# Patient Record
Sex: Female | Born: 1965 | Race: Black or African American | Hispanic: No | Marital: Married | Smoking: Never smoker
Health system: Southern US, Community
[De-identification: ages and names within clinical notes are randomized; demographics above are authoritative.]

## PROBLEM LIST (undated history)

## (undated) ENCOUNTER — Ambulatory Visit: Admission: EM | Payer: Federal, State, Local not specified - PPO | Source: Home / Self Care

## (undated) DIAGNOSIS — J45909 Unspecified asthma, uncomplicated: Secondary | ICD-10-CM

## (undated) DIAGNOSIS — Z91048 Other nonmedicinal substance allergy status: Secondary | ICD-10-CM

## (undated) DIAGNOSIS — L2089 Other atopic dermatitis: Secondary | ICD-10-CM

## (undated) DIAGNOSIS — K589 Irritable bowel syndrome without diarrhea: Secondary | ICD-10-CM

## (undated) DIAGNOSIS — M797 Fibromyalgia: Secondary | ICD-10-CM

## (undated) DIAGNOSIS — M1990 Unspecified osteoarthritis, unspecified site: Secondary | ICD-10-CM

## (undated) DIAGNOSIS — R609 Edema, unspecified: Secondary | ICD-10-CM

## (undated) HISTORY — DX: Other atopic dermatitis: L20.89

## (undated) HISTORY — DX: Unspecified osteoarthritis, unspecified site: M19.90

## (undated) HISTORY — PX: TONSILLECTOMY AND ADENOIDECTOMY: SUR1326

## (undated) HISTORY — DX: Unspecified asthma, uncomplicated: J45.909

## (undated) HISTORY — PX: WISDOM TOOTH EXTRACTION: SHX21

## (undated) HISTORY — DX: Edema, unspecified: R60.9

## (undated) HISTORY — DX: Irritable bowel syndrome, unspecified: K58.9

## (undated) HISTORY — DX: Morbid (severe) obesity due to excess calories: E66.01

## (undated) HISTORY — DX: Fibromyalgia: M79.7

---

## 1997-10-30 ENCOUNTER — Inpatient Hospital Stay (HOSPITAL_COMMUNITY): Admission: AD | Admit: 1997-10-30 | Discharge: 1997-11-01 | Payer: Self-pay | Admitting: Obstetrics and Gynecology

## 1998-04-12 ENCOUNTER — Encounter: Admission: RE | Admit: 1998-04-12 | Discharge: 1998-07-11 | Payer: Self-pay | Admitting: Orthopedic Surgery

## 1999-05-10 ENCOUNTER — Other Ambulatory Visit: Admission: RE | Admit: 1999-05-10 | Discharge: 1999-05-10 | Payer: Self-pay | Admitting: Gynecology

## 2000-04-09 ENCOUNTER — Other Ambulatory Visit: Admission: RE | Admit: 2000-04-09 | Discharge: 2000-04-09 | Payer: Self-pay | Admitting: Gynecology

## 2001-05-02 ENCOUNTER — Emergency Department (HOSPITAL_COMMUNITY): Admission: EM | Admit: 2001-05-02 | Discharge: 2001-05-02 | Payer: Self-pay | Admitting: Emergency Medicine

## 2008-04-15 DIAGNOSIS — E559 Vitamin D deficiency, unspecified: Secondary | ICD-10-CM | POA: Insufficient documentation

## 2008-09-16 ENCOUNTER — Ambulatory Visit: Payer: Self-pay | Admitting: Family Medicine

## 2008-10-13 ENCOUNTER — Ambulatory Visit: Payer: Self-pay | Admitting: Family Medicine

## 2008-10-26 ENCOUNTER — Telehealth: Payer: Self-pay | Admitting: Family Medicine

## 2009-03-24 ENCOUNTER — Ambulatory Visit: Payer: Self-pay | Admitting: Family Medicine

## 2009-03-24 LAB — CONVERTED CEMR LAB
Nitrite: NEGATIVE
Urobilinogen, UA: 0.2

## 2009-03-28 ENCOUNTER — Encounter (INDEPENDENT_AMBULATORY_CARE_PROVIDER_SITE_OTHER): Payer: Self-pay | Admitting: *Deleted

## 2009-04-07 ENCOUNTER — Ambulatory Visit: Payer: Self-pay | Admitting: Family Medicine

## 2009-08-07 ENCOUNTER — Ambulatory Visit: Payer: Self-pay | Admitting: Emergency Medicine

## 2009-09-21 ENCOUNTER — Ambulatory Visit: Payer: Self-pay | Admitting: Obstetrics & Gynecology

## 2009-10-20 ENCOUNTER — Encounter: Admission: RE | Admit: 2009-10-20 | Discharge: 2009-10-20 | Payer: Self-pay | Admitting: Obstetrics & Gynecology

## 2009-11-07 ENCOUNTER — Ambulatory Visit: Payer: Self-pay | Admitting: Emergency Medicine

## 2009-11-08 ENCOUNTER — Ambulatory Visit: Payer: Self-pay | Admitting: Obstetrics & Gynecology

## 2010-02-09 ENCOUNTER — Ambulatory Visit: Payer: Self-pay | Admitting: Emergency Medicine

## 2010-02-09 DIAGNOSIS — L2089 Other atopic dermatitis: Secondary | ICD-10-CM

## 2010-02-09 HISTORY — DX: Other atopic dermatitis: L20.89

## 2010-03-12 ENCOUNTER — Encounter: Payer: Self-pay | Admitting: Obstetrics & Gynecology

## 2010-03-13 ENCOUNTER — Ambulatory Visit
Admission: RE | Admit: 2010-03-13 | Discharge: 2010-03-13 | Payer: Self-pay | Source: Home / Self Care | Admitting: Family Medicine

## 2010-03-22 ENCOUNTER — Ambulatory Visit (INDEPENDENT_AMBULATORY_CARE_PROVIDER_SITE_OTHER): Payer: Self-pay | Admitting: Emergency Medicine

## 2010-03-22 ENCOUNTER — Encounter: Payer: Self-pay | Admitting: Emergency Medicine

## 2010-03-22 DIAGNOSIS — R609 Edema, unspecified: Secondary | ICD-10-CM

## 2010-03-22 HISTORY — DX: Edema, unspecified: R60.9

## 2010-03-23 ENCOUNTER — Encounter: Payer: Self-pay | Admitting: Emergency Medicine

## 2010-03-23 LAB — CONVERTED CEMR LAB
BUN: 7 mg/dL (ref 6–23)
Glucose, Bld: 160 mg/dL — ABNORMAL HIGH (ref 70–99)
Potassium: 4.2 meq/L (ref 3.5–5.3)
Sodium: 138 meq/L (ref 135–145)

## 2010-03-23 NOTE — Letter (Signed)
Summary: New Patient letter  [facility]  [address] [place], [place] [address]   Phone: [phone]  Fax: [phone]       03/28/2009 MRN: [id number]  [facility] [address] [place], [place]  [address]  Dear Ms. Mayford Knife,  Welcome to the Gastroenterology Division at [facility].    You are scheduled to see Dr.  Marina Goodell on 05-16-09 at 2:45pm on the 3rd floor at [facility], [address]  We ask that you try to arrive at our office 15 minutes prior to your appointment time to allow for check-in.  We would like you to complete the enclosed self-administered evaluation form prior to your visit and bring it with you on the day of your appointment.  We will review it with you.  Also, please bring a complete list of all your medications or, if you prefer, bring the medication bottles and we will list them.  Please bring your insurance card so that we may make a copy of it.  If your insurance requires a referral to see a specialist, please bring your referral form from your primary care physician.  Co-payments are due at the time of your visit and may be paid by cash, check or credit card.     Your office visit will consist of a consult with your physician (includes a physical exam), any laboratory testing he/she may order, scheduling of any necessary diagnostic testing (e.g. x-ray, ultrasound, CT-scan), and scheduling of a procedure (e.g. Endoscopy, Colonoscopy) if required.  Please allow enough time on your schedule to allow for any/all of these possibilities.    If you cannot keep your appointment, please call [phone] to cancel or reschedule prior to your appointment date.  This allows [place] the opportunity to schedule an appointment for another patient in need of care.  If you do not cancel or reschedule by 5 p.m. the business day prior to your appointment date, you will be charged a $50.00 late cancellation/no-show fee.    Thank you for  choosing [facility] for your medical needs.  We appreciate the opportunity to care for you.  Please visit [place] at our website  to learn more about our practice.                     Sincerely,                                                             The Gastroenterology Division

## 2010-03-23 NOTE — Letter (Signed)
Summary: MEDICATION PRESCRIPTION POLICY  MEDICATION PRESCRIPTION POLICY   Imported By: Dannette Barbara 11/07/2009 15:18:43  _____________________________________________________________________  External Attachment:    Type:   Image     Comment:   External Document

## 2010-03-23 NOTE — Assessment & Plan Note (Signed)
Summary: SWOLLEN FEET/TJ   Vital Signs:  Patient Profile:   45 Years Old Female CC:      edematous feet, itching, rash to face x last night Height:     62 inches Weight:      245 pounds O2 Sat:      100 % O2 treatment:    Room Air Temp:     98.4 degrees F oral Pulse rate:   110 / minute Resp:     16 per minute BP sitting:   138 / 93  (left arm) Cuff size:   large  Pt. in pain?   no  Vitals Entered By: Lajean Saver RN (February 09, 2010 5:21 PM)                   Updated Prior Medication List: NEURONTIN 300 MG CAPS (GABAPENTIN) 2 tabs by mouth once daily FLEXERIL 10 MG TABS (CYCLOBENZAPRINE HCL)  FLONASE 50 MCG/ACT SUSP (FLUTICASONE PROPIONATE)  ZYRTEC-D ALLERGY & CONGESTION 5-120 MG XR12H-TAB (CETIRIZINE-PSEUDOEPHEDRINE) 1 tab by mouth two times a day for 3-5 days OXYCODONE HCL 15 MG TABS (OXYCODONE HCL) 4 xs daily  Current Allergies (reviewed today): ! * MOLD, RAGWEEDHistory of Present Illness Chief Complaint: edematous feet, itching, rash to face x last night History of Present Illness: 44yo AAF c/o bilateral feet swelling for a few days, itching whole body, and red rash on face x1 day.  She has a new perfume, and also started Oxycodone last week per her pain med physician.  She was on Hydrocodone prior to that, but it wasn't helping for her hip pain and fibromyalgia.  No SOB or difficulty breathing, no CP or orthopnea.  No other swelling other than feet.  Benedryl help a little bit.  She has also noticed a metallic taste in her mouth, but admits to chronic sinus problems.    REVIEW OF SYSTEMS Constitutional Symptoms      Denies fever, chills, night sweats, weight loss, weight gain, and fatigue.  Eyes       Denies change in vision, eye pain, eye discharge, glasses, contact lenses, and eye surgery. Ear/Nose/Throat/Mouth       Denies hearing loss/aids, change in hearing, ear pain, ear discharge, dizziness, frequent runny nose, frequent nose bleeds, sinus problems, sore  throat, hoarseness, and tooth pain or bleeding.      Comments: metal taste in mouth Respiratory       Denies dry cough, productive cough, wheezing, shortness of breath, asthma, bronchitis, and emphysema/COPD.  Cardiovascular       Denies murmurs, chest pain, and tires easily with exhertion.    Gastrointestinal       Denies stomach pain, nausea/vomiting, diarrhea, constipation, blood in bowel movements, and indigestion. Genitourniary       Denies painful urination, kidney stones, and loss of urinary control. Neurological       Denies paralysis, seizures, and fainting/blackouts. Musculoskeletal       Complains of swelling.      Denies muscle pain, joint pain, joint stiffness, decreased range of motion, redness, muscle weakness, and gout.      Comments: feet/ankles Skin       Denies bruising, unusual mles/lumps or sores, and hair/skin or nail changes.      Comments: face Psych       Denies mood changes, temper/anger issues, anxiety/stress, speech problems, depression, and sleep problems. Other Comments: Patient c/o swelling feet, metal taste in mouth, itching all over with small rash on her face x last  night. SHe has taken benadryl without much relief. She started taking oxycodone 1 week ago.   Past History:  Past Medical History: Reviewed history from 09/16/2008 and no changes required. Chronic Fatigue Fibromyalgia  Past Surgical History: Reviewed history from 11/07/2009 and no changes required. Caesarean section Tonsillectomy  Family History: Reviewed history from 09/16/2008 and no changes required. Family History Diabetes 1st degree relative  Social History: Married Never Smoked Alcohol use-no Drug use-no Regular exercise-yes 2 children Physical Exam General appearance: well developed, obese, no acute distress Ears: normal, no lesions or deformities Nasal: clear discharge Oral/Pharynx: clear post nasal drip, no swelling, OP patent, no erythema Chest/Lungs: no rales,  wheezes, or rhonchi bilateral, breath sounds equal without effort Heart: regular rate and  rhythm, no murmur Extremities: mild non-pitting edema bilaterally to ankles, no tenderness, no other swelling seen, however with body habitus sometimes the visualization becomes difficult to differentiate Skin: mild red small hives around both ears and a few excorations on face MSE: oriented to time, place, and person Assessment New Problems: DERMATITIS, ATOPIC (ICD-691.8)  Possible allergic reaction to new perfume, vs possible reaction to the oxycodone  Plan New Medications/Changes: PREDNISONE (PAK) 10 MG TABS (PREDNISONE) 6 day pack, use as directed  #QS x 0, 02/09/2010, Hoyt Koch MD  New Orders: Est. Patient Level III [phone] Planning Comments:   Rx for Prednisone 6 day pack.  Continue the benedryl.  I have advised her to stop using the perfume and instead use hypoallergenic soaps and lotions.  Mild 1% OTC cortisone daily if needed this week for itching.  If not improving, should follow up with her pain mgmt doctor to discuss switching off of Oxy.  The metallic taste is likely due to the post-nasal drip and should not be concerning unless it continues or worsens.  Use compression stockings to decrease swelling, and elevate feet whenever possible.  If SOB or any difficulty breathing or worsening of symptoms, go to ER or call EMS.   The patient and/or caregiver has been counseled thoroughly with regard to medications prescribed including dosage, schedule, interactions, rationale for use, and possible side effects and they verbalize understanding.  Diagnoses and expected course of recovery discussed and will return if not improved as expected or if the condition worsens. Patient and/or caregiver verbalized understanding.  Prescriptions: PREDNISONE (PAK) 10 MG TABS (PREDNISONE) 6 day pack, use as directed  #QS x 0   Entered and Authorized by:   Hoyt Koch MD   Signed by:   Hoyt Koch MD on 02/09/2010   Method used:   Print then Give to Patient   RxID:   [id number]   Orders Added: 1)  Est. Patient Level III [[address]]

## 2010-03-23 NOTE — Assessment & Plan Note (Signed)
Summary: RASH ON BACK AND RT LEG/WSE Room 5   Vital Signs:  Patient Profile:   45 Years Old Female CC:      Fatigue, ear pain, cough, body aches, rash on lower back x 1 wk rt leg x 2 days Height:     62 inches Weight:      245 pounds O2 Sat:      100 % O2 treatment:    Room Air Temp:     99.1 degrees F oral Pulse rate:   96 / minute Pulse rhythm:   regular Resp:     16 per minute BP sitting:   147 / 89  (left arm) Cuff size:   large  Vitals Entered By: Emilio Math (March 13, 2010 5:46 PM)                  Current Allergies (reviewed today): ! * MOLD, RAGWEEDHistory of Present Illness Chief Complaint: Fatigue, ear pain, cough, body aches, rash on lower back x 1 wk rt leg x 2 days History of Present Illness:  Subjective: Patient complains of URI symptoms that started about a week ago with sinus congestion, mild sore throat, and cough.  Cough is worse at night.  She also complains of a small pruritic lesion on her back that is now healing, and a similar but smaller lesion on her right anterior thigh that itches but seems to be healing.    No pleuritic pain No wheezing + nasal congestion + post-nasal drainage No sinus pain/pressure No itchy/red eyes No earache but ears feel clogged No hemoptysis No SOB No fever, + chills No nausea No vomiting No abdominal pain No diarrhea + fatigue No myalgias No headache Used OTC meds without relief   Current Meds FLEXERIL 10 MG TABS (CYCLOBENZAPRINE HCL)  HYDROCODONE-ACETAMINOPHEN 5-500 MG TABS (HYDROCODONE-ACETAMINOPHEN)  MOBIC 15 MG TABS (MELOXICAM)  AZITHROMYCIN 250 MG TABS (AZITHROMYCIN) Two tabs by mouth on day 1, then 1 tab daily on days 2 through 5 (Rx void after 03/21/10) BENZONATATE 200 MG CAPS (BENZONATATE) One by mouth hs as needed cough TRIAMCINOLONE ACETONIDE 0.1 % CREA (TRIAMCINOLONE ACETONIDE) Apply thin layer to affected area bid  REVIEW OF SYSTEMS Constitutional Symptoms      Denies fever, chills, night  sweats, weight loss, weight gain, and fatigue.  Eyes       Denies change in vision, eye pain, eye discharge, glasses, contact lenses, and eye surgery. Ear/Nose/Throat/Mouth       Complains of ear pain and sinus problems.      Denies hearing loss/aids, change in hearing, ear discharge, dizziness, frequent runny nose, frequent nose bleeds, sore throat, hoarseness, and tooth pain or bleeding.  Respiratory       Complains of dry cough.      Denies productive cough, wheezing, shortness of breath, asthma, bronchitis, and emphysema/COPD.  Cardiovascular       Denies murmurs, chest pain, and tires easily with exhertion.    Gastrointestinal       Denies stomach pain, nausea/vomiting, diarrhea, constipation, blood in bowel movements, and indigestion. Genitourniary       Denies painful urination, kidney stones, and loss of urinary control. Neurological       Denies paralysis, seizures, and fainting/blackouts. Musculoskeletal       Complains of muscle pain and joint pain.      Denies joint stiffness, decreased range of motion, redness, swelling, muscle weakness, and gout.  Skin       Denies bruising, unusual mles/lumps  or sores, and hair/skin or nail changes.  Psych       Denies mood changes, temper/anger issues, anxiety/stress, speech problems, depression, and sleep problems.  Past History:  Past Medical History: Reviewed history from 09/16/2008 and no changes required. Chronic Fatigue Fibromyalgia  Past Surgical History: Reviewed history from 11/07/2009 and no changes required. Caesarean section Tonsillectomy  Family History: Reviewed history from 09/16/2008 and no changes required. Family History Diabetes 1st degree relative Father, High Chol  Social History: Reviewed history from 02/09/2010 and no changes required. Married Never Smoked Alcohol use-no Drug use-no Regular exercise-yes 2 children   Objective:  Appearance:  Patient is obest but otherwise appears healthy, stated  age, and in no acute distress  Eyes:  Pupils are equal, round, and reactive to light and accomdation.  Extraocular movement is intact.  Conjunctivae are not inflamed.  Ears:  Canals normal.  Tympanic membranes normal.   Nose:  Normal septum.  Normal turbinates, mildly congested.    No sinus tenderness present.  Pharynx:  Normal  Neck:  Supple.  Slightly tender shotty  posterior nodes are palpated bilaterally.  Lungs:  Clear to auscultation.  Breath sounds are equal.  Heart:  Regular rate and rhythm without murmurs, rubs, or gallops.  Abdomen:  Nontender without masses or hepatosplenomegaly.  Bowel sounds are present.  No CVA or flank tenderness.  Extremities:  Trace edema Skin:  On the mid lower back is a healing excoriation about 5mm by 1.5cm, not infected.  On the right anterior thigh is a 5mm dia healing excoriation without evidence infection.  Both lesions appear non-specific. Assessment New Problems: SKIN LESION (ICD-709.9) RESPIRATORY DISORDER, ACUTE (ICD-465.9)  NO EVIDENCE BACTERIAL INFECTION TODAY.  Plan New Medications/Changes: TRIAMCINOLONE ACETONIDE 0.1 % CREA (TRIAMCINOLONE ACETONIDE) Apply thin layer to affected area bid  #15gm x 0, 03/13/2010, Donna Christen MD BENZONATATE 200 MG CAPS (BENZONATATE) One by mouth hs as needed cough  #12 x 0, 03/13/2010, Donna Christen MD AZITHROMYCIN 250 MG TABS (AZITHROMYCIN) Two tabs by mouth on day 1, then 1 tab daily on days 2 through 5 (Rx void after 03/21/10)  #6 tabs x 0, 03/13/2010, Donna Christen MD  New Orders: Est. Patient Level III [[address]] Pulse Oximetry (single measurment) [60[address]] Planning Comments:   Treat symptomatically for now:  Increase fluid intake, begin expectorant/decongestant, cough suppressant at bedtime.  If fever/chills/sweats persist, or if not improving 5 days begin Z-pack (given Rx to hold).  Followup with PCP if not improving 7 to 10 days.   Rx for triamcinolone cream for 2 skin lesions; if lesions persist or new  ones occur, recommend follow-up with dermatologist.   The patient and/or caregiver has been counseled thoroughly with regard to medications prescribed including dosage, schedule, interactions, rationale for use, and possible side effects and they verbalize understanding.  Diagnoses and expected course of recovery discussed and will return if not improved as expected or if the condition worsens. Patient and/or caregiver verbalized understanding.  Prescriptions: TRIAMCINOLONE ACETONIDE 0.1 % CREA (TRIAMCINOLONE ACETONIDE) Apply thin layer to affected area bid  #15gm x 0   Entered and Authorized by:   Donna Christen MD   Signed by:   Donna Christen MD on 03/13/2010   Method used:   Print then Give to Patient   RxID:   [id number] BENZONATATE 200 MG CAPS (BENZONATATE) One by mouth hs as needed cough  #12 x 0   Entered and Authorized by:   Donna Christen MD   Signed by:   Jeannett Senior  Mertie Haslem MD on 03/13/2010   Method used:   Print then Give to Patient   RxID:   [id number] AZITHROMYCIN 250 MG TABS (AZITHROMYCIN) Two tabs by mouth on day 1, then 1 tab daily on days 2 through 5 (Rx void after 03/21/10)  #6 tabs x 0   Entered and Authorized by:   Donna Christen MD   Signed by:   Donna Christen MD on 03/13/2010   Method used:   Print then Give to Patient   RxID:   [id number]   Patient Instructions: 1)  Take Mucinex D (guaifenesin with decongestant) twice daily for congestion. 2)  Increase fluid intake, rest. 3)  May use Afrin nasal spray (or generic oxymetazoline) twice daily for about 5 days.  Also recommend using saline nasal spray several times daily and/or saline nasal irrigation. 4)  Use Vicks at night. 5)  Begin Azithromycin if not improved about 5 days or if persistent fever develops 6)  Followup with family doctor if not improving 7 to 10 days.  Orders Added: 1)  Est. Patient Level III [[address]] 2)  Pulse Oximetry (single measurment) [13244]

## 2010-03-23 NOTE — Assessment & Plan Note (Signed)
Summary: SINUS,BACK PAIN/TM   Vital Signs:  Patient Profile:   45 Years Old Female CC:      bilateral ear pain, sinus congestion X 1 week ,  back pain Height:     62 inches Weight:      237 pounds O2 Sat:      99 % O2 treatment:    Room Air Temp:     97.1 degrees F oral Pulse rate:   97 / minute Resp:     16 per minute BP sitting:   133 / 89  (right arm) Cuff size:   large  Pt. in pain?   yes    Location:   lower back    Intensity:   9    Type:       sharp  Vitals Entered By: Lajean Saver RN (August 07, 2009 4:35 PM)                   Updated Prior Medication List: NEURONTIN 300 MG CAPS (GABAPENTIN) 2 tabs by mouth once daily TRAMADOL HCL 50 MG TABS (TRAMADOL HCL) 2 tabs by mouth once daily CELEBREX 200 MG CAPS (CELECOXIB) 1 tab by mouth once daily HYDROCODONE-ACETAMINOPHEN 5-500 MG TABS (HYDROCODONE-ACETAMINOPHEN) 2 tab by mouth once daily  Current Allergies (reviewed today): No known allergies History of Present Illness Chief Complaint: bilateral ear pain, sinus congestion X 1 week ,  back pain History of Present Illness: 45yo AAF with above symptoms.  Her daughter has felt sick too.  R>L ear pain, sinus congestion.  no fever.  Also had yard sale yesterday and now has worsening back  pain (h/o fibromyalgia and already on Flexeril and Vicodin).  Right sided lumbar back with no radiation. No B/B dysfunction. No weakness.  REVIEW OF SYSTEMS Constitutional Symptoms      Denies fever, chills, night sweats, weight loss, weight gain, and fatigue.  Eyes       Denies change in vision, eye pain, eye discharge, glasses, contact lenses, and eye surgery. Ear/Nose/Throat/Mouth       Complains of ear pain and sinus problems.      Denies hearing loss/aids, change in hearing, ear discharge, dizziness, frequent runny nose, frequent nose bleeds, sore throat, hoarseness, and tooth pain or bleeding.  Respiratory       Denies dry cough, productive cough, wheezing, shortness of breath,  asthma, bronchitis, and emphysema/COPD.  Cardiovascular       Denies murmurs, chest pain, and tires easily with exhertion.    Gastrointestinal       Denies stomach pain, nausea/vomiting, diarrhea, constipation, blood in bowel movements, and indigestion. Genitourniary       Denies painful urination, kidney stones, and loss of urinary control. Neurological       Denies paralysis, seizures, and fainting/blackouts. Musculoskeletal       Complains of muscle pain.      Denies joint pain, joint stiffness, decreased range of motion, redness, swelling, muscle weakness, and gout.      Comments: lower right back Skin       Denies bruising, unusual mles/lumps or sores, and hair/skin or nail changes.  Psych       Denies mood changes, temper/anger issues, anxiety/stress, speech problems, depression, and sleep problems.  Past History:  Past Medical History: Reviewed history from 09/16/2008 and no changes required. Chronic Fatigue Fibromyalgia  Past Surgical History: Reviewed history from 09/16/2008 and no changes required. Caesarean section  Family History: Reviewed history from 09/16/2008 and no changes required. Family  History Diabetes 1st degree relative  Social History: Reviewed history from 09/16/2008 and no changes required. Married Never Smoked Alcohol use-no Drug use-no Regular exercise-yes Physical Exam General appearance: well developed, well nourished, no acute distress Ears: Right TM erythema Nasal: mucosa pink, nonedematous, no septal deviation, turbinates normal Oral/Pharynx: tongue normal, posterior pharynx without erythema or exudate Neck: neck supple,  trachea midline, no masses Heart: regular rate and  rhythm, no murmur Abdomen: soft, non-tender without obvious organomegaly Neurological: grossly intact and non-focal Back: Right paraspinal tenderness and spasm, no bony tenderness, FROM Assessment New Problems: OTITIS MEDIA, ACUTE (ICD-382.9) BACK STRAIN, LUMBAR  (ICD-847.2)   Plan New Medications/Changes: TRAMADOL HCL 50 MG TABS (TRAMADOL HCL) 1 tab by mouth Q6 hours as needed pain  #20 x 0, 08/07/2009, Hoyt Koch MD AUGMENTIN [phone] MG TABS (AMOXICILLIN-POT CLAVULANATE) 1 tab by mouth two times a day x7 days  #14 x 0, 08/07/2009, Hoyt Koch MD  New Orders: New Patient Level III [phone]  The patient and/or caregiver has been counseled thoroughly with regard to medications prescribed including dosage, schedule, interactions, rationale for use, and possible side effects and they verbalize understanding.  Diagnoses and expected course of recovery discussed and will return if not improved as expected or if the condition worsens. Patient and/or caregiver verbalized understanding.  Prescriptions: TRAMADOL HCL 50 MG TABS (TRAMADOL HCL) 1 tab by mouth Q6 hours as needed pain  #20 x 0   Entered and Authorized by:   Hoyt Koch MD   Signed by:   Hoyt Koch MD on 08/07/2009   Method used:   Handwritten   RxID:   [id number] AUGMENTIN 875-125 MG TABS (AMOXICILLIN-POT CLAVULANATE) 1 tab by mouth two times a day x7 days  #14 x 0   Entered and Authorized by:   Hoyt Koch MD   Signed by:   Hoyt Koch MD on 08/07/2009   Method used:   Handwritten   RxID:   [id number]   Patient Instructions: 1)  Take meds as prescribed 2)  Follow-up with your primary care physician if not improving 3)  Gentle stretching, heating pad, no heavy lifting for a few days 4)  OTC cough & cold medicines  Orders Added: 1)  New Patient Level III [31497]

## 2010-03-23 NOTE — Assessment & Plan Note (Signed)
Summary: EAR PAIN,RUNNY NOSE,SINUS PROBLEMS,COUGH,SORE THROAT/TJ   Vital Signs:  Patient Profile:   45 Years Old Female CC:      Face hurts, runny nose, congestion, ears hurt x 5 days Height:     62 inches Weight:      223 pounds O2 Sat:      100 % O2 treatment:    Room Air Temp:     98.0 degrees F oral Pulse rate:   88 / minute Pulse rhythm:   regular Resp:     18 per minute BP sitting:   138 / 79  (right arm) Cuff size:   regular  Vitals Entered By: Avel Sensor, CMA                  Current Allergies: ! * MOLD, RAGWEEDHistory of Present Illness History from: patient Chief Complaint: Face hurts, runny nose, congestion, ears hurt x 5 days History of Present Illness: Sinus pressure, facial pain, runny nose, nasal congestion, ear clogged for the past 5 days.  History of allergies and sinus infections.  No F/C.  Using Benedryl, Zyrtec, and Flonase which helps a little bit.  Current Meds NEURONTIN 300 MG CAPS (GABAPENTIN) 2 tabs by mouth once daily HYDROCODONE-ACETAMINOPHEN 5-500 MG TABS (HYDROCODONE-ACETAMINOPHEN) 2 tab by mouth once daily FLEXERIL 10 MG TABS (CYCLOBENZAPRINE HCL)  FLONASE 50 MCG/ACT SUSP (FLUTICASONE PROPIONATE)  AMOXICILLIN 875 MG TABS (AMOXICILLIN) 1 tab by mouth two times a day for 10 days ZYRTEC-D ALLERGY & CONGESTION 5-120 MG XR12H-TAB (CETIRIZINE-PSEUDOEPHEDRINE) 1 tab by mouth two times a day for 3-5 days  REVIEW OF SYSTEMS Constitutional Symptoms      Denies fever, chills, night sweats, weight loss, weight gain, and fatigue.  Eyes       Denies change in vision, eye pain, eye discharge, glasses, contact lenses, and eye surgery. Ear/Nose/Throat/Mouth       Complains of ear pain, frequent runny nose, sinus problems, sore throat, and hoarseness.      Denies hearing loss/aids, change in hearing, ear discharge, dizziness, frequent nose bleeds, and tooth pain or bleeding.  Respiratory       Complains of dry cough.      Denies productive cough, wheezing,  shortness of breath, asthma, bronchitis, and emphysema/COPD.  Cardiovascular       Denies murmurs, chest pain, and tires easily with exhertion.    Gastrointestinal       Denies stomach pain, nausea/vomiting, diarrhea, constipation, blood in bowel movements, and indigestion. Genitourniary       Denies painful urination, kidney stones, and loss of urinary control. Neurological       Denies paralysis, seizures, and fainting/blackouts. Musculoskeletal       Complains of muscle pain.      Denies joint pain, joint stiffness, decreased range of motion, redness, swelling, muscle weakness, and gout.  Skin       Denies bruising, unusual mles/lumps or sores, and hair/skin or nail changes.  Psych       Denies mood changes, temper/anger issues, anxiety/stress, speech problems, depression, and sleep problems.  Past History:  Past Medical History: Reviewed history from 09/16/2008 and no changes required. Chronic Fatigue Fibromyalgia  Past Surgical History: Caesarean section Tonsillectomy  Family History: Reviewed history from 09/16/2008 and no changes required. Family History Diabetes 1st degree relative  Social History: Reviewed history from 09/16/2008 and no changes required. Married Never Smoked Alcohol use-no Drug use-no Regular exercise-yes Physical Exam General appearance: well developed, well nourished, no acute distress Head: maxillary  sinus tenderness Ears: normal, no lesions or deformities Nasal: mucosa pink, nonedematous, no septal deviation, turbinates normal Oral/Pharynx: tongue normal, posterior pharynx without erythema or exudate Chest/Lungs: no rales, wheezes, or rhonchi bilateral, breath sounds equal without effort Heart: regular rate and  rhythm, no murmur Skin: no obvious rashes or lesions MSE: oriented to time, place, and person Assessment New Problems: ACUTE SINUSITIS, UNSPECIFIED (ICD-461.9)   Patient Education: Patient and/or caregiver instructed in the  following: rest, fluids, Ibuprofen prn.  Plan New Medications/Changes: ZYRTEC-D ALLERGY & CONGESTION 5-120 MG XR12H-TAB (CETIRIZINE-PSEUDOEPHEDRINE) 1 tab by mouth two times a day for 3-5 days  #10 x 0, 11/07/2009, Hoyt Koch MD AMOXICILLIN 875 MG TABS (AMOXICILLIN) 1 tab by mouth two times a day for 10 days  #20 x 0, 11/07/2009, Hoyt Koch MD  New Orders: Est. Patient Level II [phone] Planning Comments:   Follow-up with your primary care physician if not improving in 7-10 days Continue Flonase   The patient and/or caregiver has been counseled thoroughly with regard to medications prescribed including dosage, schedule, interactions, rationale for use, and possible side effects and they verbalize understanding.  Diagnoses and expected course of recovery discussed and will return if not improved as expected or if the condition worsens. Patient and/or caregiver verbalized understanding.  Prescriptions: ZYRTEC-D ALLERGY & CONGESTION 5-120 MG XR12H-TAB (CETIRIZINE-PSEUDOEPHEDRINE) 1 tab by mouth two times a day for 3-5 days  #10 x 0   Entered and Authorized by:   Hoyt Koch MD   Signed by:   Hoyt Koch MD on 11/07/2009   Method used:   Printed then faxed to ...       [address] [phone]* (retail)       [address] [place], [place]  [address]       Ph: [phone]       Fax: [phone]   RxID:   [id number] AMOXICILLIN 875 MG TABS (AMOXICILLIN) 1 tab by mouth two times a day for 10 days  #20 x 0   Entered and Authorized by:   Hoyt Koch MD   Signed by:   Hoyt Koch MD on 11/07/2009   Method used:   Printed then faxed to ...       [address] [phone]* (retail)       [address] [place], [place]  [address]       Ph: [phone]       Fax: [phone]   RxID:   [id number]   Orders Added: 1)  Est. Patient Level II [10932]

## 2010-03-23 NOTE — Assessment & Plan Note (Signed)
Summary: PAIN UNDER R RIBCAGE/KH   Vital Signs:  Patient Profile:   45 Years Old Female CC:      pain to right rib and back, cough X 1 week Height:     62 inches O2 Sat:      100 % O2 treatment:    Room Air Temp:     98.0 degrees F oral Pulse rate:   101 / minute Pulse rhythm:   regular Resp:     18 per minute BP sitting:   124 / 85  (right arm) Cuff size:   regular  Pt. in pain?   yes    Location:   right side radiating to back    Intensity:   7    Type:       sharp  Vitals Entered By: Lajean Saver, RN                   Updated Prior Medication List: NEURONTIN 300 MG CAPS (GABAPENTIN) 2 tabs by mouth once daily TRAMADOL HCL 50 MG TABS (TRAMADOL HCL) 2 tabs by mouth once daily CELEBREX 200 MG CAPS (CELECOXIB) 1 tab by mouth once daily HYDROCODONE-ACETAMINOPHEN 5-500 MG TABS (HYDROCODONE-ACETAMINOPHEN) 2 tab by mouth once daily  Current Allergies (reviewed today): No known allergies History of Present Illness Chief Complaint: pain to right rib and back, cough X 1 week History of Present Illness: ONSET 4-5 DAYS AGO. NO INJURY. PAIN WORSE WITH MOVEMENT. LIFTING 45 LB DAUGHTER. NO UTI SYMPTOMS, DENIES RASH . ALSO HAS LEFT SIDED SORE THROAT AND PAIN TO RIGHT EAR WITH SWALLOWING. NO FEVER, NO RUNNY NOSE NO COUGH.   REVIEW OF SYSTEMS Constitutional Symptoms      Denies fever, chills, night sweats, weight loss, weight gain, and fatigue.  Eyes       Denies change in vision, eye pain, eye discharge, glasses, contact lenses, and eye surgery. Ear/Nose/Throat/Mouth       Complains of sore throat.      Denies hearing loss/aids, change in hearing, ear pain, ear discharge, dizziness, frequent runny nose, frequent nose bleeds, sinus problems, hoarseness, and tooth pain or bleeding.      Comments: hard to swallow Respiratory       Complains of dry cough.      Denies productive cough, wheezing, shortness of breath, asthma, bronchitis, and emphysema/COPD.  Cardiovascular       Denies  murmurs, chest pain, and tires easily with exhertion.    Gastrointestinal       Denies stomach pain, nausea/vomiting, diarrhea, constipation, blood in bowel movements, and indigestion. Genitourniary       Denies painful urination, kidney stones, and loss of urinary control. Neurological       Denies paralysis, seizures, and fainting/blackouts. Musculoskeletal       Complains of muscle pain and joint pain.      Denies joint stiffness, decreased range of motion, redness, swelling, muscle weakness, and gout.      Comments: right side radiating to back Skin       Denies bruising, unusual mles/lumps or sores, and hair/skin or nail changes.  Psych       Denies mood changes, temper/anger issues, anxiety/stress, speech problems, depression, and sleep problems.  Past History:  Past Medical History: Reviewed history from 09/16/2008 and no changes required. Chronic Fatigue Fibromyalgia  Past Surgical History: Reviewed history from 09/16/2008 and no changes required. Caesarean section  Family History: Reviewed history from 09/16/2008 and no changes required. Family History Diabetes 1st degree  relative  Social History: Reviewed history from 09/16/2008 and no changes required. Married Never Smoked Alcohol use-no Drug use-no Regular exercise-yes Physical Exam General appearance: well developed, well nourished, no acute distress Ears: normal, no lesions or deformities Nasal: mucosa pink, nonedematous, no septal deviation, turbinates normal Oral/Pharynx: MILD ERYTHEMA Neck: neck supple,  trachea midline, no masses Chest/Lungs: no rales, wheezes, or rhonchi bilateral, breath sounds equal without effort Heart: regular rate and  rhythm, no murmur Abdomen: soft, non-tender without obvious organomegaly Back: TENDER RIGHT POST FLANK TO PALPATION. SKIN CLEAR, PAIN WITH TWISTING MOVEMENT AND MOTION OF RIGHT UPPER ESTREMITY. NO PAIN WITH DEEP BREATHING.  Assessment New Problems: BACK PAIN  (ICD-724.5) PHARYNGITIS, ACUTE (ICD-462.0)   Plan New Medications/Changes: AMOXICILLIN 500 MG CAPS (AMOXICILLIN) 1 by mouth TID  #30 x 0, 04/07/2009, Marvis Moeller DO  New Orders: Est. Patient Level III [16109]   Prescriptions: AMOXICILLIN 500 MG CAPS (AMOXICILLIN) 1 by mouth TID  #30 x 0   Entered and Authorized by:   Marvis Moeller DO   Signed by:   Marvis Moeller DO on 04/07/2009   Method used:   Print then Give to Patient   RxID:   [id number]   Patient Instructions: 1)  APPLY HEAT THREE TIMES DAILY FOR 15 MIN. THROAT SPRAY OF CHOICE. MUCINEX RECOMMENDED. FOLLOW UP WITH YOUR PCP IF SYMPTOMS PERSIST OR WORSEN.

## 2010-03-23 NOTE — Assessment & Plan Note (Signed)
Summary: LBP, Frequent urination 1.5 wks rm 3   Vital Signs:  Patient Profile:   45 Years Old Female CC:      LBP, Frequent, urrination x 1.5 wks Height:     62 inches Weight:      238 pounds O2 Sat:      100 % O2 treatment:    Room Air Temp:     96.6 degrees F oral Pulse rate:   100 / minute Pulse rhythm:   regular Resp:     16 per minute BP sitting:   133 / 94  (right arm) Cuff size:   regular  Vitals Entered By: Areta Haber CMA (March 24, 2009 7:07 PM)                  Current Allergies: No known allergies History of Present Illness Chief Complaint: LBP, Frequent, urrination x 1.5 wks History of Present Illness: AS ABOVE. PAIN COMES AND GOES. NO DYSURIA. NO INJURY. UNAFFECTED BY FOOD. NO FEVER. ADMITS TO SOME CONSTIPATION. STATES SHE HAS HAD A GB ULTRASOUND THAT WAS NEG. NO COUGH. NO REPETATIVE MOTION.   Current Problems: ABDOMINAL PAIN (ICD-789.00) ACUTE SINUSITIS, UNSPECIFIED (ICD-461.9) FAMILY HISTORY DIABETES 1ST DEGREE RELATIVE (ICD-V18.0)   Current Meds NEURONTIN 300 MG CAPS (GABAPENTIN) 2 tabs by mouth once daily TRAMADOL HCL 50 MG TABS (TRAMADOL HCL) 2 tabs by mouth once daily CELEBREX 200 MG CAPS (CELECOXIB) 1 tab by mouth once daily HYDROCODONE-ACETAMINOPHEN 5-500 MG TABS (HYDROCODONE-ACETAMINOPHEN) 2 tab by mouth once daily  REVIEW OF SYSTEMS Constitutional Symptoms      Denies fever, chills, night sweats, weight loss, weight gain, and fatigue.  Eyes       Denies change in vision, eye pain, eye discharge, glasses, contact lenses, and eye surgery. Ear/Nose/Throat/Mouth       Denies hearing loss/aids, change in hearing, ear pain, ear discharge, dizziness, frequent runny nose, frequent nose bleeds, sinus problems, sore throat, hoarseness, and tooth pain or bleeding.  Respiratory       Denies dry cough, productive cough, wheezing, shortness of breath, asthma, bronchitis, and emphysema/COPD.  Cardiovascular       Denies murmurs, chest pain, and  tires easily with exhertion.    Gastrointestinal       Denies stomach pain, nausea/vomiting, diarrhea, constipation, blood in bowel movements, and indigestion. Genitourniary       Complains of painful urination.      Denies kidney stones and loss of urinary control.      Comments: Frequent x 1.5 wk Neurological       Denies paralysis, seizures, and fainting/blackouts. Musculoskeletal       Denies muscle pain, joint pain, joint stiffness, decreased range of motion, redness, swelling, muscle weakness, and gout.      Comments: LBP Skin       Denies bruising, unusual mles/lumps or sores, and hair/skin or nail changes.  Psych       Denies mood changes, temper/anger issues, anxiety/stress, speech problems, depression, and sleep problems.  Past History:  Past Medical History: Last updated: 09/16/2008 Chronic Fatigue Fibromyalgia  Past Surgical History: Last updated: 09/16/2008 Caesarean section  Family History: Last updated: 09/16/2008 Family History Diabetes 1st degree relative  Social History: Last updated: 09/16/2008 Married Never Smoked Alcohol use-no Drug use-no Regular exercise-yes  Risk Factors: Exercise: yes (09/16/2008)  Risk Factors: Smoking Status: never (09/16/2008) Physical Exam General appearance: well developed, well nourished, no acute distress Chest/Lungs: no rales, wheezes, or rhonchi bilateral, breath sounds equal without effort  Heart: regular rate and  rhythm, no murmur Abdomen: SOFT . BS NORMAL. NO RIB PAIN. SKIN CLEAR, NO MASS OR GUARDING. Extremities: normal extremities Assessment New Problems: ABDOMINAL PAIN (ICD-789.00)   Plan New Orders: Est. Patient Level II [78295]    Patient Instructions: 1)  CONSIDER A LAXATIVE. CONT YOUR CHRONIC MEDICATION. MONITOR YOUR BP. FOLLOW UP WITH YOUR PCP.   Laboratory Results   Urine Tests  Date/Time Received: March 24, 2009 7:24 PM  Date/Time Reported: March 24, 2009 7:24 PM   Routine  Urinalysis   Color: lt. yellow Appearance: Clear Glucose: negative   (Normal Range: Negative) Bilirubin: negative   (Normal Range: Negative) Ketone: negative   (Normal Range: Negative) Spec. Gravity: <1.005   (Normal Range: 1.003-1.035) Blood: trace-intact   (Normal Range: Negative) pH: 6.0   (Normal Range: 5.0-8.0) Protein: negative   (Normal Range: Negative) Urobilinogen: 0.2   (Normal Range: 0-1) Nitrite: negative   (Normal Range: Negative) Leukocyte Esterace: negative   (Normal Range: Negative)

## 2010-03-29 NOTE — Letter (Signed)
Summary: MEDICAL RECORDS RELEASE  MEDICAL RECORDS RELEASE   Imported By: Haskell Riling 03/23/2010 12:42:17  _____________________________________________________________________  External Attachment:    Type:   Image     Comment:   External Document

## 2010-03-29 NOTE — Assessment & Plan Note (Signed)
Summary: LEGS/FEET HURT, SWOLLEN/TJ   Vital Signs:  Patient profile:   45 year old female Weight:      241 pounds BMI:     44.24 O2 Sat:      100 % on Room air Temp:     98.5 degrees F oral Pulse rate:   99 / minute Resp:     14 per minute BP sitting:   122 / 82  (right arm) Cuff size:   regular  Vitals Entered By: Lajean Saver RN (March 22, 2010 6:44 PM)  O2 Flow:  Room air Assessment  Assessed EDEMA LEG as comment only - Hoyt Koch MD New Problems: EDEMA LEG (ICD-782.3)   The patient and/or caregiver has been counseled thoroughly with regard to medications prescribed including dosage, schedule, interactions, rationale for use, and possible side effects and they verbalize understanding.  Diagnoses and expected course of recovery discussed and will return if not improved as expected or if the condition worsens. Patient and/or caregiver verbalized understanding.   CC: swelling in legs, feet and hands Is Patient Diabetic? No   Chief Complaint:  swelling in legs and feet and hands.  History of Present Illness: She complains of bilateral feet and ankle swelling.  The same thing occurred a few months ago but went away.  No CP, SOB.  No new meds, however she has run out of Neurontin last week.  She has fibromyalgia and is taking Flexeril, Mobic, and Vicodin.  No h/o heart problems, electrolyte problems.  One abdominal surgery (C-section).  Swelling is not painful, no bruises.  Elevating them seems to help. *Unable to get blood draw today, so will send to lab in the AM.   *Signed release of records form in clinic so that we can send lab results to [facility]  Allergies: 1)  ! * Mold, Ragweed  Past History:  Past Medical History: Reviewed history from 09/16/2008 and no changes required. Chronic Fatigue Fibromyalgia  Past Surgical History: Reviewed history from 11/07/2009 and no changes required. Caesarean section Tonsillectomy  Family History: Reviewed  history from 03/13/2010 and no changes required. Family History Diabetes 1st degree relative Father, High Chol  Social History: Reviewed history from 02/09/2010 and no changes required. Married Never Smoked Alcohol use-no Drug use-no Regular exercise-yes 2 children  Physical Exam  General:  Well-developed,well-nourished,in no acute distress; alert,appropriate and cooperative throughout examination Lungs:  Normal respiratory effort, chest expands symmetrically. Lungs are clear to auscultation, no crackles or wheezes. Heart:  Normal rate and regular rhythm. S1 and S2 normal without gallop, murmur, click, rub or other extra sounds. Extremities:  Mild non-pitting edema to shins, although body habitus makes it difficult to differentiate swelling vs adipose.  Non tender.  No ecchymoses.  FROM all joint.  L side may be slightly worse than R. Neurologic:  Distal NV status intact   Impression & Recommendations:  Problem # 1:  EDEMA LEG (ICD-782.3) Her feeling of leg swelling could be due to electrolyte disturbance (will check BMP), thyroid (check TSH), effect of fibromyalgia, stopping the neurontin, increased sodium intake, reduced physical activity, kidney problem (checking BMP), or abd/pelvic scar tissue causing venous blockage.  Will check blood and she will follow up with her PCP in 2 days.  Try compression stockings, elevating feet, low sodium diet.  If it becomes a recurrent problem, may want to consider PR.N Lasix to see if that helps.  Orders: T-TSH [phone] [phone]) T-Basic Metabolic Panel [id number]) Pulse Oximetry (single measurment) (29518)  Complete Medication  List: 1)  Flexeril 10 Mg Tabs (Cyclobenzaprine hcl) 2)  Hydrocodone-acetaminophen 5-500 Mg Tabs (Hydrocodone-acetaminophen) 3)  Mobic 15 Mg Tabs (Meloxicam)   Orders Added: 1)  Est. Patient Level IV [04540] 2)  T-TSH [[id number]] 3)  T-Basic Metabolic Panel [[id number]] 4)  Pulse Oximetry (single measurment)  [78469]

## 2010-06-08 ENCOUNTER — Encounter: Payer: Self-pay | Admitting: Family Medicine

## 2010-06-08 ENCOUNTER — Inpatient Hospital Stay (INDEPENDENT_AMBULATORY_CARE_PROVIDER_SITE_OTHER)
Admission: RE | Admit: 2010-06-08 | Discharge: 2010-06-08 | Disposition: A | Discharge status: Home / Self Care | Payer: Federal, State, Local not specified - PPO | Source: Ambulatory Visit | Attending: Family Medicine | Admitting: Family Medicine

## 2010-06-08 DIAGNOSIS — J069 Acute upper respiratory infection, unspecified: Secondary | ICD-10-CM

## 2010-06-08 DIAGNOSIS — J301 Allergic rhinitis due to pollen: Secondary | ICD-10-CM

## 2010-06-10 ENCOUNTER — Telehealth (INDEPENDENT_AMBULATORY_CARE_PROVIDER_SITE_OTHER): Payer: Self-pay

## 2010-07-04 NOTE — Assessment & Plan Note (Signed)
NAME:  Rhonda Oneal, Rhonda Oneal              ACCOUNT NO.:  [id number]   MEDICAL RECORD NO.:  [id number]          PATIENT TYPE:  POB   LOCATION:  [facility] [place]         FACILITY:  [facility]   PHYSICIAN:  Allie Bossier, MD        DATE OF BIRTH:  1965/08/11   DATE OF SERVICE:  11/08/2009                                  CLINIC NOTE   Ms. Rhonda Oneal is a 45 year old morbidly obese married black G2, P2 who  comes in here as followup from her visit September 21, 2009.  At that time,  she was complaining of significant amount of dysmenorrhea.  In the past,  she had been on birth control pills and had no dysmenorrhea, but since  her C-section 2008, she has just been using condoms for birth control.  Please note her husband had a vasectomy last month, but she is still  using condoms on a p.r.n. basis.  At last visit, she had told me that  she had a history of fibroids.  I ordered an ultrasound and there were  no fibroids seen.  There was a possibility of underlying septated  bicornuate uterus and but I am somewhat doubtful of this since the lady  has had a C-section in the past for a cord around the neck.  At that  time, there was no mention of uterine abnormality and her first  pregnancy was full-term and an uncomplicated vaginal delivery.  The  ovaries appeared normal.  Therefore, I have offered her birth control  pills versus the Mirena after she said that she did well with the birth  control pills in the past but she is not a smoker.  I will restart her  on birth control pills.  I have given a prescription for generic  Lo/Ovral, to come back in a couple weeks for blood pressure check.  She  will start the pills with her next period.      Allie Bossier, MD     MCD/MEDQ  D:  11/08/2009  T:  11/09/2009  Job:  [id number]

## 2010-07-04 NOTE — Assessment & Plan Note (Signed)
NAME:  Rhonda Rhonda              ACCOUNT NO.:  [id number]   MEDICAL RECORD NO.:  [id number]          PATIENT TYPE:  POB   LOCATION:  [facility] [place]         FACILITY:  [facility]   PHYSICIAN:  Allie Bossier, MD        DATE OF BIRTH:  04/06/1965   DATE OF SERVICE:  09/21/2009                                  CLINIC NOTE   Rhonda Rhonda is a 45 year old married white G2, P2.  She has 12-year-old  and 62 year old daughters.  She comes in here because she says that she  has been having a lot of pelvic pain with her periods.  This has been  going on since her C-section in 2008.  Please note that many of the  other years when she was not having pain with her periods, she was on  birth control pills, although not the entire time.   PAST MEDICAL HISTORY:  Fibromyalgia, obesity, and dysmenorrhea.  Please  note she has been told she has a uterine fibroid in the past.   REVIEW OF SYSTEMS:  Her periods are monthly lasting 5-6 days and not  particularly heavy.  She has been married for 15 years and denies  dyspareunia.  She uses condoms for birth control currently, but her  husband is planning to have a vasectomy next week.  Just prior to her  period, she gets vaginal irritation and itching, but she says Vagisil  wipes cure this issue.  Her family practice [facility] is [facility]  and her fibromyalgia care specialist is a pain specialist at  [facility] named Dr. Oneal Oneal.  Her last Pap smear was done in January  2010.  She always had normal Pap smears and her mammogram was done last  month and it was reportedly normal.   SOCIAL HISTORY:  Negative for tobacco, alcohol or drug use.   MEDICATIONS:  She takes Neurontin, Flexeril and Vicodin.   PAST SURGICAL HISTORY:  She had a C-section once, tonsillectomy and  adenoidectomy.  She had a wisdom teeth extracted.   FAMILY HISTORY:  Negative for colon cancer, but ovarian cancer was  present in her paternal aunt and breast cancer was present in a  maternal  aunt.   PHYSICAL EXAMINATION:  GENERAL:  Well-nourished, well-hydrated pleasant  black female.  VITAL SIGNS:  Height 5 feet 2 inches, weight 240 pounds, blood pressure  118/84, pulse 76.  HEENT:  Normal.  HEART:  Regular rate and rhythm.  LUNGS:  Clear to auscultation bilaterally.  BREASTS:  Pendulous breasts without masses, nipple discharge or skin  changes.  ABDOMEN:  Obese.  No palpable hepatosplenomegaly.  PELVIC:  External genitalia, no lesions.  Cervix appears normal with a  normal discharge.  No cervical motion tenderness.  Uterus is 8-week size  with a probable fundal fibroid.  Adnexa are nonenlarged and no masses.   ASSESSMENT AND PLAN:  1. Annual exam.  I have checked Pap smear.  Recommend self-breast and      self-vulvar exams monthly.  2. A 2-year history of dysmenorrhea.  I will order GYN ultrasound and      cervical cultures.  Due to her history of pain relief while on  OCPs, I am suspicious for a diagnosis of an endometriosis that may      or may not be confounded by her fibroids.  Depending on the results      of her test, I will likely recommend restarting birth control      pills.  She is somewhat hesitant about birth control pills and is      interested in a Mirena.  This is certainly an option as well.      Allie Bossier, MD     MCD/MEDQ  D:  09/21/2009  T:  09/21/2009  Job:  [id number]

## 2011-01-22 NOTE — Progress Notes (Signed)
Summary: EAR PAIN R & L EARS/TJ Room 4   Vital Signs:  Patient Profile:   45 Years Old Female CC:      Cough, ears hurt, headache,runny nose, congestion x 10 days Height:     62 inches Weight:      243 pounds O2 Sat:      100 % O2 treatment:    Room Air Temp:     98.3 degrees F oral Pulse rate:   100 / minute Pulse rhythm:   regular Resp:     16 per minute BP sitting:   118 / 81  (left arm) Cuff size:   regular  Vitals Entered By: Emilio Math (June 08, 2010 5:57 PM)                  Current Allergies: ! * MOLD, RAGWEEDHistory of Present Illness Chief Complaint: Cough, ears hurt, headache,runny nose, congestion x 10 days History of Present Illness:  Subjective: Patient complains of persistent sinus congestion for about 1.5 weeks.  Over the past 4 days has developed scratchy throat.   She has seasonal allergies and uses Flonase nasal spray  No pleuritic pain No wheezing + post-nasal drainage + sinus pain/pressure No itchy/red eyes + earache bilateral No hemoptysis No SOB No fever/chills No nausea No vomiting No abdominal pain No diarrhea No skin rashes + fatigue No myalgias No headache Used OTC meds without relief (Claritin, Allegra, Zyrtec D)  Current Meds FLEXERIL 10 MG TABS (CYCLOBENZAPRINE HCL)  HYDROCODONE-ACETAMINOPHEN 5-500 MG TABS (HYDROCODONE-ACETAMINOPHEN)  MOBIC 15 MG TABS (MELOXICAM)  ZYRTEC-D ALLERGY & CONGESTION 5-120 MG XR12H-TAB (CETIRIZINE-PSEUDOEPHEDRINE)  AMOXICILLIN 875 MG TABS (AMOXICILLIN) One by mouth Q12hr (Rx void after 06/16/10) BENZONATATE 200 MG CAPS (BENZONATATE) One by mouth hs as needed cough  REVIEW OF SYSTEMS Constitutional Symptoms      Denies fever, chills, night sweats, weight loss, weight gain, and fatigue.  Eyes       Denies change in vision, eye pain, eye discharge, glasses, contact lenses, and eye surgery. Ear/Nose/Throat/Mouth       Complains of frequent runny nose, sinus problems, and hoarseness.      Denies  hearing loss/aids, change in hearing, ear pain, ear discharge, dizziness, frequent nose bleeds, sore throat, and tooth pain or bleeding.  Respiratory       Complains of dry cough.      Denies productive cough, wheezing, shortness of breath, asthma, bronchitis, and emphysema/COPD.  Cardiovascular       Denies murmurs, chest pain, and tires easily with exhertion.    Gastrointestinal       Denies stomach pain, nausea/vomiting, diarrhea, constipation, blood in bowel movements, and indigestion. Genitourniary       Denies painful urination, kidney stones, and loss of urinary control. Neurological       Complains of headaches.      Denies paralysis, seizures, and fainting/blackouts. Musculoskeletal       Denies muscle pain, joint pain, joint stiffness, decreased range of motion, redness, swelling, muscle weakness, and gout.  Skin       Denies bruising, unusual mles/lumps or sores, and hair/skin or nail changes.  Psych       Denies mood changes, temper/anger issues, anxiety/stress, speech problems, depression, and sleep problems.  Past History:  Past Medical History: Reviewed history from 09/16/2008 and no changes required. Chronic Fatigue Fibromyalgia  Past Surgical History: Reviewed history from 11/07/2009 and no changes required. Caesarean section Tonsillectomy  Family History: Reviewed history from 03/13/2010 and no  changes required. Family History Diabetes 1st degree relative Father, High Chol  Social History: Reviewed history from 02/09/2010 and no changes required. Married Never Smoked Alcohol use-no Drug use-no Regular exercise-yes 2 children   Objective:  No acute distress  Eyes:  Pupils are equal, round, and reactive to light and accomdation.  Extraocular movement is intact.  Conjunctivae are not inflamed.  Ears:  Canals normal.  Tympanic membranes normal  Nose:  Mildly congested turbinates.  Bilateral maxillary sinus tenderness Pharynx:  Normal  Neck:  Supple.   No adenopathy is present.   Lungs:  Clear to auscultation.  Breath sounds are equal.  Heart:  Regular rate and rhythm without murmurs, rubs, or gallops.  Abdomen:  Nontender without masses or hepatosplenomegaly.  Bowel sounds are present.  No CVA or flank tenderness.  Extremities:  No edema.   Assessment New Problems: ALLERGIC RHINITIS, SEASONAL (ICD-477.0) UPPER RESPIRATORY INFECTION, ACUTE (ICD-465.9)  SUSPECT VIRAL URI  Plan New Medications/Changes: BENZONATATE 200 MG CAPS (BENZONATATE) One by mouth hs as needed cough  #12 x 0, 06/08/2010, Donna Christen MD AMOXICILLIN 875 MG TABS (AMOXICILLIN) One by mouth Q12hr (Rx void after 06/16/10)  #20 x 0, 06/08/2010, Donna Christen MD  New Orders: Pulse Oximetry (single measurment) [phone] Services provided After hours-Weekends-Holidays [99051] Est. Patient Level III [19147] Planning Comments:   Treat symptomatically for now:  Increase fluid intake, begin expectorant/decongestant, cough suppressant at bedtime.  Continue Flonase.   If fever/chills/sweats persist, or if not improving 5 days begin amoxicillin (given Rx to hold).  Followup with PCP if not improving 7 to 10 days days.   The patient and/or caregiver has been counseled thoroughly with regard to medications prescribed including dosage, schedule, interactions, rationale for use, and possible side effects and they verbalize understanding.  Diagnoses and expected course of recovery discussed and will return if not improved as expected or if the condition worsens. Patient and/or caregiver verbalized understanding.  Prescriptions: BENZONATATE 200 MG CAPS (BENZONATATE) One by mouth hs as needed cough  #12 x 0   Entered and Authorized by:   Donna Christen MD   Signed by:   Donna Christen MD on 06/08/2010   Method used:   Print then Give to Patient   RxID:   [id number] AMOXICILLIN 875 MG TABS (AMOXICILLIN) One by mouth Q12hr (Rx void after 06/16/10)  #20 x 0   Entered and Authorized by:    Donna Christen MD   Signed by:   Donna Christen MD on 06/08/2010   Method used:   Print then Give to Patient   RxID:   [id number]   Patient Instructions: 1)  Take Mucinex D (guaifenesin with decongestant) twice daily for congestion. 2)  Increase fluid intake, rest. 3)  May use Afrin nasal spray (or generic oxymetazoline) twice daily for about 5 days.  Also recommend using saline nasal spray several times daily and/or saline nasal irrigation.  Use the Flonase after Afrin and saline rinse 4)  Begin Amoxicillin if not improving about 5 days or if persistent fever develops. 5)  Followup with family doctor if not improving 7 to 10 days.   Orders Added: 1)  Pulse Oximetry (single measurment) [94760] 2)  Services provided After hours-Weekends-Holidays [99051] 3)  Est. Patient Level III [27253]  Appended Document: EAR PAIN R & L EARS/TJ Room 4 Pt here with daughter who is seen for URI. Pt states she never filled the Amox as prescribed on 4/19. Her symptoms persist. She requests rx for Augmentin, as "amox  has never helped in the past, but augmentin works well for me". -I agreed to write rx for Augmentin 875 mg by mouth -two times a day X 10 days.-Risks, benefits, alternatives discussed. Pt voiced understanding and agreement. Follow up here as needed. Lajean Manes, MD 06/10/10

## 2011-01-22 NOTE — Telephone Encounter (Signed)
  Phone Note Call from Patient   Caller: Patient Call For: medication change Summary of Call: pt called and stted she started to cough up blood and was going to start ABT.  However she states ABT ordered does not work for her.  While waiting for MD to finish with another pt, this patient came to clinic for child to be seen.  Dr. Sidney Ace is in room at present and states he will give her anotherprescription Initial call taken by: Linton Flemings RN,  June 10, 2010 12:57 PM

## 2011-01-23 ENCOUNTER — Ambulatory Visit: Payer: Federal, State, Local not specified - PPO | Admitting: Obstetrics & Gynecology

## 2011-02-14 ENCOUNTER — Ambulatory Visit (INDEPENDENT_AMBULATORY_CARE_PROVIDER_SITE_OTHER): Payer: Federal, State, Local not specified - PPO | Admitting: Obstetrics & Gynecology

## 2011-02-14 ENCOUNTER — Encounter: Payer: Self-pay | Admitting: Obstetrics & Gynecology

## 2011-02-14 VITALS — BP 128/86 | HR 78 | Temp 97.8°F | Ht 62.0 in | Wt 228.0 lb

## 2011-02-14 DIAGNOSIS — Z0000 Encounter for general adult medical examination without abnormal findings: Secondary | ICD-10-CM

## 2011-02-14 DIAGNOSIS — Z113 Encounter for screening for infections with a predominantly sexual mode of transmission: Secondary | ICD-10-CM

## 2011-02-14 DIAGNOSIS — Z1272 Encounter for screening for malignant neoplasm of vagina: Secondary | ICD-10-CM

## 2011-02-14 MED ORDER — NORGESTREL-ETHINYL ESTRADIOL 0.3-30 MG-MCG PO TABS: 1.0000 | ORAL_TABLET | Freq: Every day | ORAL | Status: DC

## 2011-02-14 NOTE — Progress Notes (Signed)
Subjective:    Rhonda Oneal is a 45 y.o. female who presents for an annual exam. The patient has no complaints today. The patient is sexually active. GYN screening history: last pap: was normal. The patient wears seatbelts: yes. The patient participates in regular exercise: yes. Has the patient ever been transfused or tattooed?: no. The patient reports that there is not domestic violence in her life.   Menstrual History: OB History    Grav Para Term Preterm Abortions TAB SAB Ect Mult Living                  Menarche age: 64 Patient's last menstrual period was 01/24/2011.    The following portions of the patient's history were reviewed and updated as appropriate: allergies, current medications, past family history, past medical history, past social history, past surgical history and problem list.  Review of Systems A comprehensive review of systems was negative. 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 She denies dysparunia. Her children are 47 and 65 yo.   Objective:    BP 128/86  Pulse 78  Temp(Src) 97.8 F (36.6 C) (Oral)  Ht 5\' 2"  (1.575 m)  Wt 228 lb (103.42 kg)  BMI 41.70 kg/m2  LMP 01/24/2011  General Appearance:    Alert, cooperative, no distress, appears stated age  Head:    Normocephalic, without obvious abnormality, atraumatic  Eyes:    PERRL, conjunctiva/corneas clear, EOM's intact, fundi    benign, both eyes  Ears:    Normal TM's and external ear canals, both ears  Nose:   Nares normal, septum midline, mucosa normal, no drainage    or sinus tenderness  Throat:   Lips, mucosa, and tongue normal; teeth and gums normal  Neck:   Supple, symmetrical, trachea midline, no adenopathy;    thyroid:  no enlargement/tenderness/nodules; no carotid   bruit or JVD  Back:     Symmetric, no curvature, ROM normal, no CVA tenderness  Lungs:     Clear to auscultation bilaterally, respirations unlabored  Chest Wall:    No tenderness or deformity   Heart:    Regular  rate and rhythm, S1 and S2 normal, no murmur, rub   or gallop  Breast Exam:    No tenderness, masses, or nipple abnormality  Abdomen:     Soft, non-tender, bowel sounds active all four quadrants,    no masses, no organomegaly, obese  Genitalia:    Normal female without lesion, discharge or tenderness, normal discharge, uterus 6 week size, NT and mobile     Extremities:   Extremities normal, atraumatic, no cyanosis or edema  Pulses:   2+ and symmetric all extremities  Skin:   Skin color, texture, turgor normal, no rashes or lesions  Lymph nodes:   Cervical, supraclavicular, and axillary nodes normal  Neurologic:   CNII-XII intact, normal strength, sensation and reflexes    throughout  .    Assessment:    Healthy female exam. I have recommended continued weight loss.   Plan:     Mammogram. Pap smear.  I will refill her OCPs for relief of dysmenorrhea.

## 2011-03-06 ENCOUNTER — Inpatient Hospital Stay: Admission: RE | Admit: 2011-03-06 | Payer: Federal, State, Local not specified - PPO | Source: Ambulatory Visit

## 2011-03-27 ENCOUNTER — Emergency Department (INDEPENDENT_AMBULATORY_CARE_PROVIDER_SITE_OTHER)
Admission: EM | Admit: 2011-03-27 | Discharge: 2011-03-27 | Disposition: A | Discharge status: Home / Self Care | Payer: Federal, State, Local not specified - PPO | Source: Home / Self Care | Attending: Emergency Medicine | Admitting: Emergency Medicine

## 2011-03-27 ENCOUNTER — Encounter: Payer: Self-pay | Admitting: *Deleted

## 2011-03-27 DIAGNOSIS — J329 Chronic sinusitis, unspecified: Secondary | ICD-10-CM

## 2011-03-27 DIAGNOSIS — J069 Acute upper respiratory infection, unspecified: Secondary | ICD-10-CM

## 2011-03-27 MED ORDER — AMOXICILLIN-POT CLAVULANATE 875-125 MG PO TABS: 1.0000 | ORAL_TABLET | Freq: Two times a day (BID) | ORAL | Status: AC

## 2011-03-27 NOTE — ED Provider Notes (Signed)
History     CSN: [id number]  Arrival date & time 03/27/11  0810   First MD Initiated Contact with Patient 03/27/11 [phone]      Chief Complaint  Patient presents with  . Sore Throat  . Headache    (Consider location/radiation/quality/duration/timing/severity/associated sxs/prior treatment) HPI Mikaelah is a 46 y.o. female who complains of onset of cold symptoms for 4 days. Daughter also sick. + sore throat + cough No pleuritic pain No wheezing + nasal congestion +post-nasal drainage + sinus pain/pressure No chest congestion No itchy/red eyes No earache No hemoptysis No SOB No chills/sweats No fever No nausea No vomiting No abdominal pain No diarrhea No skin rashes No fatigue No myalgias + headache    Past Medical History  Diagnosis Date  . Morbid obesity   . Fibromyalgia   . IBS (irritable bowel syndrome)   . Arthritis     Past Surgical History  Procedure Date  . Cesarean section   . Tonsillectomy and adenoidectomy   . Wisdom tooth extraction     Family History  Problem Relation Age of Onset  . Diabetes Mother   . Breast cancer Maternal Aunt   . Ovarian cancer Paternal Aunt     History  Substance Use Topics  . Smoking status: Never Smoker   . Smokeless tobacco: Never Used  . Alcohol Use: No    OB History    Grav Para Term Preterm Abortions TAB SAB Ect Mult Living   2 2              Review of Systems  Allergies  Review of patient's allergies indicates no known allergies.  Home Medications   Current Outpatient Rx  Name Route Sig Dispense Refill  . AMOXICILLIN-POT CLAVULANATE 875-125 MG PO TABS Oral Take 1 tablet by mouth 2 (two) times daily. 14 tablet 0  . CELECOXIB 200 MG PO CAPS Oral Take 200 mg by mouth 2 (two) times daily.      Marland Kitchen CINNAMON PO Oral Take 1,000 mg by mouth 2 (two) times daily.      Marland Kitchen GABAPENTIN 600 MG PO TABS Oral Take 600 mg by mouth 3 (three) times daily.      Marland Kitchen GINGER ROOT PO Oral Take 1 tablet by mouth daily.      Marland Kitchen  HYDROCODONE-ACETAMINOPHEN 5-500 MG PO TABS Oral Take 1 tablet by mouth every 8 (eight) hours as needed.      . LUBIPROSTONE 8 MCG PO CAPS Oral Take 8 mcg by mouth daily with breakfast.      . ONE-DAILY MULTI VITAMINS PO TABS Oral Take 1 tablet by mouth daily.      Marland Kitchen NORGESTREL-ETHINYL ESTRADIOL 0.3-30 MG-MCG PO TABS Oral Take 1 tablet by mouth daily. 1 Package 11  . TIZANIDINE HCL 4 MG PO CAPS Oral Take 4 mg by mouth 3 (three) times daily as needed.      . TURMERIC PO Oral Take 2 tablets by mouth daily.        BP 148/66  Pulse 89  Temp(Src) 98.5 F (36.9 C) (Oral)  Resp 16  Ht 5\' 2"  (1.575 m)  Wt 225 lb 8 oz (102.286 kg)  BMI 41.24 kg/m2  SpO2 100%  LMP 03/22/2011  Physical Exam  Nursing note and vitals reviewed. Constitutional: She is oriented to person, place, and time. She appears well-developed and well-nourished.  HENT:  Head: Normocephalic and atraumatic.  Right Ear: Tympanic membrane, external ear and ear canal normal.  Left Ear: Tympanic membrane,  external ear and ear canal normal.  Nose: Mucosal edema and rhinorrhea present.  Mouth/Throat: Posterior oropharyngeal erythema present. No oropharyngeal exudate or posterior oropharyngeal edema.  Eyes: No scleral icterus.  Neck: Neck supple.  Cardiovascular: Regular rhythm and normal heart sounds.   Pulmonary/Chest: Effort normal and breath sounds normal. No respiratory distress.  Neurological: She is alert and oriented to person, place, and time.  Skin: Skin is warm and dry.  Psychiatric: She has a normal mood and affect. Her speech is normal.    ED Course  Procedures (including critical care time)   Labs Reviewed  POCT RAPID STREP A (OFFICE)   No results found.   1. Sinusitis   2. Acute upper respiratory infections of unspecified site       MDM  1)  Take the prescribed antibiotic as instructed. 2)  Use nasal saline solution (over the counter) at least 3 times a day. 3)  Use over the counter decongestants  like Zyrtec-D every 12 hours as needed to help with congestion.  If you have hypertension, do not take medicines with sudafed.  4)  Can take tylenol every 6 hours or motrin every 8 hours for pain or fever. 5)  Follow up with your primary doctor if no improvement in 5-7 days, sooner if increasing pain, fever, or new symptoms.     Lily Kocher, MD 03/27/11 [phone]

## 2011-03-27 NOTE — ED Notes (Signed)
Pt c/o sore throat, HA and facial pain x 2 days.

## 2011-04-23 ENCOUNTER — Encounter: Payer: Self-pay | Admitting: *Deleted

## 2011-04-23 ENCOUNTER — Emergency Department (INDEPENDENT_AMBULATORY_CARE_PROVIDER_SITE_OTHER)
Admission: EM | Admit: 2011-04-23 | Discharge: 2011-04-23 | Disposition: A | Discharge status: Home Health | Payer: Federal, State, Local not specified - PPO | Source: Home / Self Care | Attending: Emergency Medicine | Admitting: Emergency Medicine

## 2011-04-23 DIAGNOSIS — J069 Acute upper respiratory infection, unspecified: Secondary | ICD-10-CM

## 2011-04-23 DIAGNOSIS — R059 Cough, unspecified: Secondary | ICD-10-CM

## 2011-04-23 DIAGNOSIS — R05 Cough: Secondary | ICD-10-CM

## 2011-04-23 LAB — POCT RAPID STREP A (OFFICE): Rapid Strep A Screen: NEGATIVE

## 2011-04-23 MED ORDER — PREDNISONE (PAK) 10 MG PO TABS: 10.0000 mg | ORAL_TABLET | Freq: Every day | ORAL | Status: AC

## 2011-04-23 MED ORDER — GUAIFENESIN-CODEINE 100-10 MG/5ML PO SYRP: 5.0000 mL | ORAL_SOLUTION | Freq: Four times a day (QID) | ORAL | Status: AC | PRN

## 2011-04-23 NOTE — ED Provider Notes (Signed)
History     CSN: [id number]  Arrival date & time 04/23/11  1517   First MD Initiated Contact with Patient 04/23/11 1525      No chief complaint on file.   (Consider location/radiation/quality/duration/timing/severity/associated sxs/prior treatment) HPI Rhonda Oneal is a 46 y.o. female who complains of onset of cold symptoms for 7 days.  Pt here 1 month ago for similar symptoms, placed on Augmentin. Got better, then sick again, went to PCP who placed on Levaquin 1 day (which aggravated her fibromyalgia) and switched to [facility] which didn't help at all.  Also on Flonase x1 week and has appt with ENT in 2 weeks.   + sore throat + cough No pleuritic pain No wheezing + nasal congestion +post-nasal drainage No sinus pain/pressure No chest congestion No itchy/red eyes + earache No hemoptysis No SOB No chills/sweats No fever No nausea No vomiting No abdominal pain No diarrhea No skin rashes No fatigue No myalgias + headache    Past Medical History  Diagnosis Date  . Morbid obesity   . Fibromyalgia   . IBS (irritable bowel syndrome)   . Arthritis     Past Surgical History  Procedure Date  . Cesarean section   . Tonsillectomy and adenoidectomy   . Wisdom tooth extraction     Family History  Problem Relation Age of Onset  . Diabetes Mother   . Breast cancer Maternal Aunt   . Ovarian cancer Paternal Aunt     History  Substance Use Topics  . Smoking status: Never Smoker   . Smokeless tobacco: Never Used  . Alcohol Use: No    OB History    Grav Para Term Preterm Abortions TAB SAB Ect Mult Living   2 2              Review of Systems  All other systems reviewed and are negative.    Allergies  Review of patient's allergies indicates no known allergies.  Home Medications   Current Outpatient Rx  Name Route Sig Dispense Refill  . CELECOXIB 200 MG PO CAPS Oral Take 200 mg by mouth 2 (two) times daily.      Marland Kitchen CINNAMON PO Oral Take 1,000 mg by mouth 2 (two)  times daily.      Marland Kitchen GABAPENTIN 600 MG PO TABS Oral Take 600 mg by mouth 3 (three) times daily.      Marland Kitchen GINGER ROOT PO Oral Take 1 tablet by mouth daily.      Marland Kitchen HYDROCODONE-ACETAMINOPHEN 5-500 MG PO TABS Oral Take 1 tablet by mouth every 8 (eight) hours as needed.      . LUBIPROSTONE 8 MCG PO CAPS Oral Take 8 mcg by mouth daily with breakfast.      . ONE-DAILY MULTI VITAMINS PO TABS Oral Take 1 tablet by mouth daily.      Marland Kitchen NORGESTREL-ETHINYL ESTRADIOL 0.3-30 MG-MCG PO TABS Oral Take 1 tablet by mouth daily. 1 Package 11  . TIZANIDINE HCL 4 MG PO CAPS Oral Take 4 mg by mouth 3 (three) times daily as needed.      . TURMERIC PO Oral Take 2 tablets by mouth daily.        LMP 03/22/2011  Physical Exam  Nursing note and vitals reviewed. Constitutional: She is oriented to person, place, and time. She appears well-developed and well-nourished.  HENT:  Head: Normocephalic and atraumatic.  Right Ear: Tympanic membrane, external ear and ear canal normal.  Left Ear: Tympanic membrane, external ear and ear canal  normal.  Nose: Rhinorrhea present.  Mouth/Throat: Posterior oropharyngeal erythema (with post nasal drip) present. No oropharyngeal exudate or posterior oropharyngeal edema.  Eyes: No scleral icterus.  Neck: Neck supple.  Cardiovascular: Regular rhythm and normal heart sounds.   Pulmonary/Chest: Effort normal and breath sounds normal. No respiratory distress.  Neurological: She is alert and oriented to person, place, and time.  Skin: Skin is warm and dry.  Psychiatric: She has a normal mood and affect. Her speech is normal.    ED Course  Procedures (including critical care time)  Labs Reviewed - No data to display No results found.   1. Cough   2. Acute upper respiratory infections of unspecified site       MDM  1)  No antibiotic given since likely not bacterial.  Didn't improve on Omnicef.  Instead will do Prednisone, continue Flonase, and make sure she follows up with ENT.   Perhaps allergy testing?  Rx for Cheratussin, but should cut back on her morphine while taking Rx cough meds.   2)  Use nasal saline solution (over the counter) at least 3 times a day. 3)  Use over the counter decongestants like Zyrtec-D every 12 hours as needed to help with congestion.  If you have hypertension, do not take medicines with sudafed.  4)  Can take tylenol every 6 hours or motrin every 8 hours for pain or fever. 5)  Follow up with your primary doctor if no improvement in 5-7 days, sooner if increasing pain, fever, or new symptoms.      Lily Kocher, MD 04/23/11 [phone]

## 2011-04-23 NOTE — ED Notes (Signed)
Patient c/o productive cough and bilateral ear pain x 4 days. She has taken mucinex max without relief.

## 2011-06-20 ENCOUNTER — Ambulatory Visit
Admission: RE | Admit: 2011-06-20 | Discharge: 2011-06-20 | Disposition: A | Discharge status: Home / Self Care | Payer: Federal, State, Local not specified - PPO | Source: Ambulatory Visit | Attending: Sports Medicine | Admitting: Sports Medicine

## 2011-06-20 ENCOUNTER — Other Ambulatory Visit: Payer: Self-pay | Admitting: Sports Medicine

## 2011-06-20 DIAGNOSIS — M25559 Pain in unspecified hip: Secondary | ICD-10-CM

## 2011-10-01 ENCOUNTER — Emergency Department (INDEPENDENT_AMBULATORY_CARE_PROVIDER_SITE_OTHER): Payer: Federal, State, Local not specified - PPO

## 2011-10-01 ENCOUNTER — Emergency Department (INDEPENDENT_AMBULATORY_CARE_PROVIDER_SITE_OTHER)
Admission: EM | Admit: 2011-10-01 | Discharge: 2011-10-01 | Disposition: A | Discharge status: Home / Self Care | Payer: Federal, State, Local not specified - PPO | Source: Home / Self Care

## 2011-10-01 DIAGNOSIS — R079 Chest pain, unspecified: Secondary | ICD-10-CM

## 2011-10-01 DIAGNOSIS — R0781 Pleurodynia: Secondary | ICD-10-CM

## 2011-10-01 MED ORDER — POLYETHYLENE GLYCOL 3350 17 GM/SCOOP PO POWD: 17.0000 g | Freq: Every day | ORAL | Status: AC
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
## 2011-10-01 NOTE — ED Notes (Signed)
States she has had right side rib pain for years and lower back pain for months. Denies urinary difficulty.

## 2011-10-02 NOTE — ED Provider Notes (Signed)
Agree with exam, assessment, and plan.   Lattie Haw, MD 10/02/11 [phone]

## 2011-11-29 ENCOUNTER — Encounter: Payer: Self-pay | Admitting: Family Medicine

## 2011-11-29 ENCOUNTER — Ambulatory Visit (INDEPENDENT_AMBULATORY_CARE_PROVIDER_SITE_OTHER): Payer: Federal, State, Local not specified - PPO | Admitting: Family Medicine

## 2011-11-29 VITALS — BP 145/94 | HR 83 | Ht 62.0 in | Wt 234.0 lb

## 2011-11-29 DIAGNOSIS — M5430 Sciatica, unspecified side: Secondary | ICD-10-CM

## 2011-11-29 DIAGNOSIS — K589 Irritable bowel syndrome without diarrhea: Secondary | ICD-10-CM

## 2011-11-29 MED ORDER — METAXALONE 800 MG PO TABS: ORAL_TABLET | ORAL | Status: DC

## 2011-11-29 MED ORDER — DICLOFENAC SODIUM 50 MG PO TBEC: DELAYED_RELEASE_TABLET | ORAL | Status: DC

## 2011-11-29 NOTE — Progress Notes (Signed)
CC: Rhonda Oneal is a 46 y.o. female is here for Establish Care and left leg pain   Subjective: HPI:  Patient presents to establish care.   She complains of right buttock and leg pain is been present for 3-4 weeks this felt on a daily basis, all hours of the day, worse first thing in the morning before going to bed. It's worse when sitting for long periods of time. It improves when standing up. She's never had this pain before. She is a long-standing history of hip pain and diffuse body aches that she attributes to fibromyalgia. She states the pain she is experiencing is far from that was felt from fibromyalgia. She denies any recent trauma, overexertion, change in footwear, change in daily routines, nor new work requirements. There been no interventions as of yet. She mentions she does have an MRI of her lumbar region and was told that she has a bulging disc but does not know in what region. The pain is described as a "discomfort "it starts in the right buttock and radiates down the back of the leg stopping at the heel. It is continuous. She denies motor or sensory disturbances, has not had any change in gait, has not had any falls, and denies any balance issues. There's been no bowel or bladder incontinence, nor saddle anesthesia. She denies any joint swelling anterior groin pain or change in skin appearance in the affected leg.   Review Of Systems Outlined In HPI  Past Medical History  Diagnosis Date  . Morbid obesity   . Fibromyalgia   . IBS (irritable bowel syndrome)   . Arthritis   . DERMATITIS, ATOPIC 02/09/2010    Qualifier: Diagnosis of  By: Orson Aloe MD, Tinnie Gens    . EDEMA LEG 03/22/2010    Qualifier: Diagnosis of  By: Orson Aloe MD, Tinnie Gens       Family History  Problem Relation Age of Onset  . Diabetes Mother   . Breast cancer Maternal Aunt   . Ovarian cancer Paternal Aunt      History  Substance Use Topics  . Smoking status: Never Smoker   . Smokeless tobacco: Never  Used  . Alcohol Use: No     Objective: Filed Vitals:   11/29/11 1627  BP: 145/94  Pulse: 83    General: Alert and Oriented, No Acute Distress Lungs: Comfortable work of breathing. Good air movement. Cardiac: Regular rate and rhythm.  MSK: Right leg: Positive straight leg raise on the right but not left. Pain present over greater trochanter with deep palpation however this pain is separate than her presenting pain. No pain with log roll of the femur. No pain withfaber nor fadir. No pain with femoral grind. Negative stork test. No pain with midline palpation of the thoracic lumbar. No SI joint pain with deep palpation. L4 and S1 DTRs two over four bilaterally. Full range of motion and strength in the hip, knee, ankle, toes Extremities: No peripheral edema.  Strong peripheral pulses.  Mental Status: No depression, anxiety, nor agitation. Skin: Warm and dry.  Assessment & Plan: Rhonda Oneal was seen today for establish care and left leg pain.  Diagnoses and associated orders for this visit:  Irritable bowel syndrome  Sciatica - metaxalone (SKELAXIN) 800 MG tablet; Take one tab every 8 hours only as needed for muscle pain and/or spasm.  Other Orders - diclofenac (VOLTAREN) 50 MG EC tablet; Take one tablet every 8 hours only as needed for pain, take with small snack.  Suspect she is experiencing sciatica she was given an home exercise plan for stretching and rehabilitation along with Skelaxin and diclofenac. If no improvement in 2 weeks rest return to see Dr. Karie Schwalbe. in to bring her MRI for further evaluation.  Return in about 2 weeks (around 12/13/2011).

## 2011-11-30 NOTE — Progress Notes (Signed)
Patient reports mammogram 2013 February without abnormalities. Pap smear February 2013 without abnormalities

## 2011-12-17 ENCOUNTER — Telehealth: Payer: Self-pay | Admitting: *Deleted

## 2011-12-17 ENCOUNTER — Encounter: Payer: Self-pay | Admitting: Family Medicine

## 2011-12-17 ENCOUNTER — Ambulatory Visit (INDEPENDENT_AMBULATORY_CARE_PROVIDER_SITE_OTHER): Payer: Federal, State, Local not specified - PPO | Admitting: Family Medicine

## 2011-12-17 VITALS — BP 130/78 | Temp 98.2°F | Wt 233.0 lb

## 2011-12-17 DIAGNOSIS — M5430 Sciatica, unspecified side: Secondary | ICD-10-CM

## 2011-12-17 DIAGNOSIS — Z0289 Encounter for other administrative examinations: Secondary | ICD-10-CM

## 2011-12-17 DIAGNOSIS — J329 Chronic sinusitis, unspecified: Secondary | ICD-10-CM

## 2011-12-17 MED ORDER — METAXALONE 800 MG PO TABS: ORAL_TABLET | ORAL | Status: DC

## 2011-12-17 MED ORDER — AMOXICILLIN-POT CLAVULANATE 500-125 MG PO TABS: ORAL_TABLET | ORAL | Status: AC

## 2011-12-17 NOTE — Telephone Encounter (Signed)
Pt calls and states that the samples of skelaxin you gave her worked well and would like to have a prescription sent to [facility] in [place] for those

## 2011-12-17 NOTE — Telephone Encounter (Signed)
[facility] 

## 2011-12-17 NOTE — Progress Notes (Signed)
CC: Rhonda Oneal is a 46 y.o. female is here for Facial Pain   Subjective: HPI:  Of severe facial pain that is localized in the for head and below the eyes, mild involvement of her upper molars bilaterally. Associated with stuffiness of the nose but no nasal discharge. Pain is somewhat worse when leaning forward. She's been taking pseudoephedrine without much improvement. Pain is present 24 hours a day, nothing particularly makes it better or worse. She denies fevers, chills, nausea, motor sensory disturbances, hearing loss, cough, shortness of breath.    Review Of Systems Outlined In HPI  Past Medical History  Diagnosis Date  . Morbid obesity   . Fibromyalgia   . IBS (irritable bowel syndrome)   . Arthritis   . DERMATITIS, ATOPIC 02/09/2010    Qualifier: Diagnosis of  By: Orson Aloe MD, Tinnie Gens    . EDEMA LEG 03/22/2010    Qualifier: Diagnosis of  By: Orson Aloe MD, Tinnie Gens       Family History  Problem Relation Age of Onset  . Diabetes Mother   . Breast cancer Maternal Aunt   . Ovarian cancer Paternal Aunt      History  Substance Use Topics  . Smoking status: Never Smoker   . Smokeless tobacco: Never Used  . Alcohol Use: No     Objective: Filed Vitals:   12/17/11 1638  BP: 130/78  Temp: 98.2 F (36.8 C)    General: Alert and Oriented, No Acute Distress HEENT: Pupils equal, round, reactive to light. Conjunctivae clear.  External ears unremarkable, canals clear with intact TMs with appropriate landmarks.  Middle ear appears open without effusion. Pink inferior turbinates.  Moist mucous membranes, pharynx without inflammation nor lesions.  Neck supple without palpable lymphadenopathy nor abnormal masses. Frontal and maxillary sinus and is with percussion Lungs: Clear to auscultation bilaterally, no wheezing/ronchi/rales.  Comfortable work of breathing. Good air movement. Cardiac: Regular rate and rhythm. Normal S1/S2.  No murmurs, rubs, nor gallops.   Neuro: CN II-XII  grossly intact, gait normal No skin changes over the site of discomfort.   Assessment & Plan: [name] was seen today for facial pain.  Diagnoses and associated orders for this visit:  Sinusitis - amoxicillin-clavulanate (AUGMENTIN) 500-125 MG per tablet; Take one by mouth every 8 hours for ten total days.    Discuss symptomatically her including Mucinex D, nonsteroidal anti-inflammatories and nasal saline washes. I provided her a prescription that should be used if no improvement after 7-10 days symptomatic care, fevers develop, there is a rebound of pain after a period of Improvement.  Return if symptoms worsen or fail to improve.

## 2011-12-25 ENCOUNTER — Telehealth: Payer: Self-pay | Admitting: *Deleted

## 2011-12-25 DIAGNOSIS — J329 Chronic sinusitis, unspecified: Secondary | ICD-10-CM

## 2011-12-25 MED ORDER — DOXYCYCLINE HYCLATE 100 MG PO TABS: ORAL_TABLET | ORAL | Status: AC

## 2011-12-25 MED ORDER — MOMETASONE FUROATE 50 MCG/ACT NA SUSP: 2.0000 | Freq: Every day | NASAL | Status: DC

## 2011-12-25 NOTE — Telephone Encounter (Signed)
Pt states she seen you last week for sinusitis. States she took the abx you gave her but she doesn't feel any better. In your office note it states for her to return if not better. She is asking if you will call in another abx. Please advise.

## 2011-12-25 NOTE — Telephone Encounter (Signed)
LMOM that rx's have been sent to pharmacy

## 2011-12-25 NOTE — Telephone Encounter (Signed)
Misty wil lyou please let her know I sent in doxycycline and a nasal steroid, both to be used together.

## 2012-02-20 ENCOUNTER — Emergency Department (INDEPENDENT_AMBULATORY_CARE_PROVIDER_SITE_OTHER)
Admission: EM | Admit: 2012-02-20 | Discharge: 2012-02-20 | Disposition: A | Discharge status: Home / Self Care | Payer: Federal, State, Local not specified - PPO | Source: Home / Self Care | Attending: Family Medicine | Admitting: Family Medicine

## 2012-02-20 ENCOUNTER — Encounter: Payer: Self-pay | Admitting: Emergency Medicine

## 2012-02-20 DIAGNOSIS — R0781 Pleurodynia: Secondary | ICD-10-CM

## 2012-02-20 DIAGNOSIS — J0100 Acute maxillary sinusitis, unspecified: Secondary | ICD-10-CM

## 2012-02-20 DIAGNOSIS — R079 Chest pain, unspecified: Secondary | ICD-10-CM

## 2012-02-20 MED ORDER — AMOXICILLIN-POT CLAVULANATE 875-125 MG PO TABS: 1.0000 | ORAL_TABLET | Freq: Two times a day (BID) | ORAL | Status: DC

## 2012-02-20 MED ORDER — MELOXICAM 7.5 MG PO TABS: ORAL_TABLET | ORAL | Status: DC

## 2012-02-20 MED ORDER — BENZONATATE 200 MG PO CAPS: 200.0000 mg | ORAL_CAPSULE | Freq: Every day | ORAL | Status: DC

## 2012-02-20 NOTE — ED Provider Notes (Signed)
History     CSN: [id number]  Arrival date & time 02/20/12  1501   First MD Initiated Contact with Patient 02/20/12 1554      Chief Complaint  Patient presents with  . Facial Pain  . Nasal Congestion  . Cough  . Otalgia      HPI Comments: Patient complains of approximately 2 week history of vague pain in her right lateral ribs without preceding trauma.  About one week ago she developed gradually progressive URI symptoms beginning with a mild sore throat (now improved), followed by nasal congestion and cough. Complains of fatigue but no myalgias.  Cough is now worse at night and generally non-productive during the day.  There has been no  shortness of breath or wheezes, although she continues to have mild soreness in right lateral ribs.  She now complains of bilateral earache.  No fever.   The history is provided by the patient.    Past Medical History  Diagnosis Date  . Morbid obesity   . Fibromyalgia   . IBS (irritable bowel syndrome)   . Arthritis   . DERMATITIS, ATOPIC 02/09/2010    Qualifier: Diagnosis of  By: Orson Aloe MD, Tinnie Gens    . EDEMA LEG 03/22/2010    Qualifier: Diagnosis of  By: Orson Aloe MD, Tinnie Gens      Past Surgical History  Procedure Date  . Cesarean section   . Tonsillectomy and adenoidectomy   . Wisdom tooth extraction     Family History  Problem Relation Age of Onset  . Diabetes Mother   . Breast cancer Maternal Aunt   . Ovarian cancer Paternal Aunt   . Hyperlipidemia Father     History  Substance Use Topics  . Smoking status: Never Smoker   . Smokeless tobacco: Never Used  . Alcohol Use: No    OB History    Grav Para Term Preterm Abortions TAB SAB Ect Mult Living   2 2              Review of Systems + mild sore throat + cough No pleuritic pain No wheezing + nasal congestion + post-nasal drainage No sinus pain/pressure No itchy/red eyes ? earache No hemoptysis No SOB No fever, + sweats No nausea No vomiting No abdominal  pain No diarrhea No urinary symptoms No skin rashes + fatigue No myalgias No headache Used OTC meds without relief  Allergies  Review of patient's allergies indicates no known allergies.  Home Medications   Current Outpatient Rx  Name  Route  Sig  Dispense  Refill  . AMOXICILLIN-POT CLAVULANATE 875-125 MG PO TABS   Oral   Take 1 tablet by mouth 2 (two) times daily. Take with food   20 tablet   0   . BENZONATATE 200 MG PO CAPS   Oral   Take 1 capsule (200 mg total) by mouth at bedtime. Take as needed for cough   12 capsule   0   . CINNAMON PO   Oral   Take 1,000 mg by mouth 2 (two) times daily.           Marland Kitchen DICLOFENAC SODIUM 50 MG PO TBEC      Take one tablet every 8 hours only as needed for pain, take with small snack.   45 tablet   1   . GABAPENTIN 600 MG PO TABS   Oral   Take 600 mg by mouth 3 (three) times daily.           Marland Kitchen  GINGER ROOT PO   Oral   Take 1 tablet by mouth daily.           . LUBIPROSTONE 8 MCG PO CAPS   Oral   Take 8 mcg by mouth daily with breakfast.           . MELOXICAM 7.5 MG PO TABS      Take one tab by mouth twice daily.  Take with food   30 tablet   1   . METAXALONE 800 MG PO TABS      Take one tab every 8 hours only as needed for muscle pain and/or spasm.   45 tablet   1   . MOMETASONE FUROATE 50 MCG/ACT NA SUSP   Nasal   Place 2 sprays into the nose daily. Two sprays each nostril.   17 g   2   . ONE-DAILY MULTI VITAMINS PO TABS   Oral   Take 1 tablet by mouth daily.           Marland Kitchen TIZANIDINE HCL 4 MG PO CAPS   Oral   Take 4 mg by mouth 3 (three) times daily as needed.           . TURMERIC PO   Oral   Take 2 tablets by mouth daily.             BP 138/81  Pulse 75  Temp 97.7 F (36.5 C) (Oral)  Resp 16  Ht 5\' 3"  (1.6 m)  Wt 232 lb (105.235 kg)  BMI 41.10 kg/m2  SpO2 100%  Physical Exam Nursing notes and Vital Signs reviewed. Appearance:  Patient appears stated age, and in no acute distress.   Patient is morbidly obese (BMI 41.1) Eyes:  Pupils are equal, round, and reactive to light and accomodation.  Extraocular movement is intact.  Conjunctivae are not inflamed  Ears:  Canals normal.  Tympanic membranes normal.  Nose:  Mildly congested turbinates.  Maxillary sinus tenderness is present.  Pharynx:  Normal Neck:  Supple.  Slightly tender shotty posterior nodes are palpated bilaterally  Lungs:  Clear to auscultation.  Breath sounds are equal.  Chest:  Vague tenderness over intercostal muscles right lateral ribs. Heart:  Regular rate and rhythm without murmurs, rubs, or gallops.  Abdomen:  Nontender without masses or hepatosplenomegaly.  Bowel sounds are present.  No CVA or flank tenderness.  Extremities:  No edema.  No calf tenderness Skin:  No rash present.   ED Course  Procedures none      1. Rib pain on right side; suspect intercostal muscle strain   2. Acute maxillary sinusitis       MDM  Begin Augmentin.  Prescription written for Benzonatate [facility]) to take at bedtime for night-time cough.  Rib belt applied Trial of Mobic for rib pain. Take Mucinex D (guaifenesin with decongestant) twice daily for congestion.  Increase fluid intake, rest. May use Afrin nasal spray (or generic oxymetazoline) twice daily for about 5 days.  Also recommend using saline nasal spray several times daily and saline nasal irrigation (AYR is a common brand) Stop all antihistamines for now, and other non-prescription cough/cold preparations. Follow-up with family doctor if not improving 7 to 10 days.        Lattie Haw, MD 02/24/12 1116

## 2012-02-20 NOTE — ED Notes (Signed)
Reports facial/sinus pain, ear pain bilaterally, congestion and cough for about 6 days. Did have Flu vaccination this season. No OTCs past 6 hours.

## 2012-02-25 ENCOUNTER — Ambulatory Visit (INDEPENDENT_AMBULATORY_CARE_PROVIDER_SITE_OTHER): Payer: Federal, State, Local not specified - PPO | Admitting: Family Medicine

## 2012-02-25 ENCOUNTER — Encounter: Payer: Self-pay | Admitting: Family Medicine

## 2012-02-25 ENCOUNTER — Telehealth: Payer: Self-pay | Admitting: Emergency Medicine

## 2012-02-25 VITALS — BP 121/70 | HR 89 | Temp 98.0°F | Wt 227.0 lb

## 2012-02-25 DIAGNOSIS — IMO0002 Reserved for concepts with insufficient information to code with codable children: Secondary | ICD-10-CM

## 2012-02-25 DIAGNOSIS — S2341XA Sprain of ribs, initial encounter: Secondary | ICD-10-CM

## 2012-02-25 MED ORDER — CYCLOBENZAPRINE HCL 10 MG PO TABS: ORAL_TABLET | ORAL | Status: DC
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
## 2012-04-15 ENCOUNTER — Ambulatory Visit: Payer: Federal, State, Local not specified - PPO | Admitting: Family Medicine

## 2012-04-16 ENCOUNTER — Ambulatory Visit: Payer: Federal, State, Local not specified - PPO | Admitting: Family Medicine

## 2012-04-17 ENCOUNTER — Other Ambulatory Visit: Payer: Self-pay | Admitting: Family Medicine

## 2012-05-13 ENCOUNTER — Encounter: Payer: Self-pay | Admitting: Sports Medicine

## 2012-05-13 ENCOUNTER — Ambulatory Visit (INDEPENDENT_AMBULATORY_CARE_PROVIDER_SITE_OTHER): Payer: Federal, State, Local not specified - PPO | Admitting: Sports Medicine

## 2012-05-13 VITALS — BP 135/83 | HR 101 | Wt 237.0 lb

## 2012-05-13 DIAGNOSIS — M5416 Radiculopathy, lumbar region: Secondary | ICD-10-CM | POA: Insufficient documentation

## 2012-05-13 DIAGNOSIS — IMO0002 Reserved for concepts with insufficient information to code with codable children: Secondary | ICD-10-CM

## 2012-05-13 NOTE — Assessment & Plan Note (Addendum)
Symptoms are classic for right-sided L5 versus S1 radiculitis. She has already been to formal physical therapy, oral steroids, gabapentin, muscle relaxers, and has had lumbar epidural steroid injections which have been efficacious. I'm going to send her for a repeat right-sided L5-S1 interlaminar epidural steroid injection, and I would like to see her back a week after the injection to see him things are going. Patient was advised to bring her MRI CD with her to the appointment.

## 2012-05-13 NOTE — Progress Notes (Addendum)
  Subjective:    I'm seeing this patient as a consultation for:  Dr. Ivan Anchors  CC: Low back pain and right leg pain.  HPI: This very pleasant 47 year old female with a long history of pain which he localizes in her low back radiating down her right leg down the posterior aspect of her right thigh, right leg, causing numbness and tingling in the lateral 4 toes of her right foot. Pain is worse when sitting down, riding in a car, and with flexion of the spine but not with Valsalva. She has already been through months of formal physical therapy and oral steroids, and is more recently been getting epidural injections. She does not know what level she has been getting them at. The injections have worked very well. Her last of which was probably more than one year ago. Pain is moderate.  Past medical history, Surgical history, Family history not pertinant except as noted below, Social history, Allergies, and medications have been entered into the medical record, reviewed, and no changes needed.   Review of Systems: No headache, visual changes, nausea, vomiting, diarrhea, constipation, dizziness, abdominal pain, skin rash, fevers, chills, night sweats, weight loss, swollen lymph nodes, body aches, joint swelling, muscle aches, chest pain, shortness of breath, mood changes, visual or auditory hallucinations.   Objective:   General: Well Developed, well nourished, and in no acute distress.  Neuro/Psych: Alert and oriented x3, extra-ocular muscles intact, able to move all 4 extremities, sensation grossly intact. Skin: Warm and dry, no rashes noted.  Respiratory: Not using accessory muscles, speaking in full sentences, trachea midline.  Cardiovascular: Pulses palpable, no extremity edema. Abdomen: Does not appear distended. Back Exam:  Inspection: Unremarkable  Motion: Flexion 45 deg, Extension 45 deg, Side Bending to 45 deg bilaterally,  Rotation to 45 deg bilaterally  SLR laying: Negative  XSLR laying:  Negative  Palpable tenderness: None. FABER: negative. Sensory change: Gross sensation intact to all lumbar and sacral dermatomes.  Reflexes: 2+ at both patellar tendons, 2+ at achilles tendons, Babinski's downgoing.  Strength at foot  Plantar-flexion: 5/5 Dorsi-flexion: 5/5 Eversion: 5/5 Inversion: 5/5  Leg strength  Quad: 5/5 Hamstring: 5/5 Hip flexor: 5/5 Hip abductors: 5/5  Gait unremarkable.  I did review her MRI, there is multilevel degenerative disc disease worse at the L4-L5 and L5-S1 levels, there is bilateral foraminal stenosis at both levels, and at the L5-S1 level there is right-sided facet spondylosis. The MRI disc was returned to the patient. Impression and Recommendations:   This case required medical decision making of moderate complexity.

## 2012-05-15 ENCOUNTER — Ambulatory Visit
Admission: RE | Admit: 2012-05-15 | Discharge: 2012-05-15 | Disposition: A | Discharge status: Home / Self Care | Payer: Federal, State, Local not specified - PPO | Source: Ambulatory Visit | Attending: Sports Medicine | Admitting: Sports Medicine

## 2012-05-15 VITALS — BP 120/83 | HR 88

## 2012-05-15 DIAGNOSIS — R609 Edema, unspecified: Secondary | ICD-10-CM

## 2012-05-15 DIAGNOSIS — J301 Allergic rhinitis due to pollen: Secondary | ICD-10-CM

## 2012-05-15 DIAGNOSIS — L2089 Other atopic dermatitis: Secondary | ICD-10-CM

## 2012-05-15 DIAGNOSIS — K589 Irritable bowel syndrome without diarrhea: Secondary | ICD-10-CM

## 2012-05-15 DIAGNOSIS — M5416 Radiculopathy, lumbar region: Secondary | ICD-10-CM

## 2012-05-15 MED ORDER — IOHEXOL 180 MG/ML  SOLN
1.0000 mL | Freq: Once | INTRAMUSCULAR | Status: AC | PRN
  Administered 2012-05-15: 1 mL via EPIDURAL

## 2012-05-15 MED ORDER — METHYLPREDNISOLONE ACETATE 40 MG/ML INJ SUSP (RADIOLOG
120.0000 mg | Freq: Once | INTRAMUSCULAR | Status: AC
  Administered 2012-05-15: 120 mg via EPIDURAL

## 2012-06-25 ENCOUNTER — Other Ambulatory Visit: Payer: Self-pay | Admitting: Family Medicine

## 2012-06-25 DIAGNOSIS — T148XXA Other injury of unspecified body region, initial encounter: Secondary | ICD-10-CM

## 2012-07-10 ENCOUNTER — Ambulatory Visit: Payer: Federal, State, Local not specified - PPO | Admitting: Obstetrics & Gynecology

## 2012-07-17 ENCOUNTER — Ambulatory Visit (INDEPENDENT_AMBULATORY_CARE_PROVIDER_SITE_OTHER): Payer: Federal, State, Local not specified - PPO | Admitting: Obstetrics & Gynecology

## 2012-07-17 ENCOUNTER — Encounter: Payer: Self-pay | Admitting: Obstetrics & Gynecology

## 2012-07-17 VITALS — BP 148/96 | HR 114 | Resp 17 | Ht 62.0 in | Wt 241.0 lb

## 2012-07-17 DIAGNOSIS — Z124 Encounter for screening for malignant neoplasm of cervix: Secondary | ICD-10-CM

## 2012-07-17 DIAGNOSIS — Z1151 Encounter for screening for human papillomavirus (HPV): Secondary | ICD-10-CM

## 2012-07-17 DIAGNOSIS — Z0000 Encounter for general adult medical examination without abnormal findings: Secondary | ICD-10-CM

## 2012-07-17 DIAGNOSIS — Z01419 Encounter for gynecological examination (general) (routine) without abnormal findings: Secondary | ICD-10-CM

## 2012-07-17 NOTE — Progress Notes (Signed)
Subjective:    Rhonda Oneal is a 47 y.o. female who presents for an annual exam. The patient has no complaints today. She never started the OCPs for her dysmenorrhea (which she still has). She is considering Mirena for this. Her husband has had a vasectomy.The patient is sexually active. GYN screening history: last pap: was normal. The patient wears seatbelts: yes. The patient participates in regular exercise: no. Has the patient ever been transfused or tattooed?: no. The patient reports that there is not domestic violence in her life.   Menstrual History: OB History   Grav Para Term Preterm Abortions TAB SAB Ect Mult Living   2 2              Menarche age: 74  Patient's last menstrual period was 06/17/2012.    The following portions of the patient's history were reviewed and updated as appropriate: allergies, current medications, past family history, past medical history, past social history, past surgical history and problem list.  Review of Systems A comprehensive review of systems was negative.  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   Objective:    BP 148/96  Pulse 114  Resp 17  Ht 5\' 2"  (1.575 m)  Wt 241 lb (109.317 kg)  BMI 44.07 kg/m2  LMP 06/17/2012  General Appearance:    Alert, cooperative, no distress, appears stated age  Head:    Normocephalic, without obvious abnormality, atraumatic  Eyes:    PERRL, conjunctiva/corneas clear, EOM's intact, fundi    benign, both eyes  Ears:    Normal TM's and external ear canals, both ears  Nose:   Nares normal, septum midline, mucosa normal, no drainage    or sinus tenderness  Throat:   Lips, mucosa, and tongue normal; teeth and gums normal  Neck:   Supple, symmetrical, trachea midline, no adenopathy;    thyroid:  no enlargement/tenderness/nodules; no carotid   bruit or JVD  Back:     Symmetric, no curvature, ROM normal, no CVA tenderness  Lungs:     Clear to auscultation bilaterally, respirations unlabored   Chest Wall:    No tenderness or deformity   Heart:    Regular rate and rhythm, S1 and S2 normal, no murmur, rub   or gallop  Breast Exam:    No tenderness, masses, or nipple abnormality  Abdomen:     Soft, non-tender, bowel sounds active all four quadrants,    no masses, no organomegaly  Genitalia:    Normal female without lesion, discharge or tenderness, 6 week size uterus, non-palpable adnexa     Extremities:   Extremities normal, atraumatic, no cyanosis or edema  Pulses:   2+ and symmetric all extremities  Skin:   Skin color, texture, turgor normal, no rashes or lesions  Lymph nodes:   Cervical, supraclavicular, and axillary nodes normal  Neurologic:   CNII-XII intact, normal strength, sensation and reflexes    throughout  .    Assessment:    Healthy female exam.    Plan:     Thin prep Pap smear. with HPV testing Schedule mammogram

## 2012-08-01 ENCOUNTER — Telehealth: Payer: Self-pay | Admitting: *Deleted

## 2012-08-01 NOTE — Telephone Encounter (Signed)
Copy of recent pap smear sent to Primus Bravo, NP @ [facility]

## 2012-08-15 ENCOUNTER — Ambulatory Visit: Payer: Federal, State, Local not specified - PPO | Admitting: Sports Medicine

## 2012-08-21 ENCOUNTER — Ambulatory Visit: Payer: Federal, State, Local not specified - PPO | Admitting: Sports Medicine

## 2012-08-25 ENCOUNTER — Ambulatory Visit: Payer: Federal, State, Local not specified - PPO | Admitting: Sports Medicine

## 2012-08-25 DIAGNOSIS — Z0289 Encounter for other administrative examinations: Secondary | ICD-10-CM

## 2012-08-26 ENCOUNTER — Ambulatory Visit (INDEPENDENT_AMBULATORY_CARE_PROVIDER_SITE_OTHER): Payer: Federal, State, Local not specified - PPO | Admitting: Sports Medicine

## 2012-08-26 ENCOUNTER — Encounter: Payer: Self-pay | Admitting: Sports Medicine

## 2012-08-26 VITALS — BP 151/91 | HR 96 | Wt 242.0 lb

## 2012-08-26 DIAGNOSIS — IMO0002 Reserved for concepts with insufficient information to code with codable children: Secondary | ICD-10-CM

## 2012-08-26 DIAGNOSIS — M5416 Radiculopathy, lumbar region: Secondary | ICD-10-CM

## 2012-08-26 NOTE — Assessment & Plan Note (Signed)
Excellent response to her right-sided L5-S1 interlaminar epidural steroid injection. At this point we are going to repeat, his last injection was almost 4 months ago. I would like to try transforaminal this time.

## 2012-08-26 NOTE — Progress Notes (Signed)
  Subjective:    CC: Followup  HPI: This pleasant 47 year old female has multilevel degenerative disc disease, dictated on a prior note. She recently had a right-sided L5-S1 interlaminar epidural steroid injection, and was pain-free for 3 months. She would like to repeat this.  Past medical history, Surgical history, Family history not pertinant except as noted below, Social history, Allergies, and medications have been entered into the medical record, reviewed, and no changes needed.   Review of Systems: No fevers, chills, night sweats, weight loss, chest pain, or shortness of breath.   Objective:    General: Well Developed, well nourished, and in no acute distress.  Neuro: Alert and oriented x3, extra-ocular muscles intact, sensation grossly intact.  HEENT: Normocephalic, atraumatic, pupils equal round reactive to light, neck supple, no masses, no lymphadenopathy, thyroid nonpalpable.  Skin: Warm and dry, no rashes. Cardiac: Regular rate and rhythm, no murmurs rubs or gallops, no lower extremity edema.  Respiratory: Clear to auscultation bilaterally. Not using accessory muscles, speaking in full sentences.  Impression and Recommendations:

## 2012-08-27 ENCOUNTER — Ambulatory Visit
Admission: RE | Admit: 2012-08-27 | Discharge: 2012-08-27 | Disposition: A | Discharge status: Home / Self Care | Payer: Federal, State, Local not specified - PPO | Source: Ambulatory Visit | Attending: Sports Medicine | Admitting: Sports Medicine

## 2012-08-27 MED ORDER — METHYLPREDNISOLONE ACETATE 40 MG/ML INJ SUSP (RADIOLOG
120.0000 mg | Freq: Once | INTRAMUSCULAR | Status: AC
  Administered 2012-08-27: 120 mg via EPIDURAL

## 2012-08-27 MED ORDER — IOHEXOL 180 MG/ML  SOLN
1.0000 mL | Freq: Once | INTRAMUSCULAR | Status: AC | PRN
  Administered 2012-08-27: 1 mL via EPIDURAL

## 2012-10-14 ENCOUNTER — Other Ambulatory Visit: Payer: Self-pay | Admitting: Family Medicine

## 2012-11-10 ENCOUNTER — Other Ambulatory Visit: Payer: Self-pay | Admitting: Sports Medicine

## 2012-11-10 DIAGNOSIS — M5416 Radiculopathy, lumbar region: Secondary | ICD-10-CM

## 2012-11-10 NOTE — Progress Notes (Signed)
Three-month response to initial interlaminar right-sided L5-S1 epidural, only one week response to subsequent right-sided L5-S1 transforaminal injection. We are going to repeat a right-sided L5-S1 interlaminar epidural steroid injection, patient prefers Dr. Bonnielee Haff.

## 2012-11-10 NOTE — Progress Notes (Signed)
Left message on the vm  [facility] of GI to schedule patient for epidural. Estelle June

## 2012-11-12 ENCOUNTER — Encounter: Payer: Self-pay | Admitting: Sports Medicine

## 2012-11-12 ENCOUNTER — Ambulatory Visit (INDEPENDENT_AMBULATORY_CARE_PROVIDER_SITE_OTHER): Payer: Federal, State, Local not specified - PPO

## 2012-11-12 ENCOUNTER — Ambulatory Visit (INDEPENDENT_AMBULATORY_CARE_PROVIDER_SITE_OTHER): Payer: Federal, State, Local not specified - PPO | Admitting: Sports Medicine

## 2012-11-12 VITALS — BP 129/76 | HR 108 | Wt 247.0 lb

## 2012-11-12 DIAGNOSIS — M4802 Spinal stenosis, cervical region: Secondary | ICD-10-CM | POA: Insufficient documentation

## 2012-11-12 DIAGNOSIS — M5412 Radiculopathy, cervical region: Secondary | ICD-10-CM | POA: Insufficient documentation

## 2012-11-12 DIAGNOSIS — M5416 Radiculopathy, lumbar region: Secondary | ICD-10-CM

## 2012-11-12 DIAGNOSIS — M47812 Spondylosis without myelopathy or radiculopathy, cervical region: Secondary | ICD-10-CM

## 2012-11-12 DIAGNOSIS — M5030 Other cervical disc degeneration, unspecified cervical region: Secondary | ICD-10-CM

## 2012-11-12 DIAGNOSIS — IMO0002 Reserved for concepts with insufficient information to code with codable children: Secondary | ICD-10-CM

## 2012-11-12 MED ORDER — AMITRIPTYLINE HCL 50 MG PO TABS: ORAL_TABLET | ORAL | Status: DC

## 2012-11-12 MED ORDER — GABAPENTIN 800 MG PO TABS: ORAL_TABLET | ORAL | Status: DC

## 2012-11-12 NOTE — Assessment & Plan Note (Signed)
Clinically referable to the left C6 nerve root. X-rays. Gabapentin as below, Elavil. If no better we can consider an MRI for interventional injection planning.

## 2012-11-12 NOTE — Assessment & Plan Note (Signed)
Symptoms previously were predominately referable to the right S1 nerve root. A previous L5-S1 interlaminar epidural steroid injection was more effective than a subsequent right-sided L5-S1 transforaminal epidural steroid injection. Currently symptoms are referable now to the L5 nerve root on the right side. We are going to proceed with a right-sided L5-S1 interlaminar epidural steroid injection. I'm also going to increase her gabapentin to 800 mg breakfast and lunch, 1600 mg at bedtime. I'm also going to add Elavil per patient request.

## 2012-11-12 NOTE — Progress Notes (Signed)
  Subjective:    CC: Followup  HPI: Right lumbar radiculitis: Now status post 2 epidurals, the first of which was a right-sided L5-S1 intralaminar, the second of which was a right-sided L5-S1 transforaminal. She notes a better response to the L5-S1 intralaminar and does desire a repeat. She does have degenerative disc disease at the L4-L5 as well as L5-S1 levels, previously her pain ran of the posterior aspect of the right thigh, right lower leg, to the side of the foot, today it runs down the lateral aspect of the right thigh, anterior aspect of the right lower leg. Worse when sitting for long periods of time, worse with flexion. Currently taking 600 mg of gabapentin 3 times a day.  Left shoulder pain: Starts in the neck, radiates down the dorsal scapular region, worse when looking down for long periods of time.  Past medical history, Surgical history, Family history not pertinant except as noted below, Social history, Allergies, and medications have been entered into the medical record, reviewed, and no changes needed.   Review of Systems: No fevers, chills, night sweats, weight loss, chest pain, or shortness of breath.   Objective:    General: Well Developed, well nourished, and in no acute distress.  Neuro: Alert and oriented x3, extra-ocular muscles intact, sensation grossly intact.  HEENT: Normocephalic, atraumatic, pupils equal round reactive to light, neck supple, no masses, no lymphadenopathy, thyroid nonpalpable.  Skin: Warm and dry, no rashes. Cardiac: Regular rate and rhythm, no murmurs rubs or gallops, no lower extremity edema.  Respiratory: Clear to auscultation bilaterally. Not using accessory muscles, speaking in full sentences.  Impression and Recommendations:

## 2012-11-14 ENCOUNTER — Ambulatory Visit
Admission: RE | Admit: 2012-11-14 | Discharge: 2012-11-14 | Disposition: A | Discharge status: Home / Self Care | Payer: Federal, State, Local not specified - PPO | Source: Ambulatory Visit | Attending: Sports Medicine | Admitting: Sports Medicine

## 2012-11-14 ENCOUNTER — Other Ambulatory Visit: Payer: Federal, State, Local not specified - PPO

## 2012-11-14 DIAGNOSIS — M5416 Radiculopathy, lumbar region: Secondary | ICD-10-CM

## 2012-11-14 MED ORDER — METHYLPREDNISOLONE ACETATE 40 MG/ML INJ SUSP (RADIOLOG
120.0000 mg | Freq: Once | INTRAMUSCULAR | Status: AC
  Administered 2012-11-14: 120 mg via EPIDURAL

## 2012-11-14 MED ORDER — IOHEXOL 180 MG/ML  SOLN
1.0000 mL | Freq: Once | INTRAMUSCULAR | Status: AC | PRN
  Administered 2012-11-14: 1 mL via EPIDURAL

## 2012-11-15 ENCOUNTER — Emergency Department (INDEPENDENT_AMBULATORY_CARE_PROVIDER_SITE_OTHER)
Admission: EM | Admit: 2012-11-15 | Discharge: 2012-11-15 | Disposition: A | Discharge status: Home / Self Care | Payer: Federal, State, Local not specified - PPO | Source: Home / Self Care | Attending: Emergency Medicine | Admitting: Emergency Medicine

## 2012-11-15 ENCOUNTER — Emergency Department (INDEPENDENT_AMBULATORY_CARE_PROVIDER_SITE_OTHER): Payer: Federal, State, Local not specified - PPO

## 2012-11-15 DIAGNOSIS — M79671 Pain in right foot: Secondary | ICD-10-CM

## 2012-11-15 DIAGNOSIS — M79609 Pain in unspecified limb: Secondary | ICD-10-CM

## 2012-11-15 DIAGNOSIS — M25579 Pain in unspecified ankle and joints of unspecified foot: Secondary | ICD-10-CM

## 2012-11-15 DIAGNOSIS — R52 Pain, unspecified: Secondary | ICD-10-CM

## 2012-11-15 DIAGNOSIS — M25571 Pain in right ankle and joints of right foot: Secondary | ICD-10-CM

## 2012-11-15 LAB — COMPREHENSIVE METABOLIC PANEL
ALT: 14 U/L (ref 0–35)
AST: 13 U/L (ref 0–37)
Albumin: 3.6 g/dL (ref 3.5–5.2)
Alkaline Phosphatase: 47 U/L (ref 39–117)
BUN: 9 mg/dL (ref 6–23)
CO2: 27 mEq/L (ref 19–32)
Calcium: 9 mg/dL (ref 8.4–10.5)
Chloride: 103 mEq/L (ref 96–112)
Creat: 0.63 mg/dL (ref 0.50–1.10)
Glucose, Bld: 107 mg/dL — ABNORMAL HIGH (ref 70–99)
Potassium: 4 mEq/L (ref 3.5–5.3)
Sodium: 137 mEq/L (ref 135–145)
Total Bilirubin: 0.6 mg/dL (ref 0.3–1.2)
Total Protein: 7 g/dL (ref 6.0–8.3)

## 2012-11-15 LAB — URIC ACID: Uric Acid, Serum: 3.4 mg/dL (ref 2.4–7.0)

## 2012-11-15 MED ORDER — TRAMADOL-ACETAMINOPHEN 37.5-325 MG PO TABS: ORAL_TABLET | ORAL | Status: DC

## 2012-11-15 NOTE — ED Provider Notes (Signed)
CSN: [id number]     Arrival date & time 11/15/12  1139 History   First MD Initiated Contact with Patient 11/15/12 1149     Chief Complaint  Patient presents with  . Foot Pain    1.5 weeks  . Leg Pain    1.5 weeks   (Consider location/radiation/quality/duration/timing/severity/associated sxs/prior Treatment) Patient is a 47 y.o. female presenting with ankle pain. The history is provided by the patient (And also her adult daughter who is here with her).  Ankle Pain Location:  Ankle and foot Time since incident:  10 days Lower extremity injury: Patient is unsure, but recalls no specific injury.   Foot location:  R foot Pain details:    Quality:  Sharp and throbbing   Radiates to:  Does not radiate   Severity:  Severe   Onset quality:  Gradual   Duration:  10 days   Timing:  Constant   Progression:  Unchanged Chronicity:  New Dislocation: no   Foreign body present:  No foreign bodies Prior injury to area:  Unable to specify Relieved by:  Rest Worsened by:  Bearing weight Associated symptoms: no fever    Past medical history significant for chronic pain from fibromyalgia, treated by Glynn Octave at [facility] family practice. She states she saw a rheumatologist in 2013, and to her knowledge the workup was negative. She does not remember the name of that rheumatologist, and she states she prefers not following up with that rheumatologist. Past Medical History  Diagnosis Date  . Morbid obesity   . Fibromyalgia   . IBS (irritable bowel syndrome)   . Arthritis   . DERMATITIS, ATOPIC 02/09/2010    Qualifier: Diagnosis of  By: Orson Aloe MD, Tinnie Gens    . EDEMA LEG 03/22/2010    Qualifier: Diagnosis of  By: Orson Aloe MD, Tinnie Gens    . Asthma    Past Surgical History  Procedure Laterality Date  . Cesarean section    . Tonsillectomy and adenoidectomy    . Wisdom tooth extraction     Family History  Problem Relation Age of Onset  . Diabetes Mother   . Cancer Mother     Lung   . Breast cancer Maternal Aunt   . Ovarian cancer Paternal Aunt   . Hyperlipidemia Father    History  Substance Use Topics  . Smoking status: Never Smoker   . Smokeless tobacco: Never Used  . Alcohol Use: No   OB History   Grav Para Term Preterm Abortions TAB SAB Ect Mult Living   2 2             Review of Systems  Constitutional: Negative for fever.  HENT: Negative for rhinorrhea.   Eyes: Negative for visual disturbance.  Respiratory: Negative for cough, chest tightness, shortness of breath and wheezing.   Cardiovascular: Positive for leg swelling (Chronic mild bilateral leg edema). Negative for chest pain and palpitations.  Gastrointestinal: Negative.   Genitourinary: Negative.   Skin: Negative for rash and wound.  Neurological: Negative for seizures and syncope.  Psychiatric/Behavioral: Negative for confusion.  All other systems reviewed and are negative.    Allergies  Review of patient's allergies indicates no known allergies.  Home Medications   Current Outpatient Rx  Name  Route  Sig  Dispense  Refill  . amitriptyline (ELAVIL) 50 MG tablet      One half tab PO qHS for a week, then one tab PO qHS.   90 tablet   3   .  baclofen (LIORESAL) 10 MG tablet   Oral   Take 10 mg by mouth 3 (three) times daily.         Marland Kitchen CINNAMON PO   Oral   Take 1,000 mg by mouth 2 (two) times daily.           . Fluticasone-Salmeterol (ADVAIR) 100-50 MCG/DOSE AEPB   Inhalation   Inhale 1 puff into the lungs every 12 (twelve) hours.         . gabapentin (NEURONTIN) 800 MG tablet      One tab by mouth at breakfast, midday, 2 tabs at bedtime.   120 tablet   3   . Ginger, Zingiber officinalis, (GINGER ROOT PO)   Oral   Take 1 tablet by mouth daily.           Marland Kitchen lubiprostone (AMITIZA) 8 MCG capsule   Oral   Take 8 mcg by mouth daily with breakfast.           . montelukast (SINGULAIR) 10 MG tablet   Oral   Take 10 mg by mouth at bedtime.         . Multiple  Vitamin (MULTIVITAMIN) tablet   Oral   Take 1 tablet by mouth daily.           Marland Kitchen omeprazole (PRILOSEC) 20 MG capsule   Oral   Take 20 mg by mouth daily.         . traMADol-acetaminophen (ULTRACET) 37.5-325 MG per tablet      1 or 2 every 4-6 hours as needed for moderate-severe pain   15 tablet   0    BP 113/83  Pulse 85  Temp(Src) 98.1 F (36.7 C) (Oral)  Ht 5\' 2"  (1.575 m)  Wt 240 lb (108.863 kg)  BMI 43.89 kg/m2  SpO2 100%  LMP 10/20/2012 Physical Exam  Nursing note and vitals reviewed. Constitutional: She is oriented to person, place, and time. She appears well-developed and well-nourished. No distress.  She arrived walking with the help of crutches. Uncomfortable from right ankle and foot pain, but no acute distress. She does not appear uncomfortable when she is sitting  HENT:  Head: Normocephalic and atraumatic.  Eyes: Pupils are equal, round, and reactive to light. No scleral icterus.  Neck: Neck supple.  Cardiovascular: Normal rate, regular rhythm and normal heart sounds.   No murmur heard. Pulmonary/Chest: Effort normal and breath sounds normal. No respiratory distress. She has no wheezes.  Abdominal: Soft. There is no tenderness.  Musculoskeletal:       Right ankle: She exhibits swelling. She exhibits normal range of motion, no ecchymosis, no laceration and normal pulse. Tenderness. Lateral malleolus and head of 5th metatarsal tenderness found. Achilles tendon normal. Achilles tendon exhibits no defect and normal Thompson's test results.       Right foot: She exhibits decreased range of motion, tenderness (Diffuse), bony tenderness and swelling. She exhibits normal capillary refill, no deformity and no laceration.  Right ankle and foot diffusely tender. 1+ edema. Left lower extremity trace edema.  No calf tenderness , Cords, heat, induration or fluctuance. No red streaks. Homans sign negative bilaterally.  Neurological: She is alert and oriented to person,  place, and time. No cranial nerve deficit.  Skin: Skin is warm and dry. No rash noted. She is not diaphoretic.  Psychiatric: She has a normal mood and affect.    ED Course  Procedures (including critical care time) Labs Review Labs Reviewed  URIC ACID  COMPREHENSIVE METABOLIC  PANEL   Imaging Review Dg Ankle Complete Right  11/15/2012   *RADIOLOGY REPORT*  Clinical Data: Right foot pain  RIGHT ANKLE - COMPLETE 3+ VIEW  Comparison: Concurrently obtained radiographs of the right foot  Findings: Three views of the ankle demonstrate no acute fracture, malalignment or joint effusion.  Normal bony mineralization.  No lytic or blastic osseous lesion.  The ankle mortise is congruent. The talar dome appears intact.  IMPRESSION: Negative radiographs of the ankle.   Original Report Authenticated By: Malachy Moan, M.D.   Dg Epidurography  11/14/2012   CLINICAL DATA:  Lumbosacral spondylosis without myelopathy. Significant relief after the previous injection, without side effect or complication. She felt like the interlaminar approach was more efficacious than the trans foraminal approach. Partial recurrence of symptoms. The patient wishes to repeat.  FLUOROSCOPY TIME:  36 seconds  PROCEDURE: The procedure, risks, benefits, and alternatives were explained to the patient. Questions regarding the procedure were encouraged and answered. The patient understands and consents to the procedure.  LUMBAR EPIDURAL INJECTION: An interlaminar approach was performed on the right at L5-S1. Operator donned sterile gloves and mask. The overlying skin was cleansed and anesthetized. A 20 gauge Crawford epidural needle was advanced using loss-of-resistance technique.  DIAGNOSTIC EPIDURAL INJECTION: Injection of Omnipaque 180 shows a good epidural pattern with spread above and below the level of needle placement. No intrathecal or vascular opacification is seen.  THERAPEUTIC EPIDURAL INJECTION: 120mg  of Depo-Medrol mixed with  5ml lidocaine 1% were instilled. The procedure was well-tolerated, and the patient was discharged thirty minutes following the injection in good condition.  COMPLICATIONS: None  IMPRESSION: Technically successful epidural injection on the right at L5-S1.   Electronically Signed   By: Oley Balm M.D.   On: 11/14/2012 10:48   Dg Foot Complete Right  11/15/2012   *RADIOLOGY REPORT*  Clinical Data: Foot and leg pain  RIGHT FOOT COMPLETE - 3+ VIEW  Comparison: Concurrently obtained radiographs of the ankle  Findings: No acute fracture, malalignment or focal soft tissue abnormality.  No ankle joint effusion.  Normal bony mineralization. No lytic or blastic osseous lesion.  IMPRESSION: Negative radiographs of the right foot.   Original Report Authenticated By: Malachy Moan, M.D.    MDM   1. Acute ankle pain, right   2. Acute foot pain, right    Right ankle and foot pain for 10 days. Unsure if it was injury related although no trauma is recalled by the patient. X-rays reviewed, x-rays right foot and ankle negative for acute abnormality. Most likely, diagnosis is sprain and/or contusion right ankle and foot It's possible that arthritis is the cause of the pain. Also in the differential is gout, although the ankle and foot is not particularly hot or red. In my opinion, DVT is extremely unlikely as she has no significant increase in edema. No cords or heat. Homans sign negative. No history of prolonged immobilization or travel. We discussed at length with patient and daughter. After risks, benefits alternatives discussed, they agree with the following: At first, I ordered a right ankle/foot Cam Walker, but after trying to fit this, patient declined it and stated he actually made the pain worse.--Then, we placed right Ace bandage. May continue crutches over the weekend to avoid weightbearing.   Draw uric acid and CMP today, which will be followed up when she sees Dr. Benjamin Stain for reevaluation in  2 days. Red flags discussed. Reviewed her complex medication list. She started taking high-dose ibuprofen 800 3 times  a day which helps a little. I agreed to write for a small amount of Ultracet, but precautions discussed. Do not take this with the oxycodone. Followup with Dr. Benjamin Stain 2 days. Precautions discussed. Red flags discussed. Questions invited and answered. Patient voiced understanding and agreement.   Lajean Manes, MD 11/15/12 [phone]

## 2012-11-15 NOTE — ED Notes (Signed)
Rhonda Oneal complains of right foot and right leg pain for 1.5 weeks. The pain is an aching, throbbing and sharp pain that is a 10/10. Denies injury.

## 2012-11-21 ENCOUNTER — Telehealth: Payer: Self-pay | Admitting: Emergency Medicine

## 2012-11-28 ENCOUNTER — Ambulatory Visit (INDEPENDENT_AMBULATORY_CARE_PROVIDER_SITE_OTHER): Payer: Federal, State, Local not specified - PPO | Admitting: Sports Medicine

## 2012-11-28 ENCOUNTER — Encounter: Payer: Self-pay | Admitting: Sports Medicine

## 2012-11-28 ENCOUNTER — Other Ambulatory Visit: Payer: Self-pay | Admitting: Family Medicine

## 2012-11-28 VITALS — BP 114/72 | HR 114 | Wt 247.0 lb

## 2012-11-28 DIAGNOSIS — M7750 Other enthesopathy of unspecified foot: Secondary | ICD-10-CM

## 2012-11-28 DIAGNOSIS — M5412 Radiculopathy, cervical region: Secondary | ICD-10-CM

## 2012-11-28 DIAGNOSIS — M25571 Pain in right ankle and joints of right foot: Secondary | ICD-10-CM | POA: Insufficient documentation

## 2012-11-28 DIAGNOSIS — IMO0002 Reserved for concepts with insufficient information to code with codable children: Secondary | ICD-10-CM

## 2012-11-28 DIAGNOSIS — M5416 Radiculopathy, lumbar region: Secondary | ICD-10-CM

## 2012-11-28 NOTE — Progress Notes (Signed)
  Subjective:    CC: Foot pain  HPI: This is a pleasant 47 year old female, I've been seeing her for multiple issues.  Cervical radiculitis: Resolved with physical therapy, steroids, NSAIDs, gabapentin, and amitriptyline.  Lumbar radiculitis: Symptoms improved greatly with L5-S1 interlaminar epidural steroid injection, she's had 3 epidurals, the interlaminar's were much more effective than the transforaminal.  Right foot and ankle pain: Localized over the sinus Tarsi, worse with inhalation, worse with eversion and dorsiflexion of the foot. 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 She does occasionally get radiation up her leg, and thigh to the back. When it does radiate across the leg it goes to the anterior lower leg, and dorsum of the foot  Past medical history, Surgical history, Family history not pertinant except as noted below, Social history, Allergies, and medications have been entered into the medical record, reviewed, and no changes needed.   Review of Systems: No fevers, chills, night sweats, weight loss, chest pain, or shortness of breath.   Objective:    General: Well Developed, well nourished, and in no acute distress.  Neuro: Alert and oriented x3, extra-ocular muscles intact, sensation grossly intact.  HEENT: Normocephalic, atraumatic, pupils equal round reactive to light, neck supple, no masses, no lymphadenopathy, thyroid nonpalpable.  Skin: Warm and dry, no rashes. Cardiac: Regular rate and rhythm, no murmurs rubs or gallops, no lower extremity edema.  Respiratory: Clear to auscultation bilaterally. Not using accessory muscles, speaking in full sentences. Right Foot: No visible erythema or swelling. Range of motion is full in all directions. Strength is 5/5 in all directions. No hallux valgus. No pes cavus or pes planus. No abnormal callus noted. No pain over the navicular  prominence, or base of fifth metatarsal. No tenderness to palpation of the calcaneal insertion of plantar fascia. No pain at the Achilles insertion. No pain over the calcaneal bursa. No pain of the retrocalcaneal bursa. Tender to palpation over the sinus tarsi with reproduction of pain with passive inversion, external rotation, and dorsiflexion of the foot. No hallux rigidus or limitus. No tenderness palpation over interphalangeal joints. No pain with compression of the metatarsal heads. Neurovascularly intact distally. When dorsiflexing her foot she did get radicular symptoms.  Impression and Recommendations:

## 2012-11-28 NOTE — Assessment & Plan Note (Signed)
Formal physical therapy. She will return for custom orthotics.

## 2012-11-28 NOTE — Assessment & Plan Note (Signed)
Interlaminar right-sided L5-S1 injection was effective for lower lumbar symptoms, unfortunately I think she's also having some symptoms referable to the L4-L5 level. At this point we will proceed with L4-L5 interlaminar epidural steroid injection on the right side. Her sinus Tarsi symptoms are confounding the picture. We will treat all at once.

## 2012-11-28 NOTE — Assessment & Plan Note (Signed)
Resolved with conservative measures, PT, amitriptyline, gabapentin.

## 2012-12-01 ENCOUNTER — Other Ambulatory Visit: Payer: Self-pay | Admitting: Family Medicine

## 2012-12-08 ENCOUNTER — Ambulatory Visit: Payer: Federal, State, Local not specified - PPO | Admitting: Physical Therapy

## 2012-12-08 DIAGNOSIS — M2550 Pain in unspecified joint: Secondary | ICD-10-CM | POA: Insufficient documentation

## 2012-12-08 DIAGNOSIS — M7750 Other enthesopathy of unspecified foot: Secondary | ICD-10-CM

## 2012-12-11 ENCOUNTER — Encounter: Payer: Federal, State, Local not specified - PPO | Admitting: Physical Therapy

## 2012-12-12 ENCOUNTER — Ambulatory Visit (INDEPENDENT_AMBULATORY_CARE_PROVIDER_SITE_OTHER): Payer: Federal, State, Local not specified - PPO | Admitting: Sports Medicine

## 2012-12-12 ENCOUNTER — Encounter: Payer: Self-pay | Admitting: Sports Medicine

## 2012-12-12 VITALS — BP 135/87 | HR 122 | Wt 248.0 lb

## 2012-12-12 DIAGNOSIS — M25571 Pain in right ankle and joints of right foot: Secondary | ICD-10-CM

## 2012-12-12 DIAGNOSIS — M5416 Radiculopathy, lumbar region: Secondary | ICD-10-CM

## 2012-12-12 DIAGNOSIS — M7750 Other enthesopathy of unspecified foot: Secondary | ICD-10-CM

## 2012-12-12 NOTE — Assessment & Plan Note (Signed)
Excellent response to prior right-sided L5-S1 epidural. She has another one scheduled for next week.

## 2012-12-12 NOTE — Assessment & Plan Note (Signed)
Injection placed into the sinus tarsi as well as ankle joint as above. Return for custom orthotics.

## 2012-12-12 NOTE — Progress Notes (Signed)
  Subjective:    CC: Recheck foot pain  HPI: Sinus tarsi syndrome/ankle pain: Persistent, she was supposed to come back for custom orthotics, but has not done this yet and she is a 15 minute slot. She does have pain that she localizes the sinus tarsi and an MRI the results of which I have not seen, but I do have the report which suggests sinus tarsi syndrome. She requests interventional treatment she is having a lot of pain.  Right lumbar radiculitis: Did very well with right-sided L5-S1 epidurals she has another one scheduled for Wednesday.  Past medical history, Surgical history, Family history not pertinant except as noted below, Social history, Allergies, and medications have been entered into the medical record, reviewed, and no changes needed.   Review of Systems: No fevers, chills, night sweats, weight loss, chest pain, or shortness of breath.   Objective:    General: Well Developed, well nourished, and in no acute distress.  Neuro: Alert and oriented x3, extra-ocular muscles intact, sensation grossly intact.  HEENT: Normocephalic, atraumatic, pupils equal round reactive to light, neck supple, no masses, no lymphadenopathy, thyroid nonpalpable.  Skin: Warm and dry, no rashes. Cardiac: Regular rate and rhythm, no murmurs rubs or gallops, no lower extremity edema.  Respiratory: Clear to auscultation bilaterally. Not using accessory muscles, speaking in full sentences. Right Ankle: Swelling of the sinus tarsi. Data palpation in the sinus tarsi, and in the lateral joint. Range of motion is full in all directions. Strength is 5/5 in all directions. Stable lateral and medial ligaments; squeeze test and kleiger test unremarkable; Talar dome nontender; No pain at base of 5th MT; No tenderness over cuboid; No tenderness over N spot or navicular prominence No tenderness on posterior aspects of lateral and medial malleolus No sign of peroneal tendon subluxations or tenderness to  palpation Negative tarsal tunnel tinel's Able to walk 4 steps.  Procedure: Real-time Ultrasound Guided Injection of right ankle Device: GE Logiq E  Verbal informed consent obtained.  Time-out conducted.  Noted no overlying erythema, induration, or other signs of local infection.  Skin prepped in a sterile fashion.  Local anesthesia: Topical Ethyl chloride.  With sterile technique and under real time ultrasound guidance:  25-gauge needle advanced into the joint, 1 cc Kenalog 40, 3 cc lidocaine injected easily. Completed without difficulty  Pain immediately resolved suggesting accurate placement of the medication.  Advised to call if fevers/chills, erythema, induration, drainage, or persistent bleeding.  Images permanently stored and available for review in the ultrasound unit.  Impression: Technically successful ultrasound guided injection.  Impression and Recommendations:

## 2012-12-15 ENCOUNTER — Encounter: Payer: Federal, State, Local not specified - PPO | Admitting: Physical Therapy

## 2012-12-16 ENCOUNTER — Encounter: Payer: Self-pay | Admitting: Sports Medicine

## 2012-12-16 ENCOUNTER — Ambulatory Visit (INDEPENDENT_AMBULATORY_CARE_PROVIDER_SITE_OTHER): Payer: Federal, State, Local not specified - PPO | Admitting: Sports Medicine

## 2012-12-16 VITALS — BP 129/80 | HR 134 | Wt 251.0 lb

## 2012-12-16 DIAGNOSIS — M7750 Other enthesopathy of unspecified foot: Secondary | ICD-10-CM

## 2012-12-16 DIAGNOSIS — IMO0002 Reserved for concepts with insufficient information to code with codable children: Secondary | ICD-10-CM

## 2012-12-16 DIAGNOSIS — M25571 Pain in right ankle and joints of right foot: Secondary | ICD-10-CM

## 2012-12-16 DIAGNOSIS — M5416 Radiculopathy, lumbar region: Secondary | ICD-10-CM

## 2012-12-16 NOTE — Progress Notes (Signed)
    Patient was fitted for a : standard, cushioned, semi-rigid orthotic. The orthotic was heated and afterward the patient stood on the orthotic blank positioned on the orthotic stand. The patient was positioned in subtalar neutral position and 10 degrees of ankle dorsiflexion in a weight bearing stance. After completion of molding, a stable base was applied to the orthotic blank. The blank was ground to a stable position for weight bearing. Size: 7 Base: Blue EVA Additional Posting and Padding: None The patient ambulated these, and they were very comfortable.  I spent 40 minutes with this patient, greater than 50% was face-to-face time counseling regarding the below diagnosis.   

## 2012-12-16 NOTE — Assessment & Plan Note (Signed)
Excellent response to prior right-sided L5-S1 epidural. She has another one scheduled for tomorrow.

## 2012-12-16 NOTE — Assessment & Plan Note (Signed)
80% improvement in symptoms after tibiotalar joint injection several days ago. Custom orthotics as above. Return to see me in approximately 3 weeks.

## 2012-12-17 ENCOUNTER — Ambulatory Visit
Admission: RE | Admit: 2012-12-17 | Discharge: 2012-12-17 | Disposition: A | Discharge status: Home / Self Care | Payer: Federal, State, Local not specified - PPO | Source: Ambulatory Visit | Attending: Sports Medicine | Admitting: Sports Medicine

## 2012-12-17 ENCOUNTER — Other Ambulatory Visit: Payer: Federal, State, Local not specified - PPO

## 2012-12-17 VITALS — BP 117/70 | HR 109

## 2012-12-17 DIAGNOSIS — R609 Edema, unspecified: Secondary | ICD-10-CM

## 2012-12-17 DIAGNOSIS — M5412 Radiculopathy, cervical region: Secondary | ICD-10-CM

## 2012-12-17 DIAGNOSIS — K589 Irritable bowel syndrome without diarrhea: Secondary | ICD-10-CM

## 2012-12-17 DIAGNOSIS — L2089 Other atopic dermatitis: Secondary | ICD-10-CM

## 2012-12-17 DIAGNOSIS — J301 Allergic rhinitis due to pollen: Secondary | ICD-10-CM

## 2012-12-17 DIAGNOSIS — M5416 Radiculopathy, lumbar region: Secondary | ICD-10-CM

## 2012-12-17 DIAGNOSIS — M25571 Pain in right ankle and joints of right foot: Secondary | ICD-10-CM

## 2012-12-17 MED ORDER — METHYLPREDNISOLONE ACETATE 40 MG/ML INJ SUSP (RADIOLOG
120.0000 mg | Freq: Once | INTRAMUSCULAR | Status: AC
  Administered 2012-12-17: 120 mg via EPIDURAL

## 2012-12-17 MED ORDER — IOHEXOL 180 MG/ML  SOLN
1.0000 mL | Freq: Once | INTRAMUSCULAR | Status: AC | PRN
  Administered 2012-12-17: 1 mL via EPIDURAL

## 2012-12-19 ENCOUNTER — Encounter: Payer: Federal, State, Local not specified - PPO | Admitting: Physical Therapy

## 2012-12-24 DIAGNOSIS — M6281 Muscle weakness (generalized): Secondary | ICD-10-CM

## 2012-12-24 DIAGNOSIS — M25676 Stiffness of unspecified foot, not elsewhere classified: Secondary | ICD-10-CM

## 2012-12-24 DIAGNOSIS — M25579 Pain in unspecified ankle and joints of unspecified foot: Secondary | ICD-10-CM

## 2012-12-24 DIAGNOSIS — M7750 Other enthesopathy of unspecified foot: Secondary | ICD-10-CM

## 2012-12-24 DIAGNOSIS — M25673 Stiffness of unspecified ankle, not elsewhere classified: Secondary | ICD-10-CM

## 2012-12-25 ENCOUNTER — Other Ambulatory Visit: Payer: Self-pay

## 2012-12-26 DIAGNOSIS — M659 Synovitis and tenosynovitis, unspecified: Secondary | ICD-10-CM | POA: Insufficient documentation

## 2013-01-20 ENCOUNTER — Other Ambulatory Visit: Payer: Self-pay | Admitting: Sports Medicine

## 2013-01-20 ENCOUNTER — Encounter: Payer: Self-pay | Admitting: Sports Medicine

## 2013-01-20 DIAGNOSIS — D500 Iron deficiency anemia secondary to blood loss (chronic): Secondary | ICD-10-CM | POA: Insufficient documentation

## 2013-01-20 DIAGNOSIS — D573 Sickle-cell trait: Secondary | ICD-10-CM | POA: Insufficient documentation

## 2013-01-20 NOTE — Telephone Encounter (Signed)
Rhonda Oneal has had several epidurals, I want her to consider discussion with the spine surgeon, morphine as requested is not a good option.

## 2013-03-01 ENCOUNTER — Other Ambulatory Visit: Payer: Self-pay | Admitting: Sports Medicine

## 2013-03-02 NOTE — Telephone Encounter (Signed)
Refill req 

## 2013-04-10 ENCOUNTER — Other Ambulatory Visit: Payer: Self-pay | Admitting: Sports Medicine

## 2013-06-10 ENCOUNTER — Ambulatory Visit (INDEPENDENT_AMBULATORY_CARE_PROVIDER_SITE_OTHER): Payer: Federal, State, Local not specified - PPO

## 2013-06-10 ENCOUNTER — Ambulatory Visit (INDEPENDENT_AMBULATORY_CARE_PROVIDER_SITE_OTHER): Payer: Federal, State, Local not specified - PPO | Admitting: Obstetrics & Gynecology

## 2013-06-10 ENCOUNTER — Encounter: Payer: Self-pay | Admitting: Obstetrics & Gynecology

## 2013-06-10 VITALS — BP 140/99 | HR 106 | Resp 16 | Ht 62.0 in | Wt 249.0 lb

## 2013-06-10 DIAGNOSIS — N949 Unspecified condition associated with female genital organs and menstrual cycle: Secondary | ICD-10-CM

## 2013-06-10 DIAGNOSIS — N925 Other specified irregular menstruation: Secondary | ICD-10-CM

## 2013-06-10 DIAGNOSIS — N938 Other specified abnormal uterine and vaginal bleeding: Secondary | ICD-10-CM

## 2013-06-10 LAB — CBC
HCT: 33.4 % — ABNORMAL LOW (ref 36.0–46.0)
Hemoglobin: 10.9 g/dL — ABNORMAL LOW (ref 12.0–15.0)
MCH: 24.2 pg — ABNORMAL LOW (ref 26.0–34.0)
MCHC: 32.6 g/dL (ref 30.0–36.0)
MCV: 74.1 fL — ABNORMAL LOW (ref 78.0–100.0)
PLATELETS: 502 10*3/uL — AB (ref 150–400)
RBC: 4.51 MIL/uL (ref 3.87–5.11)
RDW: 17.7 % — AB (ref 11.5–15.5)
WBC: 10.5 10*3/uL (ref 4.0–10.5)

## 2013-06-10 LAB — TSH: TSH: 1.732 u[IU]/mL (ref 0.350–4.500)

## 2013-06-10 MED ORDER — MISOPROSTOL 200 MCG PO TABS: ORAL_TABLET | ORAL | Status: DC

## 2013-06-10 NOTE — Patient Instructions (Signed)
Place abnormal uterine bleeding patient instructions here.

## 2013-06-10 NOTE — Progress Notes (Signed)
   Subjective:    Patient ID: Rhonda Oneal, female    DOB: 04/16/1965, 48 y.o.   MRN: [id number]  HPI  48 yo M AA P2 (15 and 6) here today with the issue of irregular bleeding. Her periods usually last 5-7 days and monthly. But last month  her 5-7 days were very heavy. This month it is very heavy and has lasted 15 days.   Review of Systems     Objective:   Physical Exam  I removed a semi old tampon and cervix appeared normal. Moderate amount of old clots coming from os      Assessment & Plan:  DUB-check cbc, tsh, gyn u/s Possible EMBX based on lab/u/s results pretreat with cytotec

## 2013-06-12 ENCOUNTER — Other Ambulatory Visit: Payer: Self-pay | Admitting: Family Medicine

## 2013-06-23 ENCOUNTER — Ambulatory Visit (INDEPENDENT_AMBULATORY_CARE_PROVIDER_SITE_OTHER): Payer: Federal, State, Local not specified - PPO | Admitting: Obstetrics & Gynecology

## 2013-06-23 ENCOUNTER — Encounter: Payer: Self-pay | Admitting: Obstetrics & Gynecology

## 2013-06-23 VITALS — BP 155/93 | HR 112 | Resp 16 | Ht 62.0 in | Wt 250.0 lb

## 2013-06-23 DIAGNOSIS — N938 Other specified abnormal uterine and vaginal bleeding: Secondary | ICD-10-CM

## 2013-06-23 DIAGNOSIS — N949 Unspecified condition associated with female genital organs and menstrual cycle: Secondary | ICD-10-CM

## 2013-06-23 DIAGNOSIS — Z01812 Encounter for preprocedural laboratory examination: Secondary | ICD-10-CM

## 2013-06-23 NOTE — Progress Notes (Signed)
   Subjective:    Patient ID: Rhonda Oneal, female    DOB: 05/15/1965, 48 y.o.   MRN: [id number]  HPI  She is here for an embx. She did not take the cytotec last night.  Review of Systems     Objective:   Physical Exam    UPT negative, consent signed, time out done Cervix prepped with betadine and grasped with a single tooth tenaculum I could not pass the pipelle past 4 cm, even with changing the tenaculum to the posterior lip  She tolerated the procedure well.       Assessment & Plan:  DUB, anemia- She will take the cytotec and come back for another attempt at a embx.

## 2013-07-01 ENCOUNTER — Encounter: Payer: Self-pay | Admitting: Obstetrics & Gynecology

## 2013-07-01 ENCOUNTER — Ambulatory Visit (INDEPENDENT_AMBULATORY_CARE_PROVIDER_SITE_OTHER): Payer: Federal, State, Local not specified - PPO | Admitting: Obstetrics & Gynecology

## 2013-07-01 VITALS — BP 140/87 | HR 88 | Resp 16 | Ht 62.0 in | Wt 250.0 lb

## 2013-07-01 DIAGNOSIS — N925 Other specified irregular menstruation: Secondary | ICD-10-CM

## 2013-07-01 DIAGNOSIS — N938 Other specified abnormal uterine and vaginal bleeding: Secondary | ICD-10-CM

## 2013-07-01 DIAGNOSIS — N949 Unspecified condition associated with female genital organs and menstrual cycle: Secondary | ICD-10-CM

## 2013-07-01 DIAGNOSIS — Z01812 Encounter for preprocedural laboratory examination: Secondary | ICD-10-CM

## 2013-07-01 LAB — POCT URINE PREGNANCY: PREG TEST UR: NEGATIVE

## 2013-07-01 NOTE — Progress Notes (Signed)
   Subjective:    Patient ID: Rhonda Oneal, female    DOB: 03/23/1965, 48 y.o.   MRN: [id number]  HPI  [facility] She took cytotec last night and has spotting today.  Review of Systems     Objective:   Physical Exam  UPT negative, consent signed, time out done Cervix prepped with betadine and grasped with a single tooth tenaculum Uterus sounded to 4 cm Pipelle used for 2 passes with a small amount of tissue obtained. A gritty sensation was appreciated. She tolerated the procedure well.         Assessment & Plan:  PMB- [facility] cells [facility], then she will need a d&c.

## 2013-07-01 NOTE — Addendum Note (Signed)
Addended by: Granville LewisLARK, LORA L on: 07/01/2013 10:55 AM   Modules accepted: Orders

## 2013-07-08 ENCOUNTER — Emergency Department (INDEPENDENT_AMBULATORY_CARE_PROVIDER_SITE_OTHER)
Admission: EM | Admit: 2013-07-08 | Discharge: 2013-07-08 | Disposition: A | Discharge status: Home / Self Care | Payer: Federal, State, Local not specified - PPO | Source: Home / Self Care | Attending: Family Medicine | Admitting: Family Medicine

## 2013-07-08 ENCOUNTER — Encounter: Payer: Self-pay | Admitting: Emergency Medicine

## 2013-07-08 ENCOUNTER — Emergency Department (INDEPENDENT_AMBULATORY_CARE_PROVIDER_SITE_OTHER): Payer: Federal, State, Local not specified - PPO

## 2013-07-08 ENCOUNTER — Encounter: Payer: Self-pay | Admitting: Obstetrics & Gynecology

## 2013-07-08 ENCOUNTER — Ambulatory Visit (INDEPENDENT_AMBULATORY_CARE_PROVIDER_SITE_OTHER): Payer: Federal, State, Local not specified - PPO | Admitting: Obstetrics & Gynecology

## 2013-07-08 VITALS — BP 134/88 | HR 107 | Resp 16 | Ht 62.0 in | Wt 250.0 lb

## 2013-07-08 DIAGNOSIS — M25571 Pain in right ankle and joints of right foot: Secondary | ICD-10-CM

## 2013-07-08 DIAGNOSIS — N920 Excessive and frequent menstruation with regular cycle: Secondary | ICD-10-CM

## 2013-07-08 DIAGNOSIS — M7730 Calcaneal spur, unspecified foot: Secondary | ICD-10-CM

## 2013-07-08 DIAGNOSIS — Z3009 Encounter for other general counseling and advice on contraception: Secondary | ICD-10-CM

## 2013-07-08 DIAGNOSIS — N946 Dysmenorrhea, unspecified: Secondary | ICD-10-CM

## 2013-07-08 DIAGNOSIS — M7750 Other enthesopathy of unspecified foot: Secondary | ICD-10-CM

## 2013-07-08 DIAGNOSIS — Z0000 Encounter for general adult medical examination without abnormal findings: Secondary | ICD-10-CM

## 2013-07-08 MED ORDER — PREDNISONE 20 MG PO TABS: 20.0000 mg | ORAL_TABLET | Freq: Two times a day (BID) | ORAL | Status: DC

## 2013-07-08 MED ORDER — NORGESTREL-ETHINYL ESTRADIOL 0.3-30 MG-MCG PO TABS: 1.0000 | ORAL_TABLET | Freq: Every day | ORAL | Status: DC

## 2013-07-08 NOTE — ED Notes (Signed)
Rhonda Oneal c/o right ankle and foot pain without injury x 3 months. Pain is intermittent, wearing brace.

## 2013-07-08 NOTE — Discharge Instructions (Signed)
Obtain elastic ankle supports and wear daytime.  Ensure adequate arch supports.

## 2013-07-08 NOTE — ED Provider Notes (Signed)
CSN: [id number]     Arrival date & time 07/08/13  1156 History   First MD Initiated Contact with Patient 07/08/13 1232     Chief Complaint  Patient presents with  . Ankle Pain    right      HPI Comments: Patient complains of persistent right ankle pain for 3 to 4 months.  She has a past history of sinus tarsi syndrome and has been wearing an ankle brace without improvement.  The pain awakens her at night.  Patient is a 48 y.o. female presenting with ankle pain. The history is provided by the patient.  Ankle Pain Location:  Ankle Time since incident:  4 months Injury: no   Ankle location:  R ankle Pain details:    Quality:  Aching   Radiates to:  Does not radiate   Severity:  Moderate   Onset quality:  Gradual   Duration:  4 months   Timing:  Intermittent   Progression:  Waxing and waning Chronicity:  Chronic Prior injury to area:  No Relieved by: steroid injections. Worsened by:  Bearing weight Associated symptoms: stiffness   Associated symptoms: no back pain, no decreased ROM, no fatigue, no fever, no muscle weakness, no numbness, no swelling and no tingling   Risk factors: obesity     Past Medical History  Diagnosis Date  . Morbid obesity   . Fibromyalgia   . IBS (irritable bowel syndrome)   . Arthritis   . DERMATITIS, ATOPIC 02/09/2010    Qualifier: Diagnosis of  By: Orson AloeHenderson MD, Tinnie GensJeffrey    . EDEMA LEG 03/22/2010    Qualifier: Diagnosis of  By: Orson AloeHenderson MD, Tinnie GensJeffrey    . Asthma    Past Surgical History  Procedure Laterality Date  . Cesarean section    . Tonsillectomy and adenoidectomy    . Wisdom tooth extraction     Family History  Problem Relation Age of Onset  . Diabetes Mother   . Cancer Mother     Lung  . Breast cancer Maternal Aunt   . Ovarian cancer Paternal Aunt   . Hyperlipidemia Father    History  Substance Use Topics  . Smoking status: Never Smoker   . Smokeless tobacco: Never Used  . Alcohol Use: No   OB History   Grav Para Term  Preterm Abortions TAB SAB Ect Mult Living   2 2             Review of Systems  Constitutional: Negative for fever and fatigue.  Musculoskeletal: Positive for stiffness. Negative for back pain.  All other systems reviewed and are negative.   Allergies  Ferrous sulfate  Home Medications   Prior to Admission medications   Medication Sig Start Date End Date Taking? Authorizing Provider  amitriptyline (ELAVIL) 50 MG tablet ONE HALF TAB PO QHS FOR A WEEK, THEN ONE TAB PO QHS. 04/10/13   Laren BoomSean Hommel, DO  baclofen (LIORESAL) 10 MG tablet Take 10 mg by mouth 3 (three) times daily.    Historical Provider, MD  CINNAMON PO Take 1,000 mg by mouth 2 (two) times daily.      Historical Provider, MD  diclofenac (VOLTAREN) 50 MG EC tablet TAKE ONE TABLET EVERY 8 HOURS ONLY AS NEEDED FOR PAIN, TAKE WITH SMALL SNACK.    Sean Hommel, DO  Fluticasone-Salmeterol (ADVAIR) 100-50 MCG/DOSE AEPB Inhale 1 puff into the lungs every 12 (twelve) hours.    Historical Provider, MD  gabapentin (NEURONTIN) 800 MG tablet One tab by mouth  at breakfast, midday, 2 tabs at bedtime. 11/12/12   Monica Bectonhomas J Thekkekandam, MD  Ginger, Zingiber officinalis, (GINGER ROOT PO) Take 1 tablet by mouth daily.      Historical Provider, MD  lubiprostone (AMITIZA) 8 MCG capsule Take 8 mcg by mouth daily with breakfast.      Historical Provider, MD  misoprostol (CYTOTEC) 200 MCG tablet Take 3 pills by mouth the night before biopsy. 06/10/13   Allie BossierMyra C Dove, MD  montelukast (SINGULAIR) 10 MG tablet Take 10 mg by mouth at bedtime.    Historical Provider, MD  Multiple Vitamin (MULTIVITAMIN) tablet Take 1 tablet by mouth daily.      Historical Provider, MD  norgestrel-ethinyl estradiol (LO/OVRAL,CRYSELLE) 0.3-30 MG-MCG tablet Take 1 tablet by mouth daily. 07/08/13   Allie BossierMyra C Dove, MD  omeprazole (PRILOSEC) 20 MG capsule Take 20 mg by mouth daily.    Historical Provider, MD  traMADol-acetaminophen (ULTRACET) 37.5-325 MG per tablet 1 or 2 every 4-6 hours as  needed for moderate-severe pain 11/15/12   Lajean Manesavid Massey, MD   BP 141/94  Pulse 100  Resp 16  SpO2 99%  LMP 06/28/2013 Physical Exam  Nursing note and vitals reviewed. Constitutional: She is oriented to person, place, and time. She appears well-developed and well-nourished. No distress.  Patient is obese.  HENT:  Head: Normocephalic.  Eyes: Conjunctivae are normal. Pupils are equal, round, and reactive to light.  Cardiovascular: Normal heart sounds.   Abdominal: There is no tenderness.  Musculoskeletal:       Right ankle: She exhibits normal range of motion, no swelling, no ecchymosis, no deformity, no laceration and normal pulse. Tenderness. Proximal fibula tenderness found. No posterior TFL and no head of 5th metatarsal tenderness found.       Feet:  Neurological: She is alert and oriented to person, place, and time.  Skin: Skin is warm and dry. No rash noted.    ED Course  Procedures  none     Imaging Review Dg Ankle Complete Right  07/08/2013   CLINICAL DATA:  Chronic right ankle pain for 4 months, no injury  EXAM: RIGHT ANKLE - COMPLETE 3+ VIEW  COMPARISON:  Right ankle films of 11/12/2012  FINDINGS: The ankle joint appears normal. Alignment is normal. No fracture is seen. A small plantar calcaneal degenerative spur is noted.  IMPRESSION: Negative.   Electronically Signed   By: Dwyane DeePaul  Barry M.D.   On: 07/08/2013 13:03     MDM   1. Sinus tarsi syndrome of right ankle    Begin prednisone burst. Obtain elastic ankle supports and wear daytime.  Ensure adequate arch supports. Followup with Dr. Tyson Babinskihomas Thekkekandam     Annisha Baar A Catori Panozzo, MD 07/14/13 2300

## 2013-07-08 NOTE — Progress Notes (Signed)
   Subjective:    Patient ID: Rhonda Oneal, female    DOB: 04/10/1965, 48 y.o.   MRN: [id number]  HPI  48 yo lady with h/o dysmenorrhea and menorrhagia. Her EMBX was negative.    Review of Systems     Objective:   Physical Exam        Assessment & Plan:  As above- Because it was so difficult to dilate her cervix and pass a Pipelle, I would not relish the thought of doing a Mirena or endometrial ablation. I have recommended a trial of OCPs. She agrees with this plan. Generic lo ovral prescribed.

## 2013-07-09 ENCOUNTER — Ambulatory Visit: Payer: Federal, State, Local not specified - PPO | Admitting: Sports Medicine

## 2013-07-10 ENCOUNTER — Telehealth: Payer: Self-pay | Admitting: *Deleted

## 2013-07-10 ENCOUNTER — Encounter: Payer: Self-pay | Admitting: Sports Medicine

## 2013-07-10 ENCOUNTER — Ambulatory Visit (INDEPENDENT_AMBULATORY_CARE_PROVIDER_SITE_OTHER): Payer: Federal, State, Local not specified - PPO | Admitting: Sports Medicine

## 2013-07-10 VITALS — BP 129/79 | HR 100 | Ht 62.0 in | Wt 248.0 lb

## 2013-07-10 DIAGNOSIS — M5416 Radiculopathy, lumbar region: Secondary | ICD-10-CM

## 2013-07-10 DIAGNOSIS — IMO0002 Reserved for concepts with insufficient information to code with codable children: Secondary | ICD-10-CM

## 2013-07-10 DIAGNOSIS — M7750 Other enthesopathy of unspecified foot: Secondary | ICD-10-CM

## 2013-07-10 DIAGNOSIS — M25571 Pain in right ankle and joints of right foot: Secondary | ICD-10-CM

## 2013-07-10 NOTE — Progress Notes (Signed)
  Subjective:    CC: Followup  HPI: Lumbar degenerative disc disease: Did very well after a right-sided L5-S1 epidural, she feels like she needs another, she is also starting to have some numbness and tingling over the right anterior lower leg.  Right ankle pain: Moderate, persistent, initially diagnosed as sinus tarsi syndrome, now has pain discretely over the distal lateral fibula.  Past medical history, Surgical history, Family history not pertinant except as noted below, Social history, Allergies, and medications have been entered into the medical record, reviewed, and no changes needed.   Review of Systems: No fevers, chills, night sweats, weight loss, chest pain, or shortness of breath.   Objective:    General: Well Developed, well nourished, and in no acute distress.  Neuro: Alert and oriented x3, extra-ocular muscles intact, sensation grossly intact.  HEENT: Normocephalic, atraumatic, pupils equal round reactive to light, neck supple, no masses, no lymphadenopathy, thyroid nonpalpable.  Skin: Warm and dry, no rashes. Cardiac: Regular rate and rhythm, no murmurs rubs or gallops, no lower extremity edema.  Respiratory: Clear to auscultation bilaterally. Not using accessory muscles, speaking in full sentences. Right Ankle: No visible erythema or swelling. Range of motion is full in all directions. Strength is 5/5 in all directions. Stable lateral and medial ligaments; squeeze test and kleiger test unremarkable; Talar dome nontender; No pain at base of 5th MT; No tenderness over cuboid; No tenderness over N spot or navicular prominence No tenderness over the tendons, she does have reproducible pain with palpation over the lateral talocrural joint as well as over the actual distal fibular shaft itself. No sign of peroneal tendon subluxations or tenderness to palpation Negative tarsal tunnel tinel's Able to walk 4 steps.  Impression and Recommendations:

## 2013-07-10 NOTE — Assessment & Plan Note (Signed)
Did extremely well after a right-sided L5-S1 epidural. She does tell me that her radicular symptoms or returning, she also has some numbness and tingling on the anterior lower leg in an L4 distribution. Her MRI does show multilevel disease with stenosis at both of these levels. At this point we are going to proceed with a right-sided L4-L5 and L5-S1 transforaminal epidural. Return to see me in 2 weeks to go over response.

## 2013-07-10 NOTE — Assessment & Plan Note (Signed)
Unfortunately continues to have pain that she now localizes not only to the sinus tarsi but predominantly over the distal lateral fibula with discrete tenderness to palpation. She has failed injection, orthotics, rehabilitation. We are to proceed with MRI of the right ankle.

## 2013-07-10 NOTE — Telephone Encounter (Signed)
No PA required for MRI ankle w/o contrast.. Meyer Cory, LPN

## 2013-07-15 ENCOUNTER — Inpatient Hospital Stay: Admission: RE | Admit: 2013-07-15 | Payer: Federal, State, Local not specified - PPO | Source: Ambulatory Visit

## 2013-07-15 ENCOUNTER — Other Ambulatory Visit: Payer: Federal, State, Local not specified - PPO

## 2013-07-23 ENCOUNTER — Ambulatory Visit: Payer: Federal, State, Local not specified - PPO | Admitting: Obstetrics & Gynecology

## 2013-07-23 ENCOUNTER — Ambulatory Visit
Admission: RE | Admit: 2013-07-23 | Discharge: 2013-07-23 | Disposition: A | Discharge status: Home / Self Care | Payer: Federal, State, Local not specified - PPO | Source: Ambulatory Visit | Attending: Sports Medicine | Admitting: Sports Medicine

## 2013-07-23 VITALS — BP 142/93 | HR 116

## 2013-07-23 DIAGNOSIS — M25571 Pain in right ankle and joints of right foot: Secondary | ICD-10-CM

## 2013-07-23 DIAGNOSIS — K589 Irritable bowel syndrome without diarrhea: Secondary | ICD-10-CM

## 2013-07-23 DIAGNOSIS — M5416 Radiculopathy, lumbar region: Secondary | ICD-10-CM

## 2013-07-23 DIAGNOSIS — R609 Edema, unspecified: Secondary | ICD-10-CM

## 2013-07-23 DIAGNOSIS — J301 Allergic rhinitis due to pollen: Secondary | ICD-10-CM

## 2013-07-23 DIAGNOSIS — M5412 Radiculopathy, cervical region: Secondary | ICD-10-CM

## 2013-07-23 DIAGNOSIS — L2089 Other atopic dermatitis: Secondary | ICD-10-CM

## 2013-07-23 MED ORDER — METHYLPREDNISOLONE ACETATE 40 MG/ML INJ SUSP (RADIOLOG
120.0000 mg | Freq: Once | INTRAMUSCULAR | Status: AC
  Administered 2013-07-23: 120 mg via EPIDURAL

## 2013-07-23 MED ORDER — IOHEXOL 180 MG/ML  SOLN
1.0000 mL | Freq: Once | INTRAMUSCULAR | Status: AC | PRN
  Administered 2013-07-23: 1 mL via EPIDURAL

## 2013-07-23 NOTE — Discharge Instructions (Signed)

## 2013-09-03 ENCOUNTER — Ambulatory Visit: Payer: Federal, State, Local not specified - PPO | Admitting: Sports Medicine

## 2013-09-03 DIAGNOSIS — Z0289 Encounter for other administrative examinations: Secondary | ICD-10-CM

## 2013-09-14 ENCOUNTER — Ambulatory Visit: Payer: Federal, State, Local not specified - PPO | Admitting: Sports Medicine

## 2013-09-14 ENCOUNTER — Telehealth: Payer: Self-pay

## 2013-09-14 NOTE — Telephone Encounter (Signed)
Patient request a MRI to be changed from ankle to waist down to ankle. Rhonda Cunningham,CMA

## 2013-09-14 NOTE — Telephone Encounter (Signed)
Spoke to patient gave her instructions as noted below. Viraaj Vorndran,CMA  

## 2013-09-14 NOTE — Telephone Encounter (Signed)
That is not going to be covered to get a hip and pelvis, femur, knee, tibia/fibula, and ankle MRI. I am happy to do this, but the patient should understand that her insurance company will not pay for this. Stick with the ankle MRI, I am happy to and the foot however.

## 2013-09-19 ENCOUNTER — Ambulatory Visit (HOSPITAL_BASED_OUTPATIENT_CLINIC_OR_DEPARTMENT_OTHER)
Admission: RE | Admit: 2013-09-19 | Discharge: 2013-09-19 | Disposition: A | Discharge status: Home / Self Care | Payer: Federal, State, Local not specified - PPO | Source: Ambulatory Visit | Attending: Sports Medicine | Admitting: Sports Medicine

## 2013-09-19 DIAGNOSIS — M7750 Other enthesopathy of unspecified foot: Secondary | ICD-10-CM | POA: Insufficient documentation

## 2013-09-22 ENCOUNTER — Encounter: Payer: Self-pay | Admitting: Sports Medicine

## 2013-09-22 ENCOUNTER — Ambulatory Visit (INDEPENDENT_AMBULATORY_CARE_PROVIDER_SITE_OTHER): Payer: Federal, State, Local not specified - PPO | Admitting: Sports Medicine

## 2013-09-22 VITALS — BP 141/90 | HR 111 | Ht 62.0 in | Wt 248.0 lb

## 2013-09-22 DIAGNOSIS — M25571 Pain in right ankle and joints of right foot: Secondary | ICD-10-CM

## 2013-09-22 DIAGNOSIS — M7750 Other enthesopathy of unspecified foot: Secondary | ICD-10-CM | POA: Diagnosis not present

## 2013-09-22 DIAGNOSIS — IMO0002 Reserved for concepts with insufficient information to code with codable children: Secondary | ICD-10-CM | POA: Diagnosis not present

## 2013-09-22 DIAGNOSIS — M5416 Radiculopathy, lumbar region: Secondary | ICD-10-CM

## 2013-09-22 NOTE — Assessment & Plan Note (Signed)
MRI confirms the sinus tarsi syndrome. Last injection provided approximately 8 months of relief. Repeat tibiotalar joint injection as above. Return as needed.

## 2013-09-22 NOTE — Progress Notes (Signed)
  Subjective:    CC: MRI results  HPI: Right lumbar radiculopathy: Improved after L4 and L5 selective nerve root blocks.  Right foot pain: Diagnosed clinically sinus tarsi syndrome, she had an eight-month response to intra-articular talocrural joint injection. We did obtain an MRI which confirmed sinus tarsi syndrome. She does desire repeat injection, pain is moderate, persistent, and localized to the lateral talocrural joint.  Past medical history, Surgical history, Family history not pertinant except as noted below, Social history, Allergies, and medications have been entered into the medical record, reviewed, and no changes needed.   Review of Systems: No fevers, chills, night sweats, weight loss, chest pain, or shortness of breath.   Objective:    General: Well Developed, well nourished, and in no acute distress.  Neuro: Alert and oriented x3, extra-ocular muscles intact, sensation grossly intact.  HEENT: Normocephalic, atraumatic, pupils equal round reactive to light, neck supple, no masses, no lymphadenopathy, thyroid nonpalpable.  Skin: Warm and dry, no rashes. Cardiac: Regular rate and rhythm, no murmurs rubs or gallops, no lower extremity edema.  Respiratory: Clear to auscultation bilaterally. Not using accessory muscles, speaking in full sentences.  Procedure: Real-time Ultrasound Guided Injection of right talocrural joint Device: GE Logiq E  Verbal informed consent obtained.  Time-out conducted.  Noted no overlying erythema, induration, or other signs of local infection.  Skin prepped in a sterile fashion.  Local anesthesia: Topical Ethyl chloride.  With sterile technique and under real time ultrasound guidance: 25-gauge needle advanced over the talus, into the joint, 1 cc kenalog 40, 3 cc lidocaine injected easily.  Completed without difficulty  Pain immediately resolved suggesting accurate placement of the medication.  Advised to call if fevers/chills, erythema,  induration, drainage, or persistent bleeding.  Images permanently stored and available for review in the ultrasound unit.  Impression: Technically successful ultrasound guided injection.  Impression and Recommendations:

## 2013-09-22 NOTE — Assessment & Plan Note (Signed)
Good response right-sided L4-L5 and L5-S1 transforaminal epidurals.

## 2013-09-25 ENCOUNTER — Telehealth: Payer: Self-pay

## 2013-09-25 DIAGNOSIS — M5416 Radiculopathy, lumbar region: Secondary | ICD-10-CM

## 2013-09-25 NOTE — Telephone Encounter (Signed)
Epidurals ordered.  

## 2013-09-25 NOTE — Telephone Encounter (Signed)
Patient called stated that [facility] Rhonda Cunningham,CMA

## 2013-09-25 NOTE — Telephone Encounter (Signed)
[facility] and let her know to call patient and schedule for epidural injection. Elhadj Girton,CMA

## 2013-10-05 ENCOUNTER — Other Ambulatory Visit: Payer: Self-pay | Admitting: Sports Medicine

## 2013-10-05 ENCOUNTER — Ambulatory Visit
Admission: RE | Admit: 2013-10-05 | Discharge: 2013-10-05 | Disposition: A | Discharge status: Home / Self Care | Payer: Federal, State, Local not specified - PPO | Source: Ambulatory Visit | Attending: Sports Medicine | Admitting: Sports Medicine

## 2013-10-05 DIAGNOSIS — M5416 Radiculopathy, lumbar region: Secondary | ICD-10-CM

## 2013-10-05 MED ORDER — IOHEXOL 180 MG/ML  SOLN
1.0000 mL | Freq: Once | INTRAMUSCULAR | Status: AC | PRN
  Administered 2013-10-05: 1 mL via EPIDURAL

## 2013-10-05 MED ORDER — METHYLPREDNISOLONE ACETATE 40 MG/ML INJ SUSP (RADIOLOG
120.0000 mg | Freq: Once | INTRAMUSCULAR | Status: AC
  Administered 2013-10-05: 120 mg via EPIDURAL

## 2013-10-05 NOTE — Discharge Instructions (Signed)

## 2013-12-03 ENCOUNTER — Encounter: Payer: Self-pay | Admitting: Family Medicine

## 2013-12-04 ENCOUNTER — Other Ambulatory Visit: Payer: Self-pay | Admitting: *Deleted

## 2013-12-04 MED ORDER — DICLOFENAC SODIUM 50 MG PO TBEC: DELAYED_RELEASE_TABLET | ORAL | Status: DC

## 2013-12-21 ENCOUNTER — Encounter: Payer: Self-pay | Admitting: Sports Medicine

## 2014-03-12 ENCOUNTER — Other Ambulatory Visit: Payer: Self-pay | Admitting: Family Medicine

## 2014-07-09 ENCOUNTER — Other Ambulatory Visit: Payer: Self-pay | Admitting: Obstetrics & Gynecology

## 2014-08-12 ENCOUNTER — Other Ambulatory Visit: Payer: Self-pay | Admitting: Obstetrics & Gynecology

## 2014-08-24 ENCOUNTER — Ambulatory Visit: Payer: Federal, State, Local not specified - PPO | Admitting: Obstetrics & Gynecology

## 2014-08-24 DIAGNOSIS — Z124 Encounter for screening for malignant neoplasm of cervix: Secondary | ICD-10-CM

## 2014-09-02 ENCOUNTER — Ambulatory Visit: Payer: Federal, State, Local not specified - PPO | Admitting: Obstetrics & Gynecology

## 2014-09-06 ENCOUNTER — Ambulatory Visit: Payer: Federal, State, Local not specified - PPO | Admitting: Obstetrics & Gynecology

## 2014-09-13 ENCOUNTER — Ambulatory Visit: Payer: Federal, State, Local not specified - PPO | Admitting: Obstetrics & Gynecology

## 2014-09-24 ENCOUNTER — Ambulatory Visit (INDEPENDENT_AMBULATORY_CARE_PROVIDER_SITE_OTHER): Payer: Federal, State, Local not specified - PPO | Admitting: Family Medicine

## 2014-09-24 ENCOUNTER — Encounter: Payer: Self-pay | Admitting: Family Medicine

## 2014-09-24 VITALS — BP 161/100 | HR 101 | Temp 98.2°F | Wt 256.0 lb

## 2014-09-24 DIAGNOSIS — H6691 Otitis media, unspecified, right ear: Secondary | ICD-10-CM | POA: Diagnosis not present

## 2014-09-24 MED ORDER — CEFDINIR 300 MG PO CAPS: 300.0000 mg | ORAL_CAPSULE | Freq: Two times a day (BID) | ORAL | Status: DC

## 2014-09-24 NOTE — Progress Notes (Signed)
CC: Rhonda Oneal is a 49 y.o. female is here for right ear pain   Subjective: HPI:  Right ear pain described as fullness, mild in severity, present for the last 3 days. Nothing seems to make it better or worse other than worse with swallowing. A complete by facial pressure in the cheeks. Pain seems to radiate into the right cheek and down the right neck. Has felt feverish but temperature always registers as normal. Denies hearing loss or tinnitus. No headache. No rash. No difficulty swallowing.   Review Of Systems Outlined In HPI  Past Medical History  Diagnosis Date  . Morbid obesity   . Fibromyalgia   . IBS (irritable bowel syndrome)   . Arthritis   . DERMATITIS, ATOPIC 02/09/2010    Qualifier: Diagnosis of  By: Orson Aloe MD, Tinnie Gens    . EDEMA LEG 03/22/2010    Qualifier: Diagnosis of  By: Orson Aloe MD, Tinnie Gens    . Asthma     Past Surgical History  Procedure Laterality Date  . Cesarean section    . Tonsillectomy and adenoidectomy    . Wisdom tooth extraction     Family History  Problem Relation Age of Onset  . Diabetes Mother   . Cancer Mother     Lung  . Breast cancer Maternal Aunt   . Ovarian cancer Paternal Aunt   . Hyperlipidemia Father     History   Social History  . Marital Status: Married    Spouse Name: N/A  . Number of Children: N/A  . Years of Education: N/A   Occupational History  . Not on file.   Social History Main Topics  . Smoking status: Never Smoker   . Smokeless tobacco: Never Used  . Alcohol Use: No  . Drug Use: No  . Sexual Activity:    Partners: Male    Birth Control/ Protection: Surgical   Other Topics Concern  . Not on file   Social History Narrative     Objective: BP 161/100 mmHg  Pulse 101  Temp(Src) 98.2 F (36.8 C) (Oral)  Wt 256 lb (116.121 kg)  General: Alert and Oriented, No Acute Distress HEENT: Pupils equal, round, reactive to light. Conjunctivae clear.  External ears unremarkable, canals clear with intact  TMs with appropriate landmarks.  Left Middle ear appears open without effusion, right middle ear with an opaque effusion. Pink inferior turbinates.  Moist mucous membranes, pharynx without inflammation nor lesions.  Neck supple without palpable lymphadenopathy nor abnormal masses. Lungs: Clear comfortable work of breathing Cardiac: Regular rate and rhythm.  Extremities: No peripheral edema.  Strong peripheral pulses.  Mental Status: No depression, anxiety, nor agitation. Skin: Warm and dry.  Assessment & Plan: Melaysia was seen today for right ear pain.  Diagnoses and all orders for this visit:  Right otitis media, recurrence not specified, unspecified chronicity, unspecified otitis media type Orders: -     cefdinir (OMNICEF) 300 MG capsule; Take 1 capsule (300 mg total) by mouth 2 (two) times daily.   Start Omnicef for right otitis media, avoid decongestants, call on Monday if no better, prednisone would be next step   Return if symptoms worsen or fail to improve.

## 2014-09-27 ENCOUNTER — Telehealth: Payer: Self-pay | Admitting: *Deleted

## 2014-09-27 MED ORDER — AMOXICILLIN-POT CLAVULANATE 500-125 MG PO TABS: ORAL_TABLET | ORAL | Status: AC

## 2014-09-27 NOTE — Telephone Encounter (Signed)
Amber, Augmentin has been sent as a substitute

## 2014-09-27 NOTE — Telephone Encounter (Signed)
Pt left vm stating that the abx you gave her Friday is "too strong". She wanted to know if you could send her something else in the to the [facility] on [address].

## 2014-09-27 NOTE — Telephone Encounter (Signed)
LMOM notifying pt of new rx sent to her pharmacy.

## 2014-09-30 ENCOUNTER — Encounter: Payer: Self-pay | Admitting: Family Medicine

## 2014-10-01 ENCOUNTER — Telehealth: Payer: Self-pay | Admitting: Family Medicine

## 2014-10-01 MED ORDER — DEXLANSOPRAZOLE 60 MG PO CPDR: 60.0000 mg | DELAYED_RELEASE_CAPSULE | Freq: Every day | ORAL | Status: DC

## 2014-10-01 NOTE — Telephone Encounter (Signed)
Rhonda Oneal, Will you please let patient know that I sent her dexilant request to her pharmacy, I accidentally closed out her MyChart message.

## 2014-10-01 NOTE — Telephone Encounter (Signed)
Pt.notified

## 2014-10-02 ENCOUNTER — Encounter: Payer: Self-pay | Admitting: *Deleted

## 2014-10-02 ENCOUNTER — Emergency Department (INDEPENDENT_AMBULATORY_CARE_PROVIDER_SITE_OTHER)
Admission: EM | Admit: 2014-10-02 | Discharge: 2014-10-02 | Disposition: A | Discharge status: Home / Self Care | Payer: Federal, State, Local not specified - PPO | Source: Home / Self Care | Attending: Family Medicine | Admitting: Family Medicine

## 2014-10-02 DIAGNOSIS — J069 Acute upper respiratory infection, unspecified: Secondary | ICD-10-CM

## 2014-10-02 DIAGNOSIS — R053 Chronic cough: Secondary | ICD-10-CM

## 2014-10-02 DIAGNOSIS — R05 Cough: Secondary | ICD-10-CM | POA: Diagnosis not present

## 2014-10-02 MED ORDER — DM-GUAIFENESIN ER 30-600 MG PO TB12: 1.0000 | ORAL_TABLET | Freq: Two times a day (BID) | ORAL | Status: DC

## 2014-10-02 MED ORDER — OXYMETAZOLINE HCL 0.05 % NA SOLN: 1.0000 | Freq: Two times a day (BID) | NASAL | Status: DC

## 2014-10-02 MED ORDER — BENZONATATE 100 MG PO CAPS: 100.0000 mg | ORAL_CAPSULE | Freq: Three times a day (TID) | ORAL | Status: DC

## 2014-10-02 NOTE — ED Notes (Signed)
Pt c/o sinus infection and cough x 2 weeks. On amoxicillin for 3 days without any improvement. Afebrile.

## 2014-10-02 NOTE — ED Provider Notes (Signed)
CSN: [id number]     Arrival date & time 10/02/14  1623 History   First MD Initiated Contact with Patient 10/02/14 1644     Chief Complaint  Patient presents with  . URI   (Consider location/radiation/quality/duration/timing/severity/associated sxs/prior Treatment) HPI  Pt is a 49yo female presenting to [facility] with c/o persistent cough for 2 weeks. Pt was initially seen by Dr. Ivan Anchors on 09/24/14, dx with Right AOM and started on Omnicef.  Pt then called office as medication was "too strong" she was switched to Augmentin and has been taking for 3-4 days but states she has not had any improvement in cough. Pt states it feels like mucous keeps getting stuck in her throat as she has to keep clearing her throat. She did try Alkaseltzer capsule w/o relief. Cough is moderate, intermittent, keeps her up at night.  Associated body aches and nausea. She also has continued nasal congestion and pressure. Denies fever or chills. Denies sick contacts or recent travel. Denies CP or difficulty breathing. Pt does have hx of acid reflux and takes Prilosec daily.   Past Medical History  Diagnosis Date  . Morbid obesity   . Fibromyalgia   . IBS (irritable bowel syndrome)   . Arthritis   . DERMATITIS, ATOPIC 02/09/2010    Qualifier: Diagnosis of  By: Orson Aloe MD, Tinnie Gens    . EDEMA LEG 03/22/2010    Qualifier: Diagnosis of  By: Orson Aloe MD, Tinnie Gens    . Asthma    Past Surgical History  Procedure Laterality Date  . Cesarean section    . Tonsillectomy and adenoidectomy    . Wisdom tooth extraction     Family History  Problem Relation Age of Onset  . Diabetes Mother   . Cancer Mother     Lung  . Breast cancer Maternal Aunt   . Ovarian cancer Paternal Aunt   . Hyperlipidemia Father    Social History  Substance Use Topics  . Smoking status: Never Smoker   . Smokeless tobacco: Never Used  . Alcohol Use: No   OB History    Gravida Para Term Preterm AB TAB SAB Ectopic Multiple Living   2 2              Review of Systems  Constitutional: Negative for fever and chills.  HENT: Positive for congestion, postnasal drip, rhinorrhea, sinus pressure and sore throat.   Eyes: Negative for photophobia, pain, redness and visual disturbance.  Respiratory: Positive for cough.   Cardiovascular: Negative for chest pain and palpitations.  Gastrointestinal: Positive for nausea. Negative for vomiting, abdominal pain and diarrhea.  Musculoskeletal: Positive for myalgias and arthralgias.  Neurological: Positive for headaches. Negative for dizziness and light-headedness.    Allergies  Ferrous sulfate  Home Medications   Prior to Admission medications   Medication Sig Start Date End Date Taking? Authorizing Provider  amitriptyline (ELAVIL) 50 MG tablet ONE HALF TAB PO QHS FOR A WEEK, THEN ONE TAB PO QHS. 04/10/13   Laren Boom, DO  amoxicillin-clavulanate (AUGMENTIN) 500-125 MG per tablet Take one by mouth every 8 hours for ten total days. 09/27/14 10/07/14  Laren Boom, DO  baclofen (LIORESAL) 10 MG tablet Take 10 mg by mouth 3 (three) times daily.    Historical Provider, MD  benzonatate (TESSALON) 100 MG capsule Take 1 capsule (100 mg total) by mouth every 8 (eight) hours. 10/02/14   Junius Finner, PA-C  CINNAMON PO Take 1,000 mg by mouth 2 (two) times daily.  Historical Provider, MD  CRYSELLE-28 0.3-30 MG-MCG tablet TAKE 1 TABLET BY MOUTH DAILY. 07/22/14   Allie Bossier, MD  dexlansoprazole (DEXILANT) 60 MG capsule Take 1 capsule (60 mg total) by mouth daily. 10/01/14   Sean Hommel, DO  dextromethorphan-guaiFENesin (MUCINEX DM) 30-600 MG per 12 hr tablet Take 1 tablet by mouth 2 (two) times daily. 10/02/14   Junius Finner, PA-C  diclofenac (VOLTAREN) 50 MG EC tablet TAKE ONE TABLET EVERY 8 HOURS ONLY AS NEEDED FOR PAIN, TAKE WITH SMALL SNACK. 12/04/13   Sean Hommel, DO  Fluticasone-Salmeterol (ADVAIR) 100-50 MCG/DOSE AEPB Inhale 1 puff into the lungs every 12 (twelve) hours.    Historical Provider, MD   gabapentin (NEURONTIN) 800 MG tablet One tab by mouth at breakfast, midday, 2 tabs at bedtime. 11/12/12   Monica Becton, MD  Ginger, Zingiber officinalis, (GINGER ROOT PO) Take 1 tablet by mouth daily.      Historical Provider, MD  lubiprostone (AMITIZA) 8 MCG capsule Take 8 mcg by mouth daily with breakfast.      Historical Provider, MD  montelukast (SINGULAIR) 10 MG tablet Take 10 mg by mouth at bedtime.    Historical Provider, MD  Multiple Vitamin (MULTIVITAMIN) tablet Take 1 tablet by mouth daily.      Historical Provider, MD  omeprazole (PRILOSEC) 20 MG capsule Take 20 mg by mouth daily.    Historical Provider, MD  oxymetazoline (AFRIN NASAL SPRAY) 0.05 % nasal spray Place 1 spray into both nostrils 2 (two) times daily. Do not exceed 4 consecutive days 10/02/14   Junius Finner, PA-C  traMADol-acetaminophen (ULTRACET) 37.5-325 MG per tablet 1 or 2 every 4-6 hours as needed for moderate-severe pain 11/15/12   Lajean Manes, MD   BP 158/91 mmHg  Pulse 98  Temp(Src) 98.5 F (36.9 C) (Oral)  Resp 18  Wt 251 lb (113.853 kg)  SpO2 100%  LMP 09/27/2014 Physical Exam  Constitutional: She appears well-developed and well-nourished. No distress.  HENT:  Head: Normocephalic and atraumatic.  Right Ear: Hearing, tympanic membrane, external ear and ear canal normal.  Left Ear: Hearing, tympanic membrane, external ear and ear canal normal.  Nose: Mucosal edema present. Right sinus exhibits maxillary sinus tenderness. Right sinus exhibits no frontal sinus tenderness. Left sinus exhibits maxillary sinus tenderness. Left sinus exhibits no frontal sinus tenderness.  Mouth/Throat: Uvula is midline and mucous membranes are normal. Posterior oropharyngeal erythema present. No oropharyngeal exudate, posterior oropharyngeal edema or tonsillar abscesses.  Eyes: Conjunctivae are normal. No scleral icterus.  Neck: Normal range of motion. Neck supple.  Cardiovascular: Normal rate, regular rhythm and normal  heart sounds.   Tachycardic at triage but not on exam  Pulmonary/Chest: Effort normal and breath sounds normal. No respiratory distress. She has no wheezes. She has no rales. She exhibits no tenderness.  Intermittent productive cough.  Clearing throat throughout exam.  Lungs: CTAB. No respiratory distress.  Abdominal: Soft. She exhibits no distension. There is no tenderness.  Musculoskeletal: Normal range of motion.  Neurological: She is alert.  Skin: Skin is warm and dry. She is not diaphoretic.  Nursing note and vitals reviewed.   ED Course  Procedures (including critical care time) Labs Review Labs Reviewed - No data to display  Imaging Review No results found.   MDM   1. Acute upper respiratory infection   2. Persistent cough     Pt presenting to [facility] for further evaluation of persistent URI symptoms despite being on Augmentin for 3-4 days.  Pt does  clear her throat several times during exam but no stridor. No respiratory distress. No evidence of tonsillar abscess.  Will add medication to help with pt's symptoms. Encouraged to keep taking augmentin until she completes the antibiotic course. Rx: Tessalon, Mucinex DM, and Afrin for 3-4 days. F/u with Dr. Ivan Anchors if not improving by the end of the week. Patient verbalized understanding and agreement with treatment plan.    Junius Finner, PA-C 10/02/14 1724

## 2014-10-02 NOTE — Discharge Instructions (Signed)
Please continue to take antibiotics as prescribed.  Please call to schedule follow up appointment with Dr. Ivan Anchors if not improving as he may need to change your medications.   Cool Mist Vaporizers Vaporizers may help relieve the symptoms of a cough and cold. They add moisture to the air, which helps mucus to become thinner and less sticky. This makes it easier to breathe and cough up secretions. Cool mist vaporizers do not cause serious burns like hot mist vaporizers, which may also be called steamers or humidifiers. Vaporizers have not been proven to help with colds. You should not use a vaporizer if you are allergic to mold. HOME CARE INSTRUCTIONS  Follow the package instructions for the vaporizer.  Do not use anything other than distilled water in the vaporizer.  Do not run the vaporizer all of the time. This can cause mold or bacteria to grow in the vaporizer.  Clean the vaporizer after each time it is used.  Clean and dry the vaporizer well before storing it.  Stop using the vaporizer if worsening respiratory symptoms develop. Document Released: 11/03/2003 Document Revised: 02/10/2013 Document Reviewed: 06/25/2012 [facility] Patient Information 2015 [place], [place]. This information is not intended to replace advice given to you by your health care provider. Make sure you discuss any questions you have with your health care provider.

## 2014-10-04 ENCOUNTER — Telehealth: Payer: Self-pay

## 2014-10-04 NOTE — Telephone Encounter (Signed)
FYI Patient is not feeling better. She scheduled an appointment with ENT for tomorrow morning.

## 2014-10-21 ENCOUNTER — Ambulatory Visit (INDEPENDENT_AMBULATORY_CARE_PROVIDER_SITE_OTHER): Payer: Federal, State, Local not specified - PPO | Admitting: Family Medicine

## 2014-10-21 ENCOUNTER — Encounter: Payer: Self-pay | Admitting: Family Medicine

## 2014-10-21 VITALS — BP 141/90 | HR 118 | Wt 252.0 lb

## 2014-10-21 DIAGNOSIS — M7712 Lateral epicondylitis, left elbow: Secondary | ICD-10-CM | POA: Diagnosis not present

## 2014-10-21 DIAGNOSIS — G43809 Other migraine, not intractable, without status migrainosus: Secondary | ICD-10-CM | POA: Diagnosis not present

## 2014-10-21 MED ORDER — KETOROLAC TROMETHAMINE 60 MG/2ML IM SOLN
60.0000 mg | Freq: Once | INTRAMUSCULAR | Status: AC
  Administered 2014-10-21: 60 mg via INTRAMUSCULAR

## 2014-10-21 MED ORDER — METHYLPREDNISOLONE ACETATE 80 MG/ML IJ SUSP
80.0000 mg | Freq: Once | INTRAMUSCULAR | Status: AC
  Administered 2014-10-21: 80 mg via INTRAMUSCULAR

## 2014-10-21 NOTE — Addendum Note (Signed)
Addended by: Avon Gully C on: 10/21/2014 12:15 PM   Modules accepted: Orders

## 2014-10-21 NOTE — Progress Notes (Signed)
CC: Rhonda Oneal is a 49 y.o. female is here for migraines   Subjective: HPI:  Left elbow pain present for the last week. Is localizing the lateral surface of the elbow. It's worse with any extension of the elbow or extension of the wrist. It is described as sharp, nonradiating. Mild in severity. Present all hours of the day but not interfering with sleep. No intervention at. She denies any swelling redness or warmth at the site of discomfort nor any recent or remote trauma.  Complains of right-sided headache localized in the temple, pounding, unilateral, nonradiating, fluctuates throughout the day almost absent first thing in the morning and worse as the day goes on. There is no positional component to her headache. She denies any nausea but has had phonophobia and photophobia. Slightly improved with over-the-counter nondistorted anti-inflammatory. Nothing else seems to make it better or worse. She denies worst headache of her life, awakening because of the headache or any motor or sensory disturbances other than the pain in her left arm. Denies vision loss, fevers, chills, nasal congestion, hearing loss, confusion, headache elsewhere. Denies rash or weakness nor sensory disturbances.   Review Of Systems Outlined In HPI  Past Medical History  Diagnosis Date  . Morbid obesity   . Fibromyalgia   . IBS (irritable bowel syndrome)   . Arthritis   . DERMATITIS, ATOPIC 02/09/2010    Qualifier: Diagnosis of  By: Orson Aloe MD, Tinnie Gens    . EDEMA LEG 03/22/2010    Qualifier: Diagnosis of  By: Orson Aloe MD, Tinnie Gens    . Asthma     Past Surgical History  Procedure Laterality Date  . Cesarean section    . Tonsillectomy and adenoidectomy    . Wisdom tooth extraction     Family History  Problem Relation Age of Onset  . Diabetes Mother   . Cancer Mother     Lung  . Breast cancer Maternal Aunt   . Ovarian cancer Paternal Aunt   . Hyperlipidemia Father     Social History   Social History   . Marital Status: Married    Spouse Name: N/A  . Number of Children: N/A  . Years of Education: N/A   Occupational History  . Not on file.   Social History Main Topics  . Smoking status: Never Smoker   . Smokeless tobacco: Never Used  . Alcohol Use: No  . Drug Use: No  . Sexual Activity:    Partners: Male    Birth Control/ Protection: Surgical   Other Topics Concern  . Not on file   Social History Narrative     Objective: BP 141/90 mmHg  Pulse 118  Wt 252 lb (114.306 kg)  LMP 09/27/2014   General: Alert and Oriented, No Acute Distress HEENT: Pupils equal, round, reactive to light. Conjunctivae clear.  External ears unremarkable, canals clear with intact TMs with appropriate landmarks.  Middle ear appears open without effusion. Pink inferior turbinates.  Moist mucous membranes, pharynx without inflammation nor lesions.  Neck supple without palpable lymphadenopathy nor abnormal masses. Neuro: CN II-XII grossly intact, full strength/rom of all four extremities, C5/L4/S1 DTRs 2/4 bilaterally, gait normal, rapid alternating movements normal, Lungs: Clear and comfortable work of breathing Cardiac: Regular rate and rhythm.  Extremities: No peripheral edema.  Strong peripheral pulses. Pain is localized to the lateral aspect of the left elbow, pain reproduced with pressing on the lateral epicondyle. Pain reproduced with resisted wrist extension when elbow in full extension. No overlying skin changes  Mental Status: No depression, anxiety, nor agitation. Skin: Warm and dry.  Assessment & Plan: Carely was seen today for migraines.  Diagnoses and all orders for this visit:  Left tennis elbow  Other migraine without status migrainosus, not intractable   Left tennis elbow: Provided with home rehabilitation plans and stretching to do on a daily basis for the next 2-3 weeks. Headache: Migrainous features therefore treating with Toradol and Depo-Medrol, avoiding promethazine since  she's not having any nausea and will be driving.Signs and symptoms requring emergent/urgent reevaluation were discussed with the patient.  No Follow-up on file.

## 2014-10-29 ENCOUNTER — Telehealth: Payer: Self-pay | Admitting: *Deleted

## 2014-10-29 NOTE — Telephone Encounter (Signed)
Pt states her HA's are coming back again and wants medications  for migraines

## 2014-11-01 MED ORDER — SUMATRIPTAN SUCCINATE 50 MG PO TABS: 50.0000 mg | ORAL_TABLET | ORAL | Status: DC | PRN

## 2014-11-01 NOTE — Telephone Encounter (Signed)
Pt.notified

## 2014-11-01 NOTE — Telephone Encounter (Signed)
Rhonda Oneal, Rx placed in in-box ready for pickup/faxing. Unsure of which pharmacy she wants to use.

## 2014-11-23 ENCOUNTER — Telehealth: Payer: Self-pay | Admitting: *Deleted

## 2014-11-23 ENCOUNTER — Encounter: Payer: Self-pay | Admitting: Family Medicine

## 2014-11-23 ENCOUNTER — Ambulatory Visit (INDEPENDENT_AMBULATORY_CARE_PROVIDER_SITE_OTHER): Payer: Federal, State, Local not specified - PPO | Admitting: Family Medicine

## 2014-11-23 VITALS — BP 132/96 | HR 98 | Wt 252.0 lb

## 2014-11-23 DIAGNOSIS — J340 Abscess, furuncle and carbuncle of nose: Secondary | ICD-10-CM | POA: Diagnosis not present

## 2014-11-23 MED ORDER — LIDOCAINE 5 % EX OINT: 1.0000 "application " | TOPICAL_OINTMENT | Freq: Three times a day (TID) | CUTANEOUS | Status: DC | PRN

## 2014-11-23 MED ORDER — DOXYCYCLINE HYCLATE 100 MG PO TABS: ORAL_TABLET | ORAL | Status: DC

## 2014-11-23 NOTE — Progress Notes (Signed)
CC: Rhonda Oneal is a 49 y.o. female is here for right side of nose hurting and Hashimoto's Thyroiditis   Subjective: HPI:  Right-sided nasal pain that has been present for the past 2 days. It's tender to the touch. Nothing else makes it better or worse. No interventions as of yet. She's had some mucus in the back of her throat but denies any other complaints. Denies recent trauma or overexertion. Denies any nasal drainage or bleeding. She's never had this before. Her daughter was recently hospitalized for MRSA. Denies nausea, nor any skin complaints   Review Of Systems Outlined In HPI  Past Medical History  Diagnosis Date  . Morbid obesity (HCC)   . Fibromyalgia   . IBS (irritable bowel syndrome)   . Arthritis   . DERMATITIS, ATOPIC 02/09/2010    Qualifier: Diagnosis of  By: Orson Aloe MD, Tinnie Gens    . EDEMA LEG 03/22/2010    Qualifier: Diagnosis of  By: Orson Aloe MD, Tinnie Gens    . Asthma     Past Surgical History  Procedure Laterality Date  . Cesarean section    . Tonsillectomy and adenoidectomy    . Wisdom tooth extraction     Family History  Problem Relation Age of Onset  . Diabetes Mother   . Cancer Mother     Lung  . Breast cancer Maternal Aunt   . Ovarian cancer Paternal Aunt   . Hyperlipidemia Father     Social History   Social History  . Marital Status: Married    Spouse Name: N/A  . Number of Children: N/A  . Years of Education: N/A   Occupational History  . Not on file.   Social History Main Topics  . Smoking status: Never Smoker   . Smokeless tobacco: Never Used  . Alcohol Use: No  . Drug Use: No  . Sexual Activity:    Partners: Male    Birth Control/ Protection: Surgical   Other Topics Concern  . Not on file   Social History Narrative     Objective: BP 132/96 mmHg  Pulse 98  Wt 252 lb (114.306 kg)  General: Alert and Oriented, No Acute Distress HEENT: Pupils equal, round, reactive to light. Conjunctivae clear.  External ears  unremarkable, canals clear with intact TMs with appropriate landmarks.  Middle ear appears open without effusion. Pink inferior turbinates.  Moist mucous membranes, pharynx without inflammation nor lesions.  Neck supple without palpable lymphadenopathy nor abnormal masses. Lungs: Moist mucous membranes  Cardiac: Regular rate and rhythm.  Extremities: No peripheral edema.  Strong peripheral pulses.  Mental Status: No depression, anxiety, nor agitation. Skin: Warm and dry.  Assessment & Plan: Rhonda Oneal was seen today for right side of nose hurting and hashimoto's thyroiditis.  Diagnoses and all orders for this visit:  Nose cellulitis -     doxycycline (VIBRA-TABS) 100 MG tablet; One by mouth twice a day for ten days.  Other orders -     lidocaine (XYLOCAINE) 5 % ointment; Apply 1 application topically 3 (three) times daily as needed.   No cellulitis therefore start doxycycline, if no better after 2 days please call and I will add Bactroban. To help with pain she can try lidocaine ointment.Signs and symptoms requring emergent/urgent reevaluation were discussed with the patient.  Return if symptoms worsen or fail to improve.

## 2014-11-23 NOTE — Telephone Encounter (Signed)
Pt states the lidocaine that was rx'ed earlier is very expensive. She wants to know if there is anything else that can be rx'd

## 2014-11-24 NOTE — Telephone Encounter (Signed)
Left message on vm

## 2014-11-24 NOTE — Telephone Encounter (Signed)
There is not any alternative that I'm aware of.  This was Rxed for comfort, as long as she is able to afford the antibiotic Rxed that is the most important intervention.  It's not 100% necessary to get the lidocaine if it's not affordable.

## 2014-11-26 ENCOUNTER — Telehealth: Payer: Self-pay

## 2014-11-26 MED ORDER — FLUCONAZOLE 150 MG PO TABS: 150.0000 mg | ORAL_TABLET | Freq: Once | ORAL | Status: DC

## 2014-11-26 NOTE — Telephone Encounter (Signed)
Patient has been on antibiotics and now need yeast infection medication. Please advise. Dr Ivan Anchors patient.

## 2014-11-26 NOTE — Telephone Encounter (Signed)
Diflucan 150 by mouth 1 prescribed.

## 2014-11-30 NOTE — Telephone Encounter (Signed)
Patient advised.

## 2014-12-02 ENCOUNTER — Encounter: Payer: Self-pay | Admitting: Obstetrics & Gynecology

## 2014-12-02 ENCOUNTER — Ambulatory Visit (INDEPENDENT_AMBULATORY_CARE_PROVIDER_SITE_OTHER): Payer: Federal, State, Local not specified - PPO | Admitting: Obstetrics & Gynecology

## 2014-12-02 VITALS — BP 138/93 | HR 85 | Resp 16 | Ht 62.0 in | Wt 247.0 lb

## 2014-12-02 DIAGNOSIS — Z0000 Encounter for general adult medical examination without abnormal findings: Secondary | ICD-10-CM

## 2014-12-02 DIAGNOSIS — Z01419 Encounter for gynecological examination (general) (routine) without abnormal findings: Secondary | ICD-10-CM

## 2014-12-02 DIAGNOSIS — Z1151 Encounter for screening for human papillomavirus (HPV): Secondary | ICD-10-CM

## 2014-12-02 DIAGNOSIS — Z124 Encounter for screening for malignant neoplasm of cervix: Secondary | ICD-10-CM | POA: Diagnosis not present

## 2014-12-02 DIAGNOSIS — Z23 Encounter for immunization: Secondary | ICD-10-CM | POA: Diagnosis not present

## 2014-12-02 DIAGNOSIS — L292 Pruritus vulvae: Secondary | ICD-10-CM

## 2014-12-02 MED ORDER — NORGESTREL-ETHINYL ESTRADIOL 0.3-30 MG-MCG PO TABS: 1.0000 | ORAL_TABLET | Freq: Every day | ORAL | Status: DC

## 2014-12-02 NOTE — Progress Notes (Signed)
Subjective:    Rhonda Oneal is a 49 y.o. M AAP2 ( 8 and 917 yo daughters)  female who presents for an annual exam. The patient has no complaints today. She is having vulvar itching for about 2 weeks. She thinks that she might have left a tampon in and would like me to check. The patient is sexually active. GYN screening history: last pap: was normal. The patient wears seatbelts: yes. The patient participates in regular exercise: no. Has the patient ever been transfused or tattooed?: no. The patient reports that there is not domestic violence in her life.   Menstrual History: OB History    Gravida Para Term Preterm AB TAB SAB Ectopic Multiple Living   2 2              Menarche age: [age]  Patient's last menstrual period was 11/17/2014.    The following portions of the patient's history were reviewed and updated as appropriate: allergies, current medications, past family history, past medical history, past social history, past surgical history and problem list.  Review of Systems Pertinent items noted in HPI and remainder of comprehensive ROS otherwise negative.  Ms Hollice EspyGibson, NP is her primary care. Her husband has had a vasectomy. Works at home. Denies dyspareunia.    Objective:    BP 138/93 mmHg  Pulse 85  Resp 16  Ht 5\' 2"  (1.575 m)  Wt 247 lb (112.038 kg)  BMI 45.17 kg/m2  LMP 11/17/2014  General Appearance:    Alert, cooperative, no distress, appears stated age  Head:    Normocephalic, without obvious abnormality, atraumatic  Eyes:    PERRL, conjunctiva/corneas clear, EOM's intact, fundi    benign, both eyes  Ears:    Normal TM's and external ear canals, both ears  Nose:   Nares normal, septum midline, mucosa normal, no drainage    or sinus tenderness  Throat:   Lips, mucosa, and tongue normal; teeth and gums normal  Neck:   Supple, symmetrical, trachea midline, no adenopathy;    thyroid:  no enlargement/tenderness/nodules; no carotid   bruit or JVD  Back:     Symmetric, no  curvature, ROM normal, no CVA tenderness  Lungs:     Clear to auscultation bilaterally, respirations unlabored  Chest Wall:    No tenderness or deformity   Heart:    Regular rate and rhythm, S1 and S2 normal, no murmur, rub   or gallop  Breast Exam:    No tenderness, masses, or nipple abnormality  Abdomen:     Soft, non-tender, bowel sounds active all four quadrants,    no masses, no organomegaly  Genitalia:    Normal female without lesion, discharge or tenderness,10 week size, NT, mobile, no palpable adnexal masses     Extremities:   Extremities normal, atraumatic, no cyanosis or edema  Pulses:   2+ and symmetric all extremities  Skin:   Skin color, texture, turgor normal, no rashes or lesions  Lymph nodes:   Cervical, supraclavicular, and axillary nodes normal  Neurologic:   CNII-XII intact, normal strength, sensation and reflexes    throughout  .    Assessment:    Healthy female exam.    Plan:     Breast self exam technique reviewed and patient encouraged to perform self-exam monthly. Mammogram. Thin prep Pap smear.   Flu vaccine today Refill OCPs (for dysmenorrhea)

## 2014-12-03 LAB — WET PREP, GENITAL
TRICH WET PREP: NONE SEEN
YEAST WET PREP: NONE SEEN

## 2014-12-06 LAB — CYTOLOGY - PAP

## 2014-12-13 ENCOUNTER — Telehealth: Payer: Self-pay | Admitting: Family Medicine

## 2014-12-13 MED ORDER — AMOXICILLIN 500 MG PO CAPS: 500.0000 mg | ORAL_CAPSULE | Freq: Two times a day (BID) | ORAL | Status: DC

## 2014-12-13 NOTE — Telephone Encounter (Signed)
Strep exposure.

## 2014-12-14 ENCOUNTER — Telehealth: Payer: Self-pay | Admitting: *Deleted

## 2014-12-16 ENCOUNTER — Telehealth: Payer: Self-pay

## 2014-12-16 MED ORDER — FLUCONAZOLE 150 MG PO TABS: ORAL_TABLET | ORAL | Status: DC

## 2014-12-16 NOTE — Telephone Encounter (Signed)
Pt called requesting for Dicflucan to be sent to pharmacy for herself and  daughter Rhonda Oneal.

## 2014-12-16 NOTE — Telephone Encounter (Signed)
Rx sent 

## 2014-12-17 NOTE — Telephone Encounter (Signed)
Left detailed message.   

## 2015-01-19 ENCOUNTER — Encounter: Payer: Self-pay | Admitting: Family Medicine

## 2015-01-19 ENCOUNTER — Ambulatory Visit (INDEPENDENT_AMBULATORY_CARE_PROVIDER_SITE_OTHER): Payer: Federal, State, Local not specified - PPO | Admitting: Family Medicine

## 2015-01-19 VITALS — BP 132/93 | HR 98 | Wt 240.0 lb

## 2015-01-19 DIAGNOSIS — J020 Streptococcal pharyngitis: Secondary | ICD-10-CM | POA: Diagnosis not present

## 2015-01-19 MED ORDER — PENICILLIN V POTASSIUM 500 MG PO TABS: ORAL_TABLET | ORAL | Status: AC

## 2015-01-19 NOTE — Progress Notes (Signed)
CC: Rhonda Oneal is a 49 y.o. female is here for strep test   Subjective: HPI:  Headache, abdominal discomfort, fatigue, sweats that has been going on for the past 2 or 3 days. Another family member has been diagnosed with strep throat. Patient initially had some sore throat but this is resolved. No interventions as of yet. Symptoms are mild in severity. Denies rash. Denies decreased appetite, nausea, constipation or diarrhea. Denies cough or shortness of breath   Review Of Systems Outlined In HPI  Past Medical History  Diagnosis Date  . Morbid obesity (HCC)   . Fibromyalgia   . IBS (irritable bowel syndrome)   . Arthritis   . DERMATITIS, ATOPIC 02/09/2010    Qualifier: Diagnosis of  By: Orson AloeHenderson MD, Tinnie GensJeffrey    . EDEMA LEG 03/22/2010    Qualifier: Diagnosis of  By: Orson AloeHenderson MD, Tinnie GensJeffrey    . Asthma     Past Surgical History  Procedure Laterality Date  . Cesarean section    . Tonsillectomy and adenoidectomy    . Wisdom tooth extraction     Family History  Problem Relation Age of Onset  . Diabetes Mother   . Cancer Mother     Lung  . Breast cancer Maternal Aunt   . Ovarian cancer Paternal Aunt   . Hyperlipidemia Father     Social History   Social History  . Marital Status: Married    Spouse Name: N/A  . Number of Children: N/A  . Years of Education: N/A   Occupational History  . Not on file.   Social History Main Topics  . Smoking status: Never Smoker   . Smokeless tobacco: Never Used  . Alcohol Use: No  . Drug Use: No  . Sexual Activity:    Partners: Male    Birth Control/ Protection: Surgical   Other Topics Concern  . Not on file   Social History Narrative     Objective: BP 132/93 mmHg  Pulse 98  Wt 240 lb (108.863 kg)  Vital signs reviewed. General: Alert and Oriented, No Acute Distress HEENT: Pupils equal, round, reactive to light. Conjunctivae clear.  External ears unremarkable.  Moist mucous membranes. Lungs: Clear and comfortable work of  breathing, speaking in full sentences without accessory muscle use. Cardiac: Regular rate and rhythm.  Neuro: CN II-XII grossly intact, gait normal. Extremities: No peripheral edema.  Strong peripheral pulses.  Mental Status: No depression, anxiety, nor agitation. Logical though process. Skin: Warm and dry.  Assessment & Plan: Rhonda Oneal was seen today for strep test.  Diagnoses and all orders for this visit:  Strep pharyngitis -     penicillin v potassium (VEETID) 500 MG tablet; One by mouth every 12 hours for ten days, take 1 hour before or 2 hours after meals.   Both daughters have strep throat, will treat mother as well as likelihood is high given her symptoms matching the daughter who has strep.  Return if symptoms worsen or fail to improve.

## 2015-01-20 ENCOUNTER — Telehealth: Payer: Self-pay

## 2015-01-20 MED ORDER — ONDANSETRON 8 MG PO TBDP: 8.0000 mg | ORAL_TABLET | Freq: Three times a day (TID) | ORAL | Status: DC | PRN

## 2015-01-20 NOTE — Telephone Encounter (Signed)
Zofran rx'ed. 

## 2015-01-20 NOTE — Telephone Encounter (Signed)
Dr Ivan AnchorsHommel patient. She was seen yesterday by Dr Ivan AnchorsHommel. She still feels nauseated and would like medication sent in for nausea.

## 2015-01-21 NOTE — Telephone Encounter (Signed)
Patient advised.

## 2015-01-24 ENCOUNTER — Telehealth: Payer: Self-pay | Admitting: Family Medicine

## 2015-01-24 MED ORDER — PREDNISONE 20 MG PO TABS: ORAL_TABLET | ORAL | Status: AC

## 2015-01-24 NOTE — Telephone Encounter (Signed)
Facial pressure, cough, postnasal drip.

## 2015-02-24 ENCOUNTER — Ambulatory Visit (INDEPENDENT_AMBULATORY_CARE_PROVIDER_SITE_OTHER): Payer: Federal, State, Local not specified - PPO | Admitting: Family Medicine

## 2015-02-24 ENCOUNTER — Encounter: Payer: Self-pay | Admitting: Family Medicine

## 2015-02-24 VITALS — Wt 246.0 lb

## 2015-02-24 DIAGNOSIS — R0781 Pleurodynia: Secondary | ICD-10-CM

## 2015-02-24 MED ORDER — MELOXICAM 15 MG PO TABS: 15.0000 mg | ORAL_TABLET | Freq: Every day | ORAL | Status: DC

## 2015-02-24 NOTE — Progress Notes (Signed)
CC: Rhonda Oneal is a 50 y.o. female is here for Right Rib Pain   Subjective: HPI:  One half weeks of right-sided flank pain that's been present on a daily basis. It's worse when lying on the right rib. She's had this before in the past after an accident where she fell onto a counter decades ago. Symptoms came on without any inciting event that she is aware of other than lifting a heavy scooter that her daughter got for Christmas. She denies any pulmonary complaints or gastrointestinal complaints. She's had no nausea, vomiting, cough, wheezing or shortness of breath. She denies any pain with breathing. Symptoms are absent with cold packs are applied but will return within an hour. No benefit from ibuprofen. No other interventions as yet. Symptoms are mild in severity and slowly improving. She denies any overlying skin changes.   Review Of Systems Outlined In HPI  Past Medical History  Diagnosis Date  . Morbid obesity (HCC)   . Fibromyalgia   . IBS (irritable bowel syndrome)   . Arthritis   . DERMATITIS, ATOPIC 02/09/2010    Qualifier: Diagnosis of  By: Orson AloeHenderson MD, Tinnie GensJeffrey    . EDEMA LEG 03/22/2010    Qualifier: Diagnosis of  By: Orson AloeHenderson MD, Tinnie GensJeffrey    . Asthma     Past Surgical History  Procedure Laterality Date  . Cesarean section    . Tonsillectomy and adenoidectomy    . Wisdom tooth extraction     Family History  Problem Relation Age of Onset  . Diabetes Mother   . Cancer Mother     Lung  . Breast cancer Maternal Aunt   . Ovarian cancer Paternal Aunt   . Hyperlipidemia Father     Social History   Social History  . Marital Status: Married    Spouse Name: N/A  . Number of Children: N/A  . Years of Education: N/A   Occupational History  . Not on file.   Social History Main Topics  . Smoking status: Never Smoker   . Smokeless tobacco: Never Used  . Alcohol Use: No  . Drug Use: No  . Sexual Activity:    Partners: Male    Birth Control/ Protection: Surgical    Other Topics Concern  . Not on file   Social History Narrative     Objective: Wt 246 lb (111.585 kg)  Vital signs reviewed. General: Alert and Oriented, No Acute Distress HEENT: Pupils equal, round, reactive to light. Conjunctivae clear.  External ears unremarkable.  Moist mucous membranes. Lungs: Clear and comfortable work of breathing, speaking in full sentences without accessory muscle use. Cardiac: Regular rate and rhythm.  Abdomen: No right upper quadrant pain. There is reproduction of her pain when pressing on the distalmost aspect of the right 12th rib. No crepitus. Neuro: CN II-XII grossly intact, gait normal. Extremities: No peripheral edema.  Strong peripheral pulses.  Mental Status: No depression, anxiety, nor agitation. Logical though process. Skin: Warm and dry.  Assessment & Plan: Rich Fuchsamah was seen today for right rib pain.  Diagnoses and all orders for this visit:  Rib pain -     meloxicam (MOBIC) 15 MG tablet; Take 1 tablet (15 mg total) by mouth daily.   Suspect costochondritis versus intercostal muscle strain, starting meloxicam and home rehab exercises/stretches.  Call if no better on Monday and I"ll be happy to order an XRay  No Follow-up on file.

## 2015-02-25 ENCOUNTER — Other Ambulatory Visit: Payer: Self-pay | Admitting: Family Medicine

## 2015-03-17 ENCOUNTER — Other Ambulatory Visit: Payer: Self-pay | Admitting: Family Medicine

## 2015-03-17 ENCOUNTER — Encounter: Payer: Self-pay | Admitting: Family Medicine

## 2015-03-18 ENCOUNTER — Ambulatory Visit (INDEPENDENT_AMBULATORY_CARE_PROVIDER_SITE_OTHER): Payer: Federal, State, Local not specified - PPO | Admitting: Sports Medicine

## 2015-03-18 ENCOUNTER — Ambulatory Visit (INDEPENDENT_AMBULATORY_CARE_PROVIDER_SITE_OTHER): Payer: Federal, State, Local not specified - PPO

## 2015-03-18 VITALS — BP 128/90 | HR 111 | Temp 97.7°F | Resp 16 | Wt 252.8 lb

## 2015-03-18 DIAGNOSIS — M549 Dorsalgia, unspecified: Secondary | ICD-10-CM | POA: Diagnosis not present

## 2015-03-18 DIAGNOSIS — M5412 Radiculopathy, cervical region: Secondary | ICD-10-CM

## 2015-03-18 DIAGNOSIS — R0781 Pleurodynia: Secondary | ICD-10-CM

## 2015-03-18 LAB — BASIC METABOLIC PANEL WITH GFR
CO2: 25 mmol/L (ref 20–31)
Calcium: 8.5 mg/dL — ABNORMAL LOW (ref 8.6–10.2)
Sodium: 138 mmol/L (ref 135–146)

## 2015-03-18 LAB — BASIC METABOLIC PANEL
BUN: 9 mg/dL (ref 7–25)
Chloride: 103 mmol/L (ref 98–110)
Creat: 0.66 mg/dL (ref 0.50–1.10)
Glucose, Bld: 119 mg/dL — ABNORMAL HIGH (ref 65–99)
Potassium: 3.8 mmol/L (ref 3.5–5.3)

## 2015-03-18 MED ORDER — PROMETHAZINE HCL 25 MG PO TABS: 25.0000 mg | ORAL_TABLET | Freq: Four times a day (QID) | ORAL | Status: DC | PRN

## 2015-03-18 MED ORDER — PREDNISONE 50 MG PO TABS: ORAL_TABLET | ORAL | Status: DC

## 2015-03-18 MED ORDER — DIPHENHYDRAMINE HCL 50 MG PO TABS: 50.0000 mg | ORAL_TABLET | Freq: Every evening | ORAL | Status: DC | PRN

## 2015-03-18 MED ORDER — ONDANSETRON 8 MG PO TBDP: 8.0000 mg | ORAL_TABLET | Freq: Three times a day (TID) | ORAL | Status: DC | PRN

## 2015-03-18 NOTE — Progress Notes (Signed)
   Subjective:    I'm seeing this patient as a consultation for:  Dr. Laren Boom  CC: Mid back pain  HPI: For the past several days has pleasant 50 year old female has had pain that she localizes between her scapulae, with radiation into the left arm, worse with deep breathing. No shortness of breath chest pain. Symptoms are moderate, persistent. No recent periods of immobilization, long car rides, or surgery.  Past medical history, Surgical history, Family history not pertinant except as noted below, Social history, Allergies, and medications have been entered into the medical record, reviewed, and no changes needed.   Review of Systems: No headache, visual changes, nausea, vomiting, diarrhea, constipation, dizziness, abdominal pain, skin rash, fevers, chills, night sweats, weight loss, swollen lymph nodes, body aches, joint swelling, muscle aches, chest pain, shortness of breath, mood changes, visual or auditory hallucinations.   Objective:   General: Well Developed, well nourished, and in no acute distress.  Neuro/Psych: Alert and oriented x3, extra-ocular muscles intact, able to move all 4 extremities, sensation grossly intact. Skin: Warm and dry, no rashes noted.  Respiratory: Not using accessory muscles, speaking in full sentences, trachea midline. Chest wall is unremarkable. Cardiovascular: Pulses palpable, no extremity edema. Abdomen: Does not appear distended. Neck: Negative spurling's Full neck range of motion Grip strength and sensation normal in bilateral hands Strength good C4 to T1 distribution No sensory change to C4 to T1 Reflexes normal  Impression and Recommendations:   This case required medical decision making of moderate complexity.

## 2015-03-18 NOTE — Assessment & Plan Note (Signed)
With left sided periscapular type radiculopathy, worse with deep breathing. Formal physical therapy, prednisone, continue meloxicam. Return in one month, MRI for interventional injection planning if no better. Because she is having pleuritic-type pain we are going to also obtain a d-dimer.

## 2015-03-18 NOTE — Addendum Note (Signed)
Addended by: Monica Becton on: 03/18/2015 03:32 PM   Modules accepted: Orders

## 2015-03-18 NOTE — Assessment & Plan Note (Addendum)
Checking right-sided rib x-rays and a d-dimer.  D-dimer is elevated, stat CT angiogram

## 2015-03-19 LAB — D-DIMER, QUANTITATIVE: D-Dimer, Quant: 0.67 ug{FEU}/mL — ABNORMAL HIGH (ref 0.00–0.48)

## 2015-03-21 ENCOUNTER — Ambulatory Visit (HOSPITAL_BASED_OUTPATIENT_CLINIC_OR_DEPARTMENT_OTHER)
Admission: RE | Admit: 2015-03-21 | Discharge: 2015-03-21 | Disposition: A | Discharge status: Home / Self Care | Payer: Federal, State, Local not specified - PPO | Source: Ambulatory Visit | Attending: Sports Medicine | Admitting: Sports Medicine

## 2015-03-21 ENCOUNTER — Encounter (HOSPITAL_BASED_OUTPATIENT_CLINIC_OR_DEPARTMENT_OTHER): Payer: Self-pay

## 2015-03-21 DIAGNOSIS — M549 Dorsalgia, unspecified: Secondary | ICD-10-CM

## 2015-03-21 DIAGNOSIS — M954 Acquired deformity of chest and rib: Secondary | ICD-10-CM | POA: Insufficient documentation

## 2015-03-21 DIAGNOSIS — R791 Abnormal coagulation profile: Secondary | ICD-10-CM | POA: Insufficient documentation

## 2015-03-21 DIAGNOSIS — J9811 Atelectasis: Secondary | ICD-10-CM | POA: Diagnosis not present

## 2015-03-21 DIAGNOSIS — R079 Chest pain, unspecified: Secondary | ICD-10-CM | POA: Diagnosis not present

## 2015-03-21 MED ORDER — IOHEXOL 350 MG/ML SOLN
100.0000 mL | Freq: Once | INTRAVENOUS | Status: AC | PRN
  Administered 2015-03-21: 100 mL via INTRAVENOUS

## 2015-03-21 NOTE — Addendum Note (Signed)
Addended by: Monica Becton on: 03/21/2015 09:24 AM   Modules accepted: Orders

## 2015-03-30 ENCOUNTER — Ambulatory Visit (INDEPENDENT_AMBULATORY_CARE_PROVIDER_SITE_OTHER): Payer: Federal, State, Local not specified - PPO | Admitting: Family Medicine

## 2015-03-30 ENCOUNTER — Encounter: Payer: Self-pay | Admitting: Family Medicine

## 2015-03-30 VITALS — BP 135/63 | HR 118 | Temp 98.5°F | Wt 249.0 lb

## 2015-03-30 DIAGNOSIS — R05 Cough: Secondary | ICD-10-CM

## 2015-03-30 DIAGNOSIS — H10021 Other mucopurulent conjunctivitis, right eye: Secondary | ICD-10-CM

## 2015-03-30 DIAGNOSIS — R059 Cough, unspecified: Secondary | ICD-10-CM

## 2015-03-30 MED ORDER — AZITHROMYCIN 250 MG PO TABS
ORAL_TABLET | ORAL | Status: AC
Start: 2015-03-30 — End: 2015-04-04

## 2015-03-30 MED ORDER — POLYMYXIN B-TRIMETHOPRIM 10000-0.1 UNIT/ML-% OP SOLN: 2.0000 [drp] | OPHTHALMIC | Status: DC

## 2015-03-30 NOTE — Progress Notes (Signed)
CC: Rhonda Oneal is a 49 y.o. female is here for URI   Subjective: HPI:  Cough that began 4 days ago described as productive with loss of voice as the day progresses. Nothing seems to make symptoms better or worse that she knows of other than above. She's had some chills but denies fevers or night sweats. Denies wheezing, shortness of breath or chest discomfort. Multiple sick contacts at home with similar symptoms. She also noticed redness in her right eye that began yesterday. Occasionally she'll have her eye matted shut in the morning but denies vision loss or photophobia. Denies pain with moving the right eye. Denies left eye complaints. No interventions as of yet. Denies joint pain, rash or confusion.   Review Of Systems Outlined In HPI  Past Medical History  Diagnosis Date  . Morbid obesity (HCC)   . Fibromyalgia   . IBS (irritable bowel syndrome)   . Arthritis   . DERMATITIS, ATOPIC 02/09/2010    Qualifier: Diagnosis of  By: Orson Aloe MD, Tinnie Gens    . EDEMA LEG 03/22/2010    Qualifier: Diagnosis of  By: Orson Aloe MD, Tinnie Gens    . Asthma     Past Surgical History  Procedure Laterality Date  . Cesarean section    . Tonsillectomy and adenoidectomy    . Wisdom tooth extraction     Family History  Problem Relation Age of Onset  . Diabetes Mother   . Cancer Mother     Lung  . Breast cancer Maternal Aunt   . Ovarian cancer Paternal Aunt   . Hyperlipidemia Father     Social History   Social History  . Marital Status: Married    Spouse Name: N/A  . Number of Children: N/A  . Years of Education: N/A   Occupational History  . Not on file.   Social History Main Topics  . Smoking status: Never Smoker   . Smokeless tobacco: Never Used  . Alcohol Use: No  . Drug Use: No  . Sexual Activity:    Partners: Male    Birth Control/ Protection: Surgical   Other Topics Concern  . Not on file   Social History Narrative     Objective: BP 135/63 mmHg  Pulse 118   Temp(Src) 98.5 F (36.9 C) (Oral)  Wt 249 lb (112.946 kg)  LMP 02/22/2015 (Approximate)  General: Alert and Oriented, No Acute Distress HEENT: Pupils equal, round, reactive to light. leftConjunctivae clear, right conjunctiva with moderate peripheral erythema with anterior chamber open without debris..  External ears unremarkable, canals clear with intact TMs with appropriate landmarks.  Middle ear appears open without effusion. Pink inferior turbinates.  Moist mucous membranes, pharynx without inflammation nor lesions.  Neck supple without palpable lymphadenopathy nor abnormal masses. Lungs: Clear to auscultation bilaterally, no wheezing/ronchi/rales.  Comfortable work of breathing. Good air movement.occasional coughing Extremities: No peripheral edema.  Strong peripheral pulses.  Mental Status: No depression, anxiety, nor agitation. Skin: Warm and dry.  Assessment & Plan: Rhonda Oneal was seen today for uri.  Diagnoses and all orders for this visit:  Pink eye disease of right eye -     trimethoprim-polymyxin b (POLYTRIM) ophthalmic solution; Place 2 drops into the right eye every 4 (four) hours. For 8 days.  Cough -     azithromycin (ZITHROMAX) 250 MG tablet; Take two tabs at once on day 1, then one tab daily on days 2-5.   [place]: Vision was checked with right eye vision 20/50, she admits that she  is not wearing her glasses today which complicated interpretation of her vision. I discussed with her that there is a slim possibility that she could have interocular infection that would require emergent evaluation however I would expect that she would develop photophobia, severe vision loss, and pain with movement of the eye if this were the case. Discussed looking for the symptoms to present to emergency room as soon as possible if this occurs. Start Polytrim for the eye and azithromycin for her cough.  Return if symptoms worsen or fail to improve.

## 2015-04-04 ENCOUNTER — Ambulatory Visit: Payer: Federal, State, Local not specified - PPO | Admitting: Rehabilitative and Restorative Service Providers"

## 2015-04-08 ENCOUNTER — Ambulatory Visit: Payer: Federal, State, Local not specified - PPO | Admitting: Rehabilitative and Restorative Service Providers"

## 2015-04-13 ENCOUNTER — Other Ambulatory Visit: Payer: Self-pay | Admitting: Family Medicine

## 2015-06-07 ENCOUNTER — Telehealth: Payer: Self-pay | Admitting: Family Medicine

## 2015-06-07 ENCOUNTER — Encounter: Payer: Self-pay | Admitting: Family Medicine

## 2015-06-07 ENCOUNTER — Ambulatory Visit (INDEPENDENT_AMBULATORY_CARE_PROVIDER_SITE_OTHER): Payer: Federal, State, Local not specified - PPO | Admitting: Family Medicine

## 2015-06-07 VITALS — BP 150/96 | HR 92 | Wt 250.0 lb

## 2015-06-07 DIAGNOSIS — J3489 Other specified disorders of nose and nasal sinuses: Secondary | ICD-10-CM | POA: Diagnosis not present

## 2015-06-07 MED ORDER — CLINDAMYCIN HCL 300 MG PO CAPS: 300.0000 mg | ORAL_CAPSULE | Freq: Three times a day (TID) | ORAL | Status: DC

## 2015-06-07 MED ORDER — MUPIROCIN CALCIUM 2 % NA OINT: 1.0000 "application " | TOPICAL_OINTMENT | Freq: Two times a day (BID) | NASAL | Status: DC

## 2015-06-07 NOTE — Telephone Encounter (Signed)
Pt advised.

## 2015-06-07 NOTE — Progress Notes (Signed)
CC: Rhonda Oneal is a 50 y.o. female is here for sore nose   Subjective: HPI:  Nasal pain localized on the front of the nose and also on the internal septum if ever pressed. She tells me it feels sore like she got punched in the nose but cannot recall any trauma or recent illness. No interventions as of yet an waiting for the past 2 weeks. Symptoms are mild in severity and worse with touch. She denies any nasal discharge, fevers, chills, sore throat or sinus pressure.    Review Of Systems Outlined In HPI  Past Medical History  Diagnosis Date  . Morbid obesity (HCC)   . Fibromyalgia   . IBS (irritable bowel syndrome)   . Arthritis   . DERMATITIS, ATOPIC 02/09/2010    Qualifier: Diagnosis of  By: Orson AloeHenderson MD, Tinnie GensJeffrey    . EDEMA LEG 03/22/2010    Qualifier: Diagnosis of  By: Orson AloeHenderson MD, Tinnie GensJeffrey    . Asthma     Past Surgical History  Procedure Laterality Date  . Cesarean section    . Tonsillectomy and adenoidectomy    . Wisdom tooth extraction     Family History  Problem Relation Age of Onset  . Diabetes Mother   . Cancer Mother     Lung  . Breast cancer Maternal Aunt   . Ovarian cancer Paternal Aunt   . Hyperlipidemia Father     Social History   Social History  . Marital Status: Married    Spouse Name: N/A  . Number of Children: N/A  . Years of Education: N/A   Occupational History  . Not on file.   Social History Main Topics  . Smoking status: Never Smoker   . Smokeless tobacco: Never Used  . Alcohol Use: No  . Drug Use: No  . Sexual Activity:    Partners: Male    Birth Control/ Protection: Surgical   Other Topics Concern  . Not on file   Social History Narrative     Objective: BP 150/96 mmHg  Pulse 92  Wt 250 lb (113.399 kg)  General: Alert and Oriented, No Acute Distress HEENT: Pupils equal, round, reactive to light. Conjunctivae clear.  External ears unremarkable, canals clear with intact TMs with appropriate landmarks.  Middle ear appears  open without effusion. Pink inferior turbinates, septum is moderately erythematous and tender to the touch.  Moist mucous membranes, pharynx without inflammation nor lesions.  Neck supple without palpable lymphadenopathy nor abnormal masses. Lungs: clear and comfortable work of breathing Extremities: No peripheral edema.  Strong peripheral pulses.  Mental Status: No depression, anxiety, nor agitation. Skin: Warm and dry.  Assessment & Plan: Rhonda Oneal was seen today for sore nose.  Diagnoses and all orders for this visit:  Nasal pain -     mupirocin nasal ointment (BACTROBAN NASAL) 2 %; Place 1 application into the nose 2 (two) times daily. Use one-half of tube in each nostril twice daily for 7 days. After application, press sides of nose together and gently massage.   Possible mild folliculitis involving the nasal hairs therefore start Bactroban, she'll be returning tomorrow to have her daughter evaluated for a similar issue and will go over whether or not she's seen any response at that time.  No Follow-up on file.

## 2015-06-07 NOTE — Telephone Encounter (Signed)
Pt states she went to the pharmacy to get Rx for mupirocin nasal ointment and it was $75. Will route to PCP to see if there is a cheaper alternative.

## 2015-06-07 NOTE — Telephone Encounter (Signed)
Switch to oral clindamycin which will be much cheaper, rx has been sent

## 2015-06-13 ENCOUNTER — Ambulatory Visit: Payer: Federal, State, Local not specified - PPO | Admitting: Family Medicine

## 2015-07-06 ENCOUNTER — Telehealth: Payer: Self-pay

## 2015-07-06 ENCOUNTER — Encounter: Payer: Self-pay | Admitting: Family Medicine

## 2015-07-06 ENCOUNTER — Ambulatory Visit (INDEPENDENT_AMBULATORY_CARE_PROVIDER_SITE_OTHER): Payer: Federal, State, Local not specified - PPO | Admitting: Family Medicine

## 2015-07-06 VITALS — BP 128/86 | HR 96 | Wt 246.0 lb

## 2015-07-06 DIAGNOSIS — M5416 Radiculopathy, lumbar region: Secondary | ICD-10-CM

## 2015-07-06 DIAGNOSIS — M797 Fibromyalgia: Secondary | ICD-10-CM

## 2015-07-06 MED ORDER — MILNACIPRAN HCL 50 MG PO TABS: 50.0000 mg | ORAL_TABLET | Freq: Two times a day (BID) | ORAL | Status: DC

## 2015-07-06 MED ORDER — GABAPENTIN 800 MG PO TABS: ORAL_TABLET | ORAL | Status: DC

## 2015-07-06 NOTE — Progress Notes (Signed)
CC: Rhonda Oneal is a 50 y.o. female is here for Neck Pain and Dizziness   Subjective: HPI:  For the past 2 or 3 days she's felt that her muscles are sensitive to touch, also her skin is sensitive to the touch. Symptoms are moderate in severity and nothing seems to make them worse but it did slightly improved with gabapentin that she had left over. She denies any known side effects from gabapentin. She denies any weakness or any other motor or sensory disturbances. Symptoms are persistent since onset. She also feels like she is in a mental fall further described as dizziness but is not having any balance difficulty. She denies any fevers, chills, cough, shortness of breath, nor dark urine   Review Of Systems Outlined In HPI  Past Medical History  Diagnosis Date  . Morbid obesity (HCC)   . Fibromyalgia   . IBS (irritable bowel syndrome)   . Arthritis   . DERMATITIS, ATOPIC 02/09/2010    Qualifier: Diagnosis of  By: Orson Aloe MD, Tinnie Gens    . EDEMA LEG 03/22/2010    Qualifier: Diagnosis of  By: Orson Aloe MD, Tinnie Gens    . Asthma     Past Surgical History  Procedure Laterality Date  . Cesarean section    . Tonsillectomy and adenoidectomy    . Wisdom tooth extraction     Family History  Problem Relation Age of Onset  . Diabetes Mother   . Cancer Mother     Lung  . Breast cancer Maternal Aunt   . Ovarian cancer Paternal Aunt   . Hyperlipidemia Father     Social History   Social History  . Marital Status: Married    Spouse Name: N/A  . Number of Children: N/A  . Years of Education: N/A   Occupational History  . Not on file.   Social History Main Topics  . Smoking status: Never Smoker   . Smokeless tobacco: Never Used  . Alcohol Use: No  . Drug Use: No  . Sexual Activity:    Partners: Male    Birth Control/ Protection: Surgical   Other Topics Concern  . Not on file   Social History Narrative     Objective: BP 128/86 mmHg  Pulse 96  Wt 246 lb (111.585  kg)  Vital signs reviewed. General: Alert and Oriented, No Acute Distress HEENT: Pupils equal, round, reactive to light. Conjunctivae clear.  External ears unremarkable.  Moist mucous membranes. Lungs: Clear and comfortable work of breathing, speaking in full sentences without accessory muscle use. Cardiac: Regular rate and rhythm.  Neuro: CN II-XII grossly intact, gait normal. Extremities: No peripheral edema.  Strong peripheral pulses. Pain is localized to the muscle bellies and not involving any of her joints  Mental Status: No depression, anxiety, nor agitation. Logical though process. Skin: Warm and dry.  Assessment & Plan: [name] was seen today for neck pain and dizziness.  Diagnoses and all orders for this visit:  Fibromyalgia -     Discontinue: Milnacipran (SAVELLA) 50 MG TABS tablet; Take 1 tablet (50 mg total) by mouth 2 (two) times daily.   She has a remote history of fibromyalgia, it is possible that this is again what she is dealing with today. I encouraged her to start on Lyrica however she's not ready to invest in his medication if she doesn't know it's going to work 100%. We do not have samples today for this. The next option was Savella and a prescription was given to  her however is too expensive after she left our office and alternately gabapentin was increased. If she is not getting benefit by Friday of estrogen call me so I can contact their correct to get samples.  Return if symptoms worsen or fail to improve.

## 2015-07-06 NOTE — Telephone Encounter (Signed)
Pt reports that she is taking 400 mg TID.

## 2015-07-06 NOTE — Telephone Encounter (Signed)
Right a new regimen of 800 mg capsules has been sent to her pharmacy. If this doesn't help by Friday please call me and I'll get in touch with the Lyrica rep about looking into samples.

## 2015-07-06 NOTE — Telephone Encounter (Signed)
Okthen what regimen of gabapentin is she currently taking, she tole me it was different than what we have listed, I'll adjust this based on what she's currently taking.

## 2015-07-06 NOTE — Telephone Encounter (Signed)
Patient called and states the pharmacy said there is not generic for Rhonda Oneal. The cost of the Savella to her would be $141 and she cannot afford to pay that amount. Please advise.

## 2015-07-06 NOTE — Telephone Encounter (Signed)
Pt.notified

## 2015-07-20 NOTE — Telephone Encounter (Signed)
Pt called to see if Lyrica samples came in for her. Will route to PCP for review.

## 2015-07-20 NOTE — Telephone Encounter (Signed)
Rhonda Oneal, Will you please let patient know that we haven't received them yet.  Can you also call the [employer] rep (I think we have her card up front) and ask if I can have some of the 50mg  capsule samples.

## 2015-07-22 NOTE — Telephone Encounter (Signed)
Called [employer] rep Pam and left message requesting samples of lyrica 50 mg, also pt notified

## 2015-07-29 NOTE — Telephone Encounter (Signed)
Pt notified of samples 

## 2015-09-02 ENCOUNTER — Telehealth: Payer: Self-pay

## 2015-09-02 ENCOUNTER — Telehealth: Payer: Self-pay | Admitting: Family Medicine

## 2015-09-02 ENCOUNTER — Other Ambulatory Visit: Payer: Self-pay | Admitting: Family Medicine

## 2015-09-02 ENCOUNTER — Other Ambulatory Visit: Payer: Self-pay | Admitting: Sports Medicine

## 2015-09-02 DIAGNOSIS — R0781 Pleurodynia: Secondary | ICD-10-CM

## 2015-09-02 MED ORDER — PREDNISONE 10 MG (21) PO TBPK: ORAL_TABLET | ORAL | Status: DC

## 2015-09-02 NOTE — Telephone Encounter (Signed)
Rhonda Oneal called and states she used a different wash detergent and it caused itching. She has been taking benadryl and has had no relief. She wanted to know if she could get prednisone called in. Please advise.

## 2015-09-02 NOTE — Telephone Encounter (Signed)
Taper dose pack called in.

## 2015-09-05 MED ORDER — MELOXICAM 15 MG PO TABS: 15.0000 mg | ORAL_TABLET | Freq: Every day | ORAL | Status: DC

## 2015-09-05 NOTE — Telephone Encounter (Signed)
Left message advising of medication.  

## 2015-09-05 NOTE — Telephone Encounter (Signed)
rx sent to [facility]

## 2015-09-09 ENCOUNTER — Ambulatory Visit: Payer: Federal, State, Local not specified - PPO | Admitting: Sports Medicine

## 2015-09-12 ENCOUNTER — Ambulatory Visit: Payer: Federal, State, Local not specified - PPO | Admitting: Sports Medicine

## 2015-09-15 ENCOUNTER — Other Ambulatory Visit: Payer: Self-pay | Admitting: Obstetrics & Gynecology

## 2015-09-22 ENCOUNTER — Ambulatory Visit: Payer: Federal, State, Local not specified - PPO | Admitting: Sports Medicine

## 2015-09-23 ENCOUNTER — Ambulatory Visit (INDEPENDENT_AMBULATORY_CARE_PROVIDER_SITE_OTHER): Payer: Federal, State, Local not specified - PPO | Admitting: Family Medicine

## 2015-09-23 ENCOUNTER — Encounter: Payer: Self-pay | Admitting: Family Medicine

## 2015-09-23 VITALS — BP 168/84 | HR 101 | Wt 248.0 lb

## 2015-09-23 DIAGNOSIS — Z91048 Other nonmedicinal substance allergy status: Secondary | ICD-10-CM

## 2015-09-23 DIAGNOSIS — M545 Low back pain, unspecified: Secondary | ICD-10-CM

## 2015-09-23 DIAGNOSIS — Z9109 Other allergy status, other than to drugs and biological substances: Secondary | ICD-10-CM

## 2015-09-23 MED ORDER — MELOXICAM 15 MG PO TABS: 15.0000 mg | ORAL_TABLET | Freq: Every day | ORAL | 2 refills | Status: DC

## 2015-09-23 MED ORDER — METHYLPREDNISOLONE ACETATE 80 MG/ML IJ SUSP
80.0000 mg | Freq: Once | INTRAMUSCULAR | Status: AC
  Administered 2015-09-23: 80 mg via INTRAMUSCULAR

## 2015-09-23 NOTE — Progress Notes (Signed)
CC: Rhonda Oneal is a 50 y.o. female is here for Sinus Problem   Subjective: HPI:  Left low back pain for the past week. It's worse when ending over. It's localized to the lateral aspect of the back. She denies any midline pain or radiation. She denies any recent or remote trauma. Denies any overlying skin changes. Symptoms are mild in severity but she is requesting a steroid injection today in hopes of helping relieve the pain. She's had no benefit from doing gentle stretches and range of motion exercises. She denies any genitourinary complaints or gastrointestinal complaints.  Additionally she's had some nasal congestion, postnasal drip and nonproductive cough for the past week. Denies fevers chills or shortness of breath   Review Of Systems Outlined In HPI  Past Medical History:  Diagnosis Date  . Arthritis   . Asthma   . DERMATITIS, ATOPIC 02/09/2010   Qualifier: Diagnosis of  By: Orson Aloe MD, Tinnie Gens    . EDEMA LEG 03/22/2010   Qualifier: Diagnosis of  By: Orson Aloe MD, Tinnie Gens    . Fibromyalgia   . IBS (irritable bowel syndrome)   . Morbid obesity (HCC)     Past Surgical History:  Procedure Laterality Date  . CESAREAN SECTION    . TONSILLECTOMY AND ADENOIDECTOMY    . WISDOM TOOTH EXTRACTION     Family History  Problem Relation Age of Onset  . Diabetes Mother   . Cancer Mother     Lung  . Breast cancer Maternal Aunt   . Ovarian cancer Paternal Aunt   . Hyperlipidemia Father     Social History   Social History  . Marital status: Married    Spouse name: N/A  . Number of children: N/A  . Years of education: N/A   Occupational History  . Not on file.   Social History Main Topics  . Smoking status: Never Smoker  . Smokeless tobacco: Never Used  . Alcohol use No  . Drug use: No  . Sexual activity: Yes    Partners: Male    Birth control/ protection: Surgical   Other Topics Concern  . Not on file   Social History Narrative  . No narrative on file      Objective: BP (!) 168/84   Pulse (!) 101   Wt 248 lb (112.5 kg)   BMI 45.36 kg/m   General: Alert and Oriented, No Acute Distress HEENT: Pupils equal, round, reactive to light. Conjunctivae clear.  External ears unremarkable, canals clear with intact TMs with appropriate landmarks.  Middle ear appears open without effusion. Pink inferior turbinates.  Moist mucous membranes, pharynx without inflammation nor lesions.  Neck supple without palpable lymphadenopathy nor abnormal masses. Lungs: Clear to auscultation bilaterally, no wheezing/ronchi/rales.  Comfortable work of breathing. Good air movement. Back: No midline spinous process tenderness in the lumbar region no reproduction of her pain with palpation Extremities: No peripheral edema.  Strong peripheral pulses.  Mental Status: No depression, anxiety, nor agitation. Skin: Warm and dry.  Assessment & Plan: Rhonda Oneal was seen today for sinus problem.  Diagnoses and all orders for this visit:  Left-sided low back pain without sciatica -     meloxicam (MOBIC) 15 MG tablet; Take 1 tablet (15 mg total) by mouth daily.  Environmental allergies    Left-sided back pain: As needed meloxicam, she is asking for Depo-Medrol today which seems reasonable. Continue gentle stretches Environmental allergens: Discussed that her Depo-Medrol shot actually will help with this as well. No Follow-up on  file.

## 2015-09-23 NOTE — Addendum Note (Signed)
Addended by: Thom Chimes on: 09/23/2015 04:26 PM   Modules accepted: Orders

## 2015-09-26 ENCOUNTER — Telehealth: Payer: Self-pay

## 2015-09-26 MED ORDER — PREDNISONE 20 MG PO TABS: ORAL_TABLET | ORAL | 0 refills | Status: AC

## 2015-09-26 NOTE — Telephone Encounter (Signed)
5 day course of prednisone [facility], similar to what her daughter received last week.

## 2015-09-26 NOTE — Telephone Encounter (Signed)
Pt.notified

## 2015-10-25 ENCOUNTER — Ambulatory Visit (INDEPENDENT_AMBULATORY_CARE_PROVIDER_SITE_OTHER): Payer: Federal, State, Local not specified - PPO | Admitting: Family Medicine

## 2015-10-25 ENCOUNTER — Encounter: Payer: Self-pay | Admitting: Family Medicine

## 2015-10-25 VITALS — BP 137/94 | HR 82 | Wt 245.0 lb

## 2015-10-25 DIAGNOSIS — A499 Bacterial infection, unspecified: Secondary | ICD-10-CM | POA: Diagnosis not present

## 2015-10-25 DIAGNOSIS — J329 Chronic sinusitis, unspecified: Secondary | ICD-10-CM

## 2015-10-25 DIAGNOSIS — B9689 Other specified bacterial agents as the cause of diseases classified elsewhere: Secondary | ICD-10-CM

## 2015-10-25 MED ORDER — AMOXICILLIN-POT CLAVULANATE 500-125 MG PO TABS: ORAL_TABLET | ORAL | 0 refills | Status: AC

## 2015-10-25 NOTE — Progress Notes (Signed)
CC: Rhonda Oneal is a 50 y.o. female is here for Sinusitis   Subjective: HPI:  Facial pressure in the forehead with nasal congestion and postnasal drip. Present for the last 6-7 days. No benefit from over-the-counter cough and cold medications, decongestants, Excedrin. Denies fevers or chills. Reports fatigue yesterday but she is not used to. Denies any cough shortness of breath or wheezing. Denies confusion or any motor or sensory disturbances. Symptoms are worse as the day progresses   Review Of Systems Outlined In HPI  Past Medical History:  Diagnosis Date  . Arthritis   . Asthma   . DERMATITIS, ATOPIC 02/09/2010   Qualifier: Diagnosis of  By: Orson AloeHenderson MD, Tinnie GensJeffrey    . EDEMA LEG 03/22/2010   Qualifier: Diagnosis of  By: Orson AloeHenderson MD, Tinnie GensJeffrey    . Fibromyalgia   . IBS (irritable bowel syndrome)   . Morbid obesity (HCC)     Past Surgical History:  Procedure Laterality Date  . CESAREAN SECTION    . TONSILLECTOMY AND ADENOIDECTOMY    . WISDOM TOOTH EXTRACTION     Family History  Problem Relation Age of Onset  . Diabetes Mother   . Cancer Mother     Lung  . Breast cancer Maternal Aunt   . Ovarian cancer Paternal Aunt   . Hyperlipidemia Father     Social History   Social History  . Marital status: Married    Spouse name: N/A  . Number of children: N/A  . Years of education: N/A   Occupational History  . Not on file.   Social History Main Topics  . Smoking status: Never Smoker  . Smokeless tobacco: Never Used  . Alcohol use No  . Drug use: No  . Sexual activity: Yes    Partners: Male    Birth control/ protection: Surgical   Other Topics Concern  . Not on file   Social History Narrative  . No narrative on file     Objective: BP (!) 137/94   Pulse 82   Wt 245 lb (111.1 kg)   BMI 44.81 kg/m   General: Alert and Oriented, No Acute Distress HEENT: Pupils equal, round, reactive to light. Conjunctivae clear.  External ears unremarkable, canals clear  with intact TMs with appropriate landmarks.  Middle ear appears open without effusion. Pink inferior turbinates.  Moist mucous membranes, pharynx without inflammation nor lesions.  Neck supple without palpable lymphadenopathy nor abnormal masses. Lungs: Clear to auscultation bilaterally, no wheezing/ronchi/rales.  Comfortable work of breathing. Good air movement. Cardiac: Regular rate and rhythm. Normal S1/S2.  No murmurs, rubs, nor gallops.   Mental Status: No depression, anxiety, nor agitation. Skin: Warm and dry.  Assessment & Plan: Rhonda Oneal was seen today for sinusitis.  Diagnoses and all orders for this visit:  Bacterial sinusitis  Other orders -     amoxicillin-clavulanate (AUGMENTIN) 500-125 MG tablet; Take one by mouth every 8 hours for ten total days.   Start Augmentin and consider nasal saline washes.Signs and symptoms requring emergent/urgent reevaluation were discussed with the patient.  Return if symptoms worsen or fail to improve.

## 2015-10-26 ENCOUNTER — Encounter: Payer: Self-pay | Admitting: Family Medicine

## 2015-10-26 MED ORDER — ONDANSETRON HCL 4 MG PO TABS: 4.0000 mg | ORAL_TABLET | Freq: Three times a day (TID) | ORAL | 1 refills | Status: DC | PRN

## 2015-10-31 ENCOUNTER — Encounter: Payer: Self-pay | Admitting: Family Medicine

## 2015-10-31 ENCOUNTER — Other Ambulatory Visit: Payer: Self-pay | Admitting: Family Medicine

## 2015-12-15 ENCOUNTER — Ambulatory Visit (INDEPENDENT_AMBULATORY_CARE_PROVIDER_SITE_OTHER): Payer: Federal, State, Local not specified - PPO | Admitting: Sports Medicine

## 2015-12-15 ENCOUNTER — Encounter: Payer: Self-pay | Admitting: Sports Medicine

## 2015-12-15 DIAGNOSIS — M7070 Other bursitis of hip, unspecified hip: Secondary | ICD-10-CM

## 2015-12-15 DIAGNOSIS — J0100 Acute maxillary sinusitis, unspecified: Secondary | ICD-10-CM | POA: Diagnosis not present

## 2015-12-15 MED ORDER — AMOXICILLIN-POT CLAVULANATE 875-125 MG PO TABS: 1.0000 | ORAL_TABLET | Freq: Two times a day (BID) | ORAL | 0 refills | Status: AC

## 2015-12-15 MED ORDER — FLUCONAZOLE 150 MG PO TABS: 150.0000 mg | ORAL_TABLET | Freq: Once | ORAL | 0 refills | Status: AC

## 2015-12-15 NOTE — Assessment & Plan Note (Signed)
Bilateral. She will take over-the-counter anti-inflammatories, rehabilitation exercises given. If no improvement over one month I'm happy to inject.

## 2015-12-15 NOTE — Assessment & Plan Note (Signed)
Flonase, Augmentin, Diflucan. Return as needed.

## 2015-12-15 NOTE — Progress Notes (Signed)
   Subjective:    I'm seeing this patient as a consultation for:  Dr. Laren BoomSean Hommel  CC: Pain in both buttocks  HPI: For the past several weeks this pleasant 50 year old female has had increasing pain that she localizes on the medial aspect of both buttocks, worse with sitting. Better with standing. Moderate, persistent without radiation, no radicular pain, no trauma, no bowel or bladder dysfunction or saddle numbness. No constitutional symptoms.  Sinus infection: For the past week has had increasing pain and pressure behind maxillary sinuses with cough, purulent nasal discharge.  Past medical history:  Negative.  See flowsheet/record as well for more information.  Surgical history: Negative.  See flowsheet/record as well for more information.  Family history: Negative.  See flowsheet/record as well for more information.  Social history: Negative.  See flowsheet/record as well for more information.  Allergies, and medications have been entered into the medical record, reviewed, and no changes needed.   Review of Systems: No headache, visual changes, nausea, vomiting, diarrhea, constipation, dizziness, abdominal pain, skin rash, fevers, chills, night sweats, weight loss, swollen lymph nodes, body aches, joint swelling, muscle aches, chest pain, shortness of breath, mood changes, visual or auditory hallucinations.   Objective:   General: Well Developed, well nourished, and in no acute distress.  Neuro/Psych: Alert and oriented x3, extra-ocular muscles intact, able to move all 4 extremities, sensation grossly intact. Skin: Warm and dry, no rashes noted.  Respiratory: Not using accessory muscles, speaking in full sentences, trachea midline. Lungs are clear to auscultation bilaterally. Oropharynx, nasopharynx, ear canals unremarkable with the exception of boggy and erythematous turbinates. Cardiovascular: Pulses palpable, no extremity edema. Abdomen: Does not appear distended. Bilateral  hips: ROM IR: 60 Deg, ER: 60 Deg, Flexion: 120 Deg, Extension: 100 Deg, Abduction: 45 Deg, Adduction: 45 Deg Strength IR: 5/5, ER: 5/5, Flexion: 5/5, Extension: 5/5, Abduction: 5/5, Adduction: 5/5 Pelvic alignment unremarkable to inspection and palpation. Standing hip rotation and gait without trendelenburg / unsteadiness. Greater trochanter without tenderness to palpation. No tenderness over piriformis. No SI joint tenderness and normal minimal SI movement. Tenderness to palpation of the ischial tuberosity bilaterally.  Impression and Recommendations:   This case required medical decision making of moderate complexity.  Ischial bursitis Bilateral. She will take over-the-counter anti-inflammatories, rehabilitation exercises given. If no improvement over one month I'm happy to inject.  Acute non-recurrent maxillary sinusitis Flonase, Augmentin, Diflucan. Return as needed.

## 2015-12-16 ENCOUNTER — Telehealth: Payer: Self-pay

## 2015-12-16 ENCOUNTER — Other Ambulatory Visit: Payer: Self-pay | Admitting: Obstetrics & Gynecology

## 2015-12-16 MED ORDER — BENZONATATE 200 MG PO CAPS: 200.0000 mg | ORAL_CAPSULE | Freq: Three times a day (TID) | ORAL | 0 refills | Status: DC | PRN

## 2015-12-16 NOTE — Telephone Encounter (Signed)
Patient aware of medication

## 2015-12-16 NOTE — Telephone Encounter (Signed)
It's been less than a day of treatment, [facility] but she needs to give it more time.

## 2015-12-16 NOTE — Telephone Encounter (Signed)
Rhonda Oneal called and reports she still has a cough and would like medication for the cough. The cough is worse at night. She was advised to call back if not better. Please advise.

## 2015-12-19 ENCOUNTER — Ambulatory Visit: Payer: Federal, State, Local not specified - PPO | Admitting: Sports Medicine

## 2015-12-23 ENCOUNTER — Telehealth: Payer: Self-pay

## 2015-12-23 MED ORDER — FLUCONAZOLE 150 MG PO TABS: 150.0000 mg | ORAL_TABLET | Freq: Once | ORAL | 0 refills | Status: AC

## 2015-12-23 NOTE — Telephone Encounter (Signed)
Done

## 2015-12-23 NOTE — Telephone Encounter (Signed)
Rhonda Oneal complains of vaginal itching after her last dose of the antibiotic. Denies discharge, pelvic pain, fever, chills or sweats. She wants a prescription for diflucan. Please advise.

## 2016-01-02 ENCOUNTER — Other Ambulatory Visit: Payer: Self-pay

## 2016-01-02 DIAGNOSIS — M545 Low back pain, unspecified: Secondary | ICD-10-CM

## 2016-01-02 DIAGNOSIS — G8929 Other chronic pain: Secondary | ICD-10-CM

## 2016-01-02 MED ORDER — MELOXICAM 15 MG PO TABS: 15.0000 mg | ORAL_TABLET | Freq: Every day | ORAL | 0 refills | Status: DC

## 2016-01-17 ENCOUNTER — Encounter: Payer: Self-pay | Admitting: Osteopathic Medicine

## 2016-01-17 ENCOUNTER — Ambulatory Visit (INDEPENDENT_AMBULATORY_CARE_PROVIDER_SITE_OTHER): Payer: Federal, State, Local not specified - PPO | Admitting: Osteopathic Medicine

## 2016-01-17 ENCOUNTER — Ambulatory Visit: Payer: Federal, State, Local not specified - PPO | Admitting: Osteopathic Medicine

## 2016-01-17 VITALS — BP 142/99 | HR 98 | Temp 98.6°F | Ht 61.0 in | Wt 241.0 lb

## 2016-01-17 DIAGNOSIS — J329 Chronic sinusitis, unspecified: Secondary | ICD-10-CM

## 2016-01-17 DIAGNOSIS — Z0000 Encounter for general adult medical examination without abnormal findings: Secondary | ICD-10-CM

## 2016-01-17 DIAGNOSIS — B9789 Other viral agents as the cause of diseases classified elsewhere: Secondary | ICD-10-CM | POA: Diagnosis not present

## 2016-01-17 DIAGNOSIS — J069 Acute upper respiratory infection, unspecified: Secondary | ICD-10-CM

## 2016-01-17 DIAGNOSIS — Z8709 Personal history of other diseases of the respiratory system: Secondary | ICD-10-CM | POA: Diagnosis not present

## 2016-01-17 DIAGNOSIS — J310 Chronic rhinitis: Secondary | ICD-10-CM

## 2016-01-17 MED ORDER — IPRATROPIUM BROMIDE 0.03 % NA SOLN: 2.0000 | Freq: Three times a day (TID) | NASAL | 0 refills | Status: DC | PRN

## 2016-01-17 MED ORDER — ALBUTEROL SULFATE (2.5 MG/3ML) 0.083% IN NEBU: 2.5000 mg | INHALATION_SOLUTION | RESPIRATORY_TRACT | 2 refills | Status: DC | PRN

## 2016-01-17 MED ORDER — BENZONATATE 200 MG PO CAPS: 200.0000 mg | ORAL_CAPSULE | Freq: Three times a day (TID) | ORAL | 0 refills | Status: DC | PRN

## 2016-01-17 MED ORDER — AMOXICILLIN-POT CLAVULANATE 875-125 MG PO TABS: 1.0000 | ORAL_TABLET | Freq: Two times a day (BID) | ORAL | 0 refills | Status: DC

## 2016-01-17 NOTE — Patient Instructions (Signed)
Aches/Pains, Fever Acetaminophen (Tylenol) 500 mg tablets - take max 2 tablets (1000 mg) every 6 hours (4 times per day)  Ibuprofen (Motrin) 200 mg tablets - take max 4 tablets (800 mg) every 6 hours - caution if high blood pressure!   Sinus Congestion Prescription Atrovent Cromolyn Nasal Spray (NasalCrom) 1 spray each nostril 3-4 times per day, max 6 imes per day Nasal Saline if desired Oxymetolazone (Afrin, others) sparing use due to rebound congestion - DO NOT GIVE TO CHILDREN Phenylephrine PE (Sudafed) 10 mg tablets every 4 hours (or the 12-hour formulation) - caution if high blood pressure  Diphenhydramine (Benadryl) 25 mg tablets - take max 2 tablets every 4 hours  Cough Prescription Tessalon Dextromethorphan (Robitussin, others) - cough suppressant Guaifenesin (Robitussin, Mucinex, others) - expectorant (helps cough up mucus) The above medications also come in a combination tablet Lozenges w/ Benzocaine + Menthol (Cepacol) Honey - as much as you want Teas which "coat the throat" - look for ingredients Elm Bark, Licorice Root, Marshmallow Root  Other Zinc Lozenges within 24 hours of symptoms onset - mixed evidence this shortens the duration of the common cold Don't waste your money on Vitamin C or Echinacea

## 2016-01-17 NOTE — Progress Notes (Signed)
HPI: Rhonda Oneal is a 50 y.o. female  who presents [facility] today, 01/17/16,  for chief complaint of:  Chief Complaint  Patient presents with  . Cough    Acute illness: . Quality:Cough, sinus pressure . Duration: 5 or 6 days . Timing: nighttime worse . Modifying factors: OTC medications - alka seltzer cold plus, robuitussin  . Assoc signs/symptoms: runny nose, facial pain both sides. Reports history of asthma, has nebulizer at home and requested albuterol refill    Past medical, surgical, social and family history reviewed: Patient Active Problem List   Diagnosis Date Noted  . History of asthma 01/17/2016  . Acute non-recurrent maxillary sinusitis 12/15/2015  . Ischial bursitis 12/15/2015  . Mid back pain 03/18/2015  . 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 Radiculitis of left cervical region 11/12/2012  . Right lumbar radiculitis 05/13/2012  . Irritable bowel syndrome 11/29/2011  . Morbid obesity (HCC)   . ALLERGIC RHINITIS, SEASONAL 06/08/2010  . EDEMA LEG 03/22/2010  . DERMATITIS, ATOPIC 02/09/2010   Past Surgical History:  Procedure Laterality Date  . CESAREAN SECTION    . TONSILLECTOMY AND ADENOIDECTOMY    . WISDOM TOOTH EXTRACTION     Social History  Substance Use Topics  . Smoking status: Never Smoker  . Smokeless tobacco: Never Used  . Alcohol use No   Family History  Problem Relation Age of Onset  . Diabetes Mother   . Cancer Mother     Lung  . Breast cancer Maternal Aunt   . Ovarian cancer Paternal Aunt   . Hyperlipidemia Father      Current medication list and allergy/intolerance information reviewed:   Current Outpatient Prescriptions on File Prior to Visit  Medication Sig Dispense Refill  . amitriptyline (ELAVIL) 50 MG tablet ONE HALF TAB PO QHS FOR A WEEK, THEN ONE TAB PO QHS. 90 tablet 3  . baclofen (LIORESAL) 10 MG tablet Take 10 mg by mouth 3 (three) times daily.    . benzonatate  (TESSALON) 200 MG capsule Take 1 capsule (200 mg total) by mouth 3 (three) times daily as needed for cough. 45 capsule 0  . gabapentin (NEURONTIN) 800 MG tablet One tab by mouth at breakfast, midday, 2 tabs at bedtime. 120 tablet 3  . meloxicam (MOBIC) 15 MG tablet Take 1 tablet (15 mg total) by mouth daily. 30 tablet 0  . montelukast (SINGULAIR) 10 MG tablet Take 10 mg by mouth at bedtime.    . Multiple Vitamin (MULTIVITAMIN) tablet Take 1 tablet by mouth daily.      Marland Kitchen. omeprazole (PRILOSEC) 20 MG capsule Take 20 mg by mouth daily.    . ondansetron (ZOFRAN) 4 MG tablet Take 1-2 tablets (4-8 mg total) by mouth every 8 (eight) hours as needed for nausea or vomiting. 30 tablet 1  . traMADol-acetaminophen (ULTRACET) 37.5-325 MG per tablet 1 or 2 every 4-6 hours as needed for moderate-severe pain 15 tablet 0   No current facility-administered medications on file prior to visit.    Allergies  Allergen Reactions  . Molds & Smuts Shortness Of Breath    Trigger for asthma   . Cymbalta [Duloxetine Hcl]     Yawning   . Ferrous Sulfate Itching      Review of Systems:  Constitutional: +recent illness  HEENT: No  headache, no vision change  Cardiac: No  chest pain, No  pressure, No palpitations  Respiratory:  No  shortness of breath. +Cough  Gastrointestinal: No  abdominal pain, no change on bowel habits  Musculoskeletal: No new myalgia/arthralgia  Skin: No  Rash   Exam:  BP (!) 142/99   Pulse 98   Temp 98.6 F (37 C) (Oral)   Ht 5\' 1"  (1.549 m)   Wt 241 lb (109.3 kg)   BMI 45.54 kg/m   Constitutional: VS see above. General Appearance: alert, well-developed, well-nourished, NAD  Eyes: Normal lids and conjunctive, non-icteric sclera  Ears, Nose, Mouth, Throat: MMM, Normal external inspection ears/nares/mouth/lips/gums.TM clear effusion bilaterally, nasal mucosa normal, no lymphadenopathy  Neck: No masses, trachea midline.   Respiratory: Normal respiratory effort. no wheeze,  no rhonchi, no rales  Cardiovascular: S1/S2 normal, no murmur, no rub/gallop auscultated. RRR.   Musculoskeletal: Gait normal. Symmetric and independent movement of all extremities  Neurological: Normal balance/coordination. No tremor.  Skin: warm, dry, intact.   Psychiatric: Normal judgment/insight. Normal mood and affect. Oriented x3.      ASSESSMENT/PLAN: For antibiotics if no improvement 7-10 days after onset of symptoms. List of OTC medications given, caution with NSAIDs and decongestants.  Viral URI with cough - Plan: ipratropium (ATROVENT) 0.03 % nasal spray, benzonatate (TESSALON) 200 MG capsule  History of asthma - Plan: albuterol (PROVENTIL) (2.5 MG/3ML) 0.083% nebulizer solution  Rhinosinusitis - Plan: amoxicillin-clavulanate (AUGMENTIN) 875-125 MG tablet  Annual physical exam - Plan: CBC with Differential/Platelet, COMPLETE METABOLIC PANEL WITH GFR, Lipid panel, TSH, VITAMIN D 25 Hydroxy (Vit-D Deficiency, Fractures)     Visit summary with medication list and pertinent instructions was printed for patient to review. All questions at time of visit were answered - patient instructed to contact office with any additional concerns. ER/RTC precautions were reviewed with the patient. Follow-up plan: Return for estbalish care visit next 2 - 3 months at your convenience. Labs before that visit if you're able.   Note: labs ordered for upcoming annual physical, preventive care visit was not performed or billed today

## 2016-01-18 ENCOUNTER — Telehealth: Payer: Self-pay

## 2016-01-18 DIAGNOSIS — R059 Cough, unspecified: Secondary | ICD-10-CM

## 2016-01-18 DIAGNOSIS — R05 Cough: Secondary | ICD-10-CM

## 2016-01-19 MED ORDER — GUAIFENESIN-CODEINE 100-10 MG/5ML PO SOLN: 5.0000 mL | ORAL | 0 refills | Status: DC | PRN

## 2016-01-19 NOTE — Telephone Encounter (Signed)
Pt.notified

## 2016-01-19 NOTE — Telephone Encounter (Signed)
You seen patient on 01/18/16 for URI.  She reports that her cough isn't any better. She is requesting something else. Please advise.

## 2016-01-19 NOTE — Telephone Encounter (Signed)
Rx printed to fax for Robitussin/Codeine

## 2016-01-20 ENCOUNTER — Ambulatory Visit: Payer: Federal, State, Local not specified - PPO | Admitting: Osteopathic Medicine

## 2016-02-17 ENCOUNTER — Other Ambulatory Visit: Payer: Self-pay | Admitting: Osteopathic Medicine

## 2016-02-17 DIAGNOSIS — J069 Acute upper respiratory infection, unspecified: Secondary | ICD-10-CM

## 2016-02-17 DIAGNOSIS — B9789 Other viral agents as the cause of diseases classified elsewhere: Principal | ICD-10-CM

## 2016-02-23 ENCOUNTER — Ambulatory Visit (INDEPENDENT_AMBULATORY_CARE_PROVIDER_SITE_OTHER): Payer: Federal, State, Local not specified - PPO | Admitting: Osteopathic Medicine

## 2016-02-23 VITALS — BP 155/89 | HR 102 | Temp 97.6°F | Wt 245.0 lb

## 2016-02-23 DIAGNOSIS — J329 Chronic sinusitis, unspecified: Secondary | ICD-10-CM | POA: Diagnosis not present

## 2016-02-23 DIAGNOSIS — B9689 Other specified bacterial agents as the cause of diseases classified elsewhere: Secondary | ICD-10-CM

## 2016-02-23 MED ORDER — AMOXICILLIN-POT CLAVULANATE 875-125 MG PO TABS: 1.0000 | ORAL_TABLET | Freq: Two times a day (BID) | ORAL | 0 refills | Status: DC

## 2016-02-23 NOTE — Progress Notes (Signed)
HPI: Rhonda Oneal is a 51 y.o. female who [facility] [place] 02/23/16 for chief complaint of:  Chief Complaint  Patient presents with  . Sinus Problem    Acute Illness: . Quality: facial pain w/ recent URI . Assoc signs/symptoms: see ROS, R neck pain . Duration: 10 days . Modifying factors: has tried the following OTC/Rx medications: Mucinex, Alka Seltzer Cold, Ibuprofen   Elevated BP:  Borderline or high BP past few visits and occasionally previously to that. No CP/SOB.   Past medical, social and family history reviewed. Current medications and allergies reviewed.     Review of Systems:  Constitutional: No  fever/chills  HEENT: Yes  headache, No  sore throat, No  swollen glands  Cardiovascular: No chest pain  Respiratory:Yes  cough, No  shortness of breath  Gastrointestinal: Yes nausea, No  vomiting,  No  diarrhea  Musculoskeletal:   Yes  myalgia/arthralgia  Skin/Integument:  No  rash   Exam:  BP (!) 155/89   Pulse (!) 102   Temp 97.6 F (36.4 C)   Wt 245 lb (111.1 kg)   BMI 46.29 kg/m   Constitutional: VSS, see above. General Appearance: alert, well-developed, well-nourished, NAD  Eyes: Normal lids and conjunctive, non-icteric sclera, PERRLA  Ears, Nose, Mouth, Throat: Normal external inspection ears/nares/mouth/lips/gums, normal TM, MMM; posterior pharynx without erythema, without exudate, nasal mucosa normal  Neck: No masses, trachea midline. normal lymph nodes  Respiratory: Normal respiratory effort. No  wheeze/rhonchi/rales  Cardiovascular: S1/S2 normal, no murmur/rub/gallop auscultated. RRR.  Msk: normal ROM neck, no mass/tenderness    ASSESSMENT/PLAN:  Bacterial sinusitis - Plan: amoxicillin-clavulanate (AUGMENTIN) 875-125 MG tablet     Patient Instructions  Trial antibiotics again, let me know if no better by 5 days on the antibiotics and we will try alternative. Will likely need to get CT scan  of the sinuses for further evaluation since it seems you are requiring an awful lot of antibiotics for multiple episodes of extended duration of sinus symptoms in the course of 6 months.   In the future, if you start to feel sinus congestion/allergies/viral respiratory infection type symptoms, please be sure that you're using the Atrovent nasal spray, antihistamine such as Benadryl or Claritin, anti-inflammatories such as Tylenol, can try zinc lozenges as well if within first 24 hours of symptom onset.    Visit summary was printed for the patient with medications and pertinent instructions for patient to review. ER/RTC precautions reviewed. All questions answered. Return if symptoms worsen or fail to improve, and to establish care with PCP for chronic medical issues.

## 2016-02-23 NOTE — Patient Instructions (Signed)
Trial antibiotics again, let me know if no better by 5 days on the antibiotics and we will try alternative. Will likely need to get CT scan of the sinuses for further evaluation since it seems you are requiring an awful lot of antibiotics for multiple episodes of extended duration of sinus symptoms in the course of 6 months.   In the future, if you start to feel sinus congestion/allergies/viral respiratory infection type symptoms, please be sure that you're using the Atrovent nasal spray, antihistamine such as Benadryl or Claritin, anti-inflammatories such as Tylenol, can try zinc lozenges as well if within first 24 hours of symptom onset.

## 2016-03-06 ENCOUNTER — Telehealth: Payer: Self-pay

## 2016-03-06 MED ORDER — DOXYCYCLINE HYCLATE 100 MG PO TABS: 100.0000 mg | ORAL_TABLET | Freq: Two times a day (BID) | ORAL | 0 refills | Status: DC

## 2016-03-06 NOTE — Telephone Encounter (Signed)
Patient advised of recommendations.  

## 2016-03-06 NOTE — Telephone Encounter (Signed)
Rhonda Oneal called and reports she is not doing any better. She is still having the neck pain. She states this has happened in the past and Dr Ivan AnchorsHommel switched her antibiotic to doxycycline. She reports no other symptoms. Please advise.   There was a note on 12/2011

## 2016-03-06 NOTE — Telephone Encounter (Signed)
Antibiotic was sent, if patient is not experiencing improvement she will need to get CT of the sinuses

## 2016-03-16 ENCOUNTER — Encounter: Payer: Self-pay | Admitting: Family Medicine

## 2016-03-16 ENCOUNTER — Ambulatory Visit (INDEPENDENT_AMBULATORY_CARE_PROVIDER_SITE_OTHER): Payer: Federal, State, Local not specified - PPO | Admitting: Family Medicine

## 2016-03-16 DIAGNOSIS — M7061 Trochanteric bursitis, right hip: Secondary | ICD-10-CM

## 2016-03-16 DIAGNOSIS — S39012A Strain of muscle, fascia and tendon of lower back, initial encounter: Secondary | ICD-10-CM

## 2016-03-16 DIAGNOSIS — M7062 Trochanteric bursitis, left hip: Secondary | ICD-10-CM

## 2016-03-16 NOTE — Patient Instructions (Signed)
Thank you for coming in today. Attend PT.  Recheck in 4-6 weeks with myself or Dr T.   Come back or go to the emergency room if you notice new weakness new numbness problems walking or bowel or bladder problems.  TENS UNIT: This is helpful for muscle pain and spasm.   Search and Purchase a TENS 7000 2nd edition at www.tenspros.com. It should be less than $30.     TENS unit instructions: Do not shower or bathe with the unit on Turn the unit off before removing electrodes or batteries If the electrodes lose stickiness add a drop of water to the electrodes after they are disconnected from the unit and place on plastic sheet. If you continued to have difficulty, call the TENS unit company to purchase more electrodes. Do not apply lotion on the skin area prior to use. Make sure the skin is clean and dry as this will help prolong the life of the electrodes. After use, always check skin for unusual red areas, rash or other skin difficulties. If there are any skin problems, does not apply electrodes to the same area. Never remove the electrodes from the unit by pulling the wires. Do not use the TENS unit or electrodes other than as directed. Do not change electrode placement without consultating your therapist or physician. Keep 2 fingers with between each electrode. Wear time ratio is 2:1, on to off times.    For example on for 30 minutes off for 15 minutes and then on for 30 minutes off for 15 minutes    Lumbosacral Strain Lumbosacral strain is an injury that causes pain in the lower back (lumbosacral spine). This injury usually occurs from overstretching the muscles or ligaments along your spine. A strain can affect one or more muscles or cord-like tissues that connect bones to other bones (ligaments). What are the causes? This condition may be caused by:  A hard, direct hit (blow) to the back.  Excessive stretching of the lower back muscles. This may result from:  A fall.  Lifting  something heavy.  Repetitive movements such as bending or crouching. What increases the risk? The following factors may increase your risk of getting this condition:  Participating in sports or activities that involve:  A sudden twist of the back.  Pushing or pulling motions.  Being overweight or obese.  Having poor strength and flexibility, especially tight hamstrings or weak muscles in the back or abdomen.  Having too much of a curve in the lower back.  Having a pelvis that is tilted forward. What are the signs or symptoms? The main symptom of this condition is pain in the lower back, at the site of the strain. Pain may extend (radiate) down one or both legs. How is this diagnosed? This condition is diagnosed based on:  Your symptoms.  Your medical history.  A physical exam.  Your health care provider may push on certain areas of your back to determine the source of your pain.  You may be asked to bend forward, backward, and side to side to assess the severity of your pain and your range of motion.  Imaging tests, such as:  X-rays.  MRI. How is this treated? Treatment for this condition may include:  Putting heat and cold on the affected area.  Medicines to help relieve pain and relax your muscles (muscle relaxants).  NSAIDs to help reduce swelling and discomfort. When your symptoms improve, it is important to gradually return to your normal routine  as soon as possible to reduce pain, avoid stiffness, and avoid loss of muscle strength. Generally, symptoms should improve within 6 weeks of treatment. However, recovery time varies. Follow these instructions at home: Managing pain, stiffness, and swelling  If directed, put ice on the injured area during the first 24 hours after your strain.  Put ice in a plastic bag.  Place a towel between your skin and the bag.  Leave the ice on for 20 minutes, 2-3 times a day.  If directed, put heat on the affected area as  often as told by your health care provider. Use the heat source that your health care provider recommends, such as a moist heat pack or a heating pad.  Place a towel between your skin and the heat source.  Leave the heat on for 20-30 minutes.  Remove the heat if your skin turns bright red. This is especially important if you are unable to feel pain, heat, or cold. You may have a greater risk of getting burned. Activity  Rest and return to your normal activities as told by your health care provider. Ask your health care provider what activities are safe for you.  Avoid activities that take a lot of energy for as long as told by your health care provider. General instructions  Take over-the-counter and prescription medicines only as told by your health care provider.  Donot drive or use heavy machinery while taking prescription pain medicine.  Do not use any products that contain nicotine or tobacco, such as cigarettes and e-cigarettes. If you need help quitting, ask your health care provider.  Keep all follow-up visits as told by your health care provider. This is important. How is this prevented?  Use correct form when playing sports and lifting heavy objects.  Use good posture when sitting and standing.  Maintain a healthy weight.  Sleep on a mattress with medium firmness to support your back.  Be safe and responsible while being active to avoid falls.  Do at least 150 minutes of moderate-intensity exercise each week, such as brisk walking or water aerobics. Try a form of exercise that takes stress off your back, such as swimming or stationary cycling.  Maintain physical fitness, including:  Strength.  Flexibility.  Cardiovascular fitness.  Endurance. Contact a health care provider if:  Your back pain does not improve after 6 weeks of treatment.  Your symptoms get worse. Get help right away if:  Your back pain is severe.  You cannot stand or walk.  You have  difficulty controlling when you urinate or when you have a bowel movement.  You feel nauseous or you vomit.  Your feet get very cold.  You have numbness, tingling, weakness, or problems using your arms or legs.  You develop any of the following:  Shortness of breath.  Dizziness.  Pain in your legs.  Weakness in your buttocks or legs.  Discoloration of the skin on your toes or legs. This information is not intended to replace advice given to you by your health care provider. Make sure you discuss any questions you have with your health care provider. Document Released: 11/15/2004 Document Revised: 08/26/2015 Document Reviewed: 07/10/2015 Elsevier Interactive Patient Education  2017 [employer]

## 2016-03-16 NOTE — Progress Notes (Signed)
Rhonda Oneal is a 51 y.o. female who presents to [facility] today for back pain. Patient notes a two-week history of left-sided low back pain. She denies any radiating pain weakness or numbness bowel bladder dysfunction. She notes the pain is worse with activity and lifting and somewhat better with rest. She's tried heat home stretching tramadol meloxicam and topical diclofenac. These treatments have helped a little bit.   Past Medical History:  Diagnosis Date  . Arthritis   . Asthma   . DERMATITIS, ATOPIC 02/09/2010   Qualifier: Diagnosis of  By: Orson Aloe MD, Tinnie Gens    . EDEMA LEG 03/22/2010   Qualifier: Diagnosis of  By: Orson Aloe MD, Tinnie Gens    . Fibromyalgia   . IBS (irritable bowel syndrome)   . Morbid obesity (HCC)    Past Surgical History:  Procedure Laterality Date  . CESAREAN SECTION    . TONSILLECTOMY AND ADENOIDECTOMY    . WISDOM TOOTH EXTRACTION     Social History  Substance Use Topics  . Smoking status: Never Smoker  . Smokeless tobacco: Never Used  . Alcohol use No     ROS:  As above   Medications: Current Outpatient Prescriptions  Medication Sig Dispense Refill  . albuterol (PROVENTIL) (2.5 MG/3ML) 0.083% nebulizer solution Take 3 mLs (2.5 mg total) by nebulization every 4 (four) hours as needed for wheezing or shortness of breath. 30 vial 2  . amitriptyline (ELAVIL) 50 MG tablet ONE HALF TAB PO QHS FOR A WEEK, THEN ONE TAB PO QHS. 90 tablet 3  . amoxicillin-clavulanate (AUGMENTIN) 875-125 MG tablet Take 1 tablet by mouth 2 (two) times daily. Call office if no better by 02/28/16 14 tablet 0  . baclofen (LIORESAL) 10 MG tablet Take 10 mg by mouth 3 (three) times daily.    . CRYSELLE-28 0.3-30 MG-MCG tablet TAKE 1 TABLET BY MOUTH DAILY. 28 tablet 5  . CRYSELLE-28 0.3-30 MG-MCG tablet TAKE 1 TABLET BY MOUTH DAILY. 28 tablet 6  . doxycycline (VIBRA-TABS) 100 MG tablet Take 1 tablet (100 mg total) by mouth 2 (two)  times daily. 10 tablet 0  . gabapentin (NEURONTIN) 800 MG tablet One tab by mouth at breakfast, midday, 2 tabs at bedtime. 120 tablet 3  . ipratropium (ATROVENT) 0.03 % nasal spray Place 2 sprays into both nostrils 3 (three) times daily as needed for rhinitis (nasal congestion). APPOINTMENT NEEDED FOR FURTHER REFILLS 30 mL 0  . meloxicam (MOBIC) 15 MG tablet Take 1 tablet (15 mg total) by mouth daily. 30 tablet 0  . montelukast (SINGULAIR) 10 MG tablet Take 10 mg by mouth at bedtime.    . Multiple Vitamin (MULTIVITAMIN) tablet Take 1 tablet by mouth daily.      Marland Kitchen omeprazole (PRILOSEC) 20 MG capsule Take 20 mg by mouth daily.    . ondansetron (ZOFRAN) 4 MG tablet Take 1-2 tablets (4-8 mg total) by mouth every 8 (eight) hours as needed for nausea or vomiting. 30 tablet 1  . traMADol-acetaminophen (ULTRACET) 37.5-325 MG per tablet 1 or 2 every 4-6 hours as needed for moderate-severe pain 15 tablet 0   No current facility-administered medications for this visit.    Allergies  Allergen Reactions  . Molds & Smuts Shortness Of Breath    Trigger for asthma   . Cymbalta [Duloxetine Hcl]     Yawning   . Ferrous Sulfate Itching     Exam:  BP 115/77   Pulse 99   Wt 245 lb (  111.1 kg)   BMI 46.29 kg/m  General: Well Developed, well nourished, and in no acute distress.  Neuro/Psych: Alert and oriented x3, extra-ocular muscles intact, able to move all 4 extremities, sensation grossly intact. Skin: Warm and dry, no rashes noted.  Respiratory: Not using accessory muscles, speaking in full sentences, trachea midline.  Cardiovascular: Pulses palpable, no extremity edema. Abdomen: Does not appear distended. MSK: Back: Nontender to spinal midline. Tender palpation left SI joint and left lumbar paraspinal muscle group. We'll back motion. Negative slump test. Positive Faber test bilaterally. Intact lower extremity strength. Patient is sooner heels and toes and squat. However hip abduction strength is  diminished bilaterally.  Hip exam: Normal-appearing. Tender to palpation bilateral lateral hips. As noted above hip abduction strength is 4/5 bilaterally. Normal hip Motion.    No results found for this or any previous visit (from the past 48 hour(s)). No results found.    Assessment and Plan: 51 y.o. female with  Lumbosacral strain with trochanteric bursitis. I think functionally patient has a weak core and hip girdle muscle group likely due to deconditioning. An for a trial of physical therapy and recheck in 4-6 weeks with myself or Dr. Benjamin Stainhekkekandam    Orders Placed This Encounter  Procedures  . Ambulatory referral to Physical Therapy    Referral Priority:   Routine    Referral Type:   Physical Medicine    Referral Reason:   Specialty Services Required    Requested Specialty:   Physical Therapy    Number of Visits Requested:   1    Discussed warning signs or symptoms. Please see discharge instructions. Patient expresses understanding.

## 2016-03-27 ENCOUNTER — Encounter: Payer: Self-pay | Admitting: Rehabilitative and Restorative Service Providers"

## 2016-03-27 ENCOUNTER — Ambulatory Visit (INDEPENDENT_AMBULATORY_CARE_PROVIDER_SITE_OTHER): Payer: Federal, State, Local not specified - PPO | Admitting: Rehabilitative and Restorative Service Providers"

## 2016-03-27 DIAGNOSIS — M545 Low back pain, unspecified: Secondary | ICD-10-CM

## 2016-03-27 DIAGNOSIS — M25551 Pain in right hip: Secondary | ICD-10-CM | POA: Diagnosis not present

## 2016-03-27 DIAGNOSIS — M25552 Pain in left hip: Secondary | ICD-10-CM

## 2016-03-27 DIAGNOSIS — R29898 Other symptoms and signs involving the musculoskeletal system: Secondary | ICD-10-CM | POA: Diagnosis not present

## 2016-03-27 NOTE — Patient Instructions (Signed)
Abdominal Bracing With Pelvic Floor (Hook-Lying)    With neutral spine, tighten pelvic floor and abdominals sucking belly button to back bone, tighten muscles in low back at waist. Hold 10 sec  Repeat _10__ times. Do _several__ times a day. Progress to do this in sitting standing walking and with functional activities   Trunk: Prone Extension (Press-Ups)    Lie on stomach on firm, flat surface. Relax bottom and legs. Raise chest in air with elbows straight. Keep hips flat on surface, sag stomach. Hold __2-3__ seconds. Repeat _10___ times. Do _2___ sessions per day. CAUTION: Movement should be gentle and slow.   Piriformis Stretch   Lying on back, pull right knee toward opposite shoulder. Hold 30 seconds. Repeat 3 times. Do 2-3 sessions per day.   TENS UNIT: This is helpful for muscle pain and spasm.   Search and Purchase a TENS 7000 2nd edition at www.tenspros.com. It should be less than $30.     TENS unit instructions: Do not shower or bathe with the unit on Turn the unit off before removing electrodes or batteries If the electrodes lose stickiness add a drop of water to the electrodes after they are disconnected from the unit and place on plastic sheet. If you continued to have difficulty, call the TENS unit company to purchase more electrodes. Do not apply lotion on the skin area prior to use. Make sure the skin is clean and dry as this will help prolong the life of the electrodes. After use, always check skin for unusual red areas, rash or other skin difficulties. If there are any skin problems, does not apply electrodes to the same area. Never remove the electrodes from the unit by pulling the wires. Do not use the TENS unit or electrodes other than as directed. Do not change electrode placement without consultating your therapist or physician. Keep 2 fingers with between each electrode.

## 2016-03-27 NOTE — Therapy (Addendum)
[facility] [place] [address] [place], [place], [address] Phone: [phone]   Fax:  [phone]  Physical Therapy Evaluation  Patient Details  Name: Rhonda Oneal MRN: [id number] Date of Birth: [birth year] Referring Provider: Dr Denyse Amass  Encounter Date: 03/27/2016      PT End of Session - 03/27/16 1113    Visit Number 1   Number of Visits 12   Date for PT Re-Evaluation 05/08/16   PT Start Time 1025   PT Stop Time 1121   PT Time Calculation (min) 56 min   Activity Tolerance Patient tolerated treatment well      Past Medical History:  Diagnosis Date  . Arthritis   . Asthma   . DERMATITIS, ATOPIC 02/09/2010   Qualifier: Diagnosis of  By: Orson Aloe MD, Tinnie Gens    . EDEMA LEG 03/22/2010   Qualifier: Diagnosis of  By: Orson Aloe MD, Tinnie Gens    . Fibromyalgia   . IBS (irritable bowel syndrome)   . Morbid obesity (HCC)     Past Surgical History:  Procedure Laterality Date  . CESAREAN SECTION    . TONSILLECTOMY AND ADENOIDECTOMY    . WISDOM TOOTH EXTRACTION      There were no vitals filed for this visit.       Subjective Assessment - 03/27/16 1029    Subjective Patient reports that she has pain in Rt and Lt hips to mid back which has been present for the past two months with no known injury.    Pertinent History Fibromyalgia; HNP lumbar spine; back pain intermittently since 2001    How long can you sit comfortably? 30 min    How long can you stand comfortably? 15-20 min    How long can you walk comfortably? 30 min    Diagnostic tests xrays    Patient Stated Goals get rid of pain    Currently in Pain? Yes   Pain Score 5    Pain Location Back   Pain Orientation Right;Left;Lower;Mid   Pain Descriptors / Indicators Aching   Pain Type Acute pain;Chronic pain   Pain Radiating Towards radiates into bilat hips    Pain Onset More than a month ago   Pain Frequency Intermittent   Aggravating Factors  walking; bending;  stairs; lifting; prolonged positions    Pain Relieving Factors heat; rest             [facility] PT Assessment - 03/27/16 0001      Assessment   Medical Diagnosis Limbosacral strain , bilat hip bursitis   Referring Provider Dr Denyse Amass   Onset Date/Surgical Date 02/03/16   Hand Dominance Right   Next MD Visit PRN   Prior Therapy 2014      Precautions   Precautions None     Balance Screen   Has the patient fallen in the past 6 months No   Has the patient had a decrease in activity level because of a fear of falling?  No   Is the patient reluctant to leave their home because of a fear of falling?  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; sedentary      Observation/Other Assessments   Focus on Therapeutic Outcomes (FOTO)  53% limitation      Sensation   Additional Comments  WFL's per pt report      Posture/Postural Control   Posture Comments head forward shoulders rounded and elevated; increased thoracic kyphosis     AROM   Overall AROM Comments LE mobility limited by obesity    AROM Assessment Site --  discomfort and pulling with all trunk motions   Lumbar Flexion 80%   Lumbar Extension 65%   Lumbar - Right Side Bend 65%   Lumbar - Left Side Bend 65%   Lumbar - Right Rotation 50%   Lumbar - Left Rotation 505     Strength   Right Hip Flexion 4+/5   Right Hip Extension 4/5   Right Hip ABduction 4-/5   Right Hip ADduction 4+/5   Left Hip Flexion 4+/5   Left Hip Extension 4/5   Left Hip ABduction 4-/5   Left Hip ADduction 4+/5     Flexibility   Hamstrings tight Rt ~ 85 deg; Lt ~ 75 deg   Quadriceps WFL's bilat    ITB tight bilat    Piriformis tight bilat Rt > Lt      Palpation   Spinal mobility discomfort with CPA and UPA mobs L4    Palpation comment tender bilat hips at greater trochanter;  piriformis/hip abductors                    [facility] Adult PT Treatment/Exercise - 03/27/16 0001      Lumbar Exercises: Stretches   Press Ups --  2-3 sec hold x 10 to pt limit   Piriformis Stretch 3 reps;30 seconds  w/strap diagonal knee across body      Lumbar Exercises: Supine   Ab Set --  3 part core 10 sec x 10      Moist Heat Therapy   Number Minutes Moist Heat 20 Minutes   Moist Heat Location Hip  bilat      Electrical Stimulation   Electrical Stimulation Location bilat hips    Electrical Stimulation Action premod   Electrical Stimulation Parameters to tolerance   Electrical Stimulation Goals Pain;Tone                PT Education - 03/27/16 1052    Education provided Yes   Education Details HEP TENS    Person(s) Educated Patient   Methods Explanation;Demonstration;Tactile cues;Verbal cues;Handout   Comprehension Verbalized understanding;Returned demonstration;Verbal cues required;Tactile cues required             PT Long Term Goals - 03/27/16 1117      PT LONG TERM GOAL #1   Title Improve strength bilat LE's to 4+/5 to 5/5 05/08/16   Time 6   Period Weeks   Status New     PT LONG TERM GOAL #2   Title Increase sitting and walking tolerance to 45-60 min and standing tolerance to 15-30 min 05/08/16   Time 6   Period Weeks   Status New     PT LONG TERM GOAL #3   Title Increase activity level with patient reporting exercise of some type 20-30 min at least 3-4 times per week 05/08/16   Time 6   Period Weeks   Status New     PT LONG TERM GOAL #4   Title Independent in HEP 04/28/16   Time 6   Period Weeks   Status New     PT LONG TERM GOAL #5   Title Improve FOTO to </= 40% limitation 05/08/16   Time 6   Period Weeks  Status New               Plan - 03/27/16 1114    Clinical Impression Statement Patient presents with low back and bilat hip pain which has been present for ~ 2 months. She has a history of back paiin present  since 2001 as well as a history of fibromyalgia. Patient is sedentary. She has poor posture and alignment; poor core stability; decreased strength bilat LE's; limited trunk and LE mobility; decresed activity level; pain. Patient will benefit from PT to address problems identified.    Rehab Potential Good   PT Frequency 2x / week   PT Duration 6 weeks   PT Treatment/Interventions Patient/family education;ADLs/Self Care Home Management;Cryotherapy;Electrical Stimulation;Iontophoresis '4mg'$ /ml Dexamethasone;Moist Heat;Traction;Ultrasound;Dry needling;Manual techniques;Therapeutic activities;Therapeutic exercise   PT Next Visit Plan progress with stretching; core stabilization; strengthening; aerobic exercise program    Consulted and Agree with Plan of Care Patient      Patient will benefit from skilled therapeutic intervention in order to improve the following deficits and impairments:  Postural dysfunction, Improper body mechanics, Pain, Decreased range of motion, Decreased mobility, Decreased strength, Decreased activity tolerance  Visit Diagnosis: Acute midline low back pain without sciatica - Plan: PT plan of care cert/re-cert  Pain in left hip - Plan: PT plan of care cert/re-cert  Pain in right hip - Plan: PT plan of care cert/re-cert  Other symptoms and signs involving the musculoskeletal system - Plan: PT plan of care cert/re-cert     Problem List Patient Active Problem List   Diagnosis Date Noted  . Lumbosacral strain 03/16/2016  . Trochanteric bursitis of both hips 03/16/2016  . History of asthma 01/17/2016  . Acute non-recurrent maxillary sinusitis 12/15/2015  . Ischial bursitis 12/15/2015  . Mid back pain 03/18/2015  . 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 Radiculitis of left cervical region 11/12/2012  . Irritable bowel syndrome 11/29/2011  . Morbid obesity ([place])   . ALLERGIC RHINITIS, SEASONAL 06/08/2010  . EDEMA LEG 03/22/2010  . DERMATITIS, ATOPIC  02/09/2010    Kessler Kopinski Nilda Simmer PT, MPH  03/27/2016, 11:28 AM  [facility] [address] [place], [place], [address] Phone: [phone]   Fax:  [phone]  Name: ALURA OLVEDA MRN: [id number] Date of Birth: 1965/11/29  PHYSICAL THERAPY DISCHARGE SUMMARY  Visits from Start of Care: Eval only  Current functional level related to goals / functional outcomes: See last progress note for discharge status   Remaining deficits: Unknown    Education / Equipment: HEP Plan: Patient agrees to discharge.  Patient goals were not met. Patient is being discharged due to not returning since the last visit.  ?????     Myla Mauriello P. Helene Kelp PT, MPH 05/04/16 3:55 PM

## 2016-03-29 ENCOUNTER — Encounter: Payer: Federal, State, Local not specified - PPO | Admitting: Rehabilitative and Restorative Service Providers"

## 2016-04-02 ENCOUNTER — Encounter: Payer: Federal, State, Local not specified - PPO | Admitting: Rehabilitative and Restorative Service Providers"

## 2016-04-05 ENCOUNTER — Encounter: Payer: Federal, State, Local not specified - PPO | Admitting: Rehabilitative and Restorative Service Providers"

## 2016-04-08 ENCOUNTER — Encounter: Payer: Self-pay | Admitting: Emergency Medicine

## 2016-04-08 ENCOUNTER — Emergency Department
Admission: EM | Admit: 2016-04-08 | Discharge: 2016-04-08 | Disposition: A | Discharge status: Home / Self Care | Payer: Federal, State, Local not specified - PPO | Source: Home / Self Care | Attending: Family Medicine | Admitting: Family Medicine

## 2016-04-08 DIAGNOSIS — N750 Cyst of Bartholin's gland: Secondary | ICD-10-CM | POA: Diagnosis not present

## 2016-04-08 MED ORDER — DOXYCYCLINE HYCLATE 100 MG PO CAPS: 100.0000 mg | ORAL_CAPSULE | Freq: Two times a day (BID) | ORAL | 0 refills | Status: DC

## 2016-04-08 NOTE — Discharge Instructions (Signed)
Apply warm, wet compresses to the area or take warm, shallow baths that cover your pelvic region (sitz baths) several times a day or as directed by your health care provider.

## 2016-04-08 NOTE — ED Triage Notes (Signed)
Pt c/o boil/abscess on the right lip of the vagina x 3 days, no drainage.

## 2016-04-08 NOTE — ED Provider Notes (Signed)
[name]    CSN: [id number] Arrival date & time: 04/08/16  1722     History   Chief Complaint Chief Complaint  Patient presents with  . Abscess    HPI Rhonda Oneal is a 51 y.o. female.   Patient complains of 3 day history of pain/swelling in her right labia and is concerned that she may have an abscess.    Abscess  Abscess location: right labia. Abscess quality: induration and painful   Abscess quality: not draining, no fluctuance, no itching, no redness, no warmth and not weeping   Red streaking: no   Duration:  3 days Progression:  Unchanged Pain details:    Quality:  Aching   Severity:  Mild   Duration:  3 days   Timing:  Constant   Progression:  Unchanged Chronicity:  New Context: not skin injury   Relieved by:  Nothing Exacerbated by: sitting. Ineffective treatments:  Warm water soaks Associated symptoms: no anorexia, no fatigue and no fever     Past Medical History:  Diagnosis Date  . Arthritis   . Asthma   . DERMATITIS, ATOPIC 02/09/2010   Qualifier: Diagnosis of  By: Orson Aloe MD, Tinnie Gens    . EDEMA LEG 03/22/2010   Qualifier: Diagnosis of  By: Orson Aloe MD, Tinnie Gens    . Fibromyalgia   . IBS (irritable bowel syndrome)   . Morbid obesity [facility])     Patient Active Problem List   Diagnosis Date Noted  . Lumbosacral strain 03/16/2016  . Trochanteric bursitis of both hips 03/16/2016  . History of asthma 01/17/2016  . Acute non-recurrent maxillary sinusitis 12/15/2015  . Ischial bursitis 12/15/2015  . Mid back pain 03/18/2015  . 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 Radiculitis of left cervical region 11/12/2012  . Irritable bowel syndrome 11/29/2011  . Morbid obesity (HCC)   . ALLERGIC RHINITIS, SEASONAL 06/08/2010  . EDEMA LEG 03/22/2010  . DERMATITIS, ATOPIC 02/09/2010    Past Surgical History:  Procedure Laterality Date  . CESAREAN SECTION    . TONSILLECTOMY AND ADENOIDECTOMY    . WISDOM TOOTH EXTRACTION       OB History    Gravida Para Term Preterm AB Living   2 2           SAB TAB Ectopic Multiple Live Births                   Home Medications    Prior to Admission medications   Medication Sig Start Date End Date Taking? Authorizing Provider  cyclobenzaprine (FLEXERIL) 10 MG tablet Take 10 mg by mouth 3 (three) times daily as needed for muscle spasms.   Yes Historical Provider, MD  traMADol (ULTRAM) 50 MG tablet Take by mouth every 6 (six) hours as needed.   Yes Historical Provider, MD  doxycycline (VIBRAMYCIN) 100 MG capsule Take 1 capsule (100 mg total) by mouth 2 (two) times daily. Take with food. 04/08/16   Lattie Haw, MD  gabapentin (NEURONTIN) 800 MG tablet One tab by mouth at breakfast, midday, 2 tabs at bedtime. 07/06/15   Laren Boom, DO  meloxicam (MOBIC) 15 MG tablet Take 1 tablet (15 mg total) by mouth daily. 01/02/16   Agapito Games, MD    Family History Family History  Problem Relation Age of Onset  . Diabetes Mother   . Cancer Mother     Lung  . Hyperlipidemia Father   . Breast cancer Maternal Aunt   .  Ovarian cancer Paternal Aunt     Social History Social History  Substance Use Topics  . Smoking status: Never Smoker  . Smokeless tobacco: Never Used  . Alcohol use No     Allergies   Molds & smuts; Cymbalta [duloxetine hcl]; and Ferrous sulfate   Review of Systems Review of Systems  Constitutional: Negative for fatigue and fever.  Gastrointestinal: Negative for anorexia.  All other systems reviewed and are negative.    Physical Exam Triage Vital Signs ED Triage Vitals  Enc Vitals Group     BP 04/08/16 1747 138/90     Pulse Rate 04/08/16 1747 120     Resp --      Temp 04/08/16 1747 98.7 F (37.1 C)     Temp Source 04/08/16 1747 Oral     SpO2 04/08/16 1747 100 %     Weight 04/08/16 1748 246 lb 8 oz (111.8 kg)     Height 04/08/16 1748 5\' 2"  (1.575 m)     Head Circumference --      Peak Flow --      Pain Score 04/08/16 1751 7      Pain Loc --      Pain Edu? --      Excl. in GC? --    No data found.   Updated Vital Signs BP 138/90 (BP Location: Left Arm)   Pulse 120   Temp 98.7 F (37.1 C) (Oral)   Ht 5\' 2"  (1.575 m)   Wt 246 lb 8 oz (111.8 kg)   LMP 04/04/2016 (Exact Date)   SpO2 100%   BMI 45.09 kg/m   Visual Acuity Right Eye Distance:   Left Eye Distance:   Bilateral Distance:    Right Eye Near:   Left Eye Near:    Bilateral Near:     Physical Exam  Constitutional: She appears well-developed and well-nourished. No distress.  HENT:  Head: Normocephalic.  Left Ear: External ear normal.  Mouth/Throat: Oropharynx is clear and moist.  Eyes: Pupils are equal, round, and reactive to light.  Cardiovascular: Normal rate.   Pulmonary/Chest: Effort normal.  Genitourinary:     Genitourinary Comments: Inferior pole of right labia majora indurated and mildly tender but not fluctuant or erythematous, and minimally swollen.  Musculoskeletal: She exhibits no edema.  Skin: Skin is warm and dry.  Nursing note and vitals reviewed.    UC Treatments / Results  Labs (all labs ordered are listed, but only abnormal results are displayed) Labs Reviewed - No data to display  EKG  EKG Interpretation None       Radiology No results found.  Procedures Procedures (including critical care time)  Medications Ordered in UC Medications - No data to display   Initial Impression / Assessment and Plan / UC Course  I have reviewed the triage vital signs and the nursing notes.  Pertinent labs & imaging results that were available during my care of the patient were reviewed by me and considered in my medical decision making (see chart for details).    No evidence abscess. Begin doxycycline 100mg  BID Apply warm, wet compresses to the area or take warm, shallow baths that cover your pelvic region (sitz baths) several times a day or as directed by your health care provider. Followup with GYN if not  improved one week.  Final Clinical Impressions(s) / UC Diagnoses   Final diagnoses:  Bartholin cyst    New Prescriptions New Prescriptions   DOXYCYCLINE (VIBRAMYCIN) 100 MG CAPSULE  Take 1 capsule (100 mg total) by mouth 2 (two) times daily. Take with food.     Lattie HawStephen A Beese, MD 04/16/16 [phone]

## 2016-04-09 ENCOUNTER — Encounter: Payer: Federal, State, Local not specified - PPO | Admitting: Rehabilitative and Restorative Service Providers"

## 2016-04-10 ENCOUNTER — Telehealth: Payer: Self-pay | Admitting: *Deleted

## 2016-04-10 MED ORDER — CLINDAMYCIN HCL 300 MG PO CAPS
300.0000 mg | ORAL_CAPSULE | Freq: Three times a day (TID) | ORAL | 0 refills | Status: DC
Start: 2016-04-10 — End: 2016-09-14

## 2016-04-10 NOTE — Telephone Encounter (Signed)
Patient advised per Denny PeonErin, to stop doxy and start clindamycin. She escribed to [facility] [address]. Encouraged f/u with gyn.

## 2016-04-10 NOTE — Telephone Encounter (Signed)
Patient was prescribed doxycycline by Dr. Cathren HarshBeese. She reports severe HA every time she takes it and would like a different antibiotic.

## 2016-04-12 ENCOUNTER — Encounter: Payer: Federal, State, Local not specified - PPO | Admitting: Physical Therapy

## 2016-04-18 ENCOUNTER — Ambulatory Visit (INDEPENDENT_AMBULATORY_CARE_PROVIDER_SITE_OTHER): Payer: Federal, State, Local not specified - PPO

## 2016-04-18 ENCOUNTER — Encounter: Payer: Self-pay | Admitting: Sports Medicine

## 2016-04-18 ENCOUNTER — Ambulatory Visit (INDEPENDENT_AMBULATORY_CARE_PROVIDER_SITE_OTHER): Payer: Federal, State, Local not specified - PPO | Admitting: Sports Medicine

## 2016-04-18 VITALS — BP 137/85 | HR 116 | Ht 62.0 in | Wt 246.1 lb

## 2016-04-18 DIAGNOSIS — Z23 Encounter for immunization: Secondary | ICD-10-CM | POA: Diagnosis not present

## 2016-04-18 DIAGNOSIS — M533 Sacrococcygeal disorders, not elsewhere classified: Secondary | ICD-10-CM

## 2016-04-18 NOTE — Assessment & Plan Note (Signed)
Patient desires interventional treatment today. Left SI joint injection under ultrasound guidance. Physical therapy, diclofenac.  SI joint x-rays  Return to see me in one month.

## 2016-04-18 NOTE — Progress Notes (Signed)
Subjective:    I'm seeing this patient as a consultation for:  Primus Bravoiffany Gibson, NP  CC: Left sided low back pain  HPI: This is a pleasant 51 year old female, I treated her in the past for right-sided lumbar radiculopathy, she did respond well back in 2015 to an L4-L5 and L5-S1 transforaminal epidural, continues to be pain-free on the side. Unfortunately for the past several months she's developed new pain when walking, that she localizes directly over the left sacroiliac joint without radiation, not worse with sitting, flexion, Valsalva, nothing radicular, no bowel or bladder dysfunction, no saddle numbness, no constitutional symptoms.  Past medical history:  Negative.  See flowsheet/record as well for more information.  Surgical history: Negative.  See flowsheet/record as well for more information.  Family history: Negative.  See flowsheet/record as well for more information.  Social history: Negative.  See flowsheet/record as well for more information.  Allergies, and medications have been entered into the medical record, reviewed, and no changes needed.   Review of Systems: No headache, visual changes, nausea, vomiting, diarrhea, constipation, dizziness, abdominal pain, skin rash, fevers, chills, night sweats, weight loss, swollen lymph nodes, body aches, joint swelling, muscle aches, chest pain, shortness of breath, mood changes, visual or auditory hallucinations.   Objective:   General: Well Developed, well nourished, and in no acute distress.  Neuro/Psych: Alert and oriented x3, extra-ocular muscles intact, able to move all 4 extremities, sensation grossly intact. Skin: Warm and dry, no rashes noted.  Respiratory: Not using accessory muscles, speaking in full sentences, trachea midline.  Cardiovascular: Pulses palpable, no extremity edema. Abdomen: Does not appear distended. Back Exam:  Inspection: Unremarkable  Motion: Flexion 45 deg, Extension 45 deg, Side Bending to 45 deg  bilaterally,  Rotation to 45 deg bilaterally  SLR laying: Negative  XSLR laying: Negative  Palpable tenderness: Left sacroiliac joint. FABER: negative. Sensory change: Gross sensation intact to all lumbar and sacral dermatomes.  Reflexes: 2+ at both patellar tendons, 2+ at achilles tendons, Babinski's downgoing.  Strength at foot  Plantar-flexion: 5/5 Dorsi-flexion: 5/5 Eversion: 5/5 Inversion: 5/5  Leg strength  Quad: 5/5 Hamstring: 5/5 Hip flexor: 5/5 Hip abductors: 5/5  Gait unremarkable.  Procedure: Real-time Ultrasound Guided Injection of left sacroiliac joint Device: GE Logiq E  Verbal informed consent obtained.  Time-out conducted.  Noted no overlying erythema, induration, or other signs of local infection.  Skin prepped in a sterile fashion.  Local anesthesia: Topical Ethyl chloride.  With sterile technique and under real time ultrasound guidance:  With the patient prone I was able to easily localize the sacroiliac joint in spite of a great deal of pannus, I then used a 7 inch spinal needle and advanced with a few changes in trajectory directly into the sacral iliac joint, I felt the needle dropping to the joint and then injected 1 mL kenalog 40, 2 mL lidocaine, 2 mL bupivacaine, the patient reported concordant pain during the procedure itself, needle was withdrawn and procedure completed without complication. Completed without difficulty  Pain immediately resolved suggesting accurate placement of the medication.  Advised to call if fevers/chills, erythema, induration, drainage, or persistent bleeding.  Images permanently stored and available for review in the ultrasound unit.  Impression: Technically successful ultrasound guided injection.  Impression and Recommendations:   This case required medical decision making of moderate complexity.  Pain of left sacroiliac joint Patient desires interventional treatment today. Left SI joint injection under ultrasound  guidance. Physical therapy, diclofenac.  SI joint x-rays  Return to see me in one month.

## 2016-05-21 ENCOUNTER — Other Ambulatory Visit: Payer: Self-pay | Admitting: Sports Medicine

## 2016-05-21 ENCOUNTER — Encounter: Payer: Self-pay | Admitting: Sports Medicine

## 2016-06-26 ENCOUNTER — Ambulatory Visit (INDEPENDENT_AMBULATORY_CARE_PROVIDER_SITE_OTHER): Payer: Federal, State, Local not specified - PPO | Admitting: Osteopathic Medicine

## 2016-06-26 VITALS — BP 129/81 | HR 88 | Temp 98.2°F | Wt 244.0 lb

## 2016-06-26 DIAGNOSIS — B9689 Other specified bacterial agents as the cause of diseases classified elsewhere: Secondary | ICD-10-CM

## 2016-06-26 DIAGNOSIS — R059 Cough, unspecified: Secondary | ICD-10-CM

## 2016-06-26 DIAGNOSIS — J329 Chronic sinusitis, unspecified: Secondary | ICD-10-CM | POA: Diagnosis not present

## 2016-06-26 DIAGNOSIS — R05 Cough: Secondary | ICD-10-CM

## 2016-06-26 MED ORDER — GUAIFENESIN-CODEINE 100-10 MG/5ML PO SOLN: 5.0000 mL | Freq: Four times a day (QID) | ORAL | 0 refills | Status: DC | PRN

## 2016-06-26 MED ORDER — AMOXICILLIN-POT CLAVULANATE 875-125 MG PO TABS: 1.0000 | ORAL_TABLET | Freq: Two times a day (BID) | ORAL | 0 refills | Status: DC

## 2016-06-26 NOTE — Progress Notes (Signed)
HPI: Rhonda Oneal is a 51 y.o. female  who [facility], 06/26/16,  for chief complaint of:  Chief Complaint  Patient presents with  . Establish Care  . Otalgia    left    . Location & Quality: cough and L ear pain  . Duration: 7+ days  . Modifying factors: has tried Cetirizine w/o relief . Assoc signs/symptoms: back pain with coughing   Past medical history, surgical history, social history and family history reviewed.  Patient Active Problem List   Diagnosis Date Noted  . Pain of left sacroiliac joint 04/18/2016  . Trochanteric bursitis of both hips 03/16/2016  . History of asthma 01/17/2016  . Acute non-recurrent maxillary sinusitis 12/15/2015  . Ischial bursitis 12/15/2015  . Mid back pain 03/18/2015  . 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 Radiculitis of left cervical region 11/12/2012  . Irritable bowel syndrome 11/29/2011  . Morbid obesity (HCC)   . ALLERGIC RHINITIS, SEASONAL 06/08/2010  . EDEMA LEG 03/22/2010  . DERMATITIS, ATOPIC 02/09/2010    Current medication list and allergy/intolerance information reviewed.   Current Outpatient Prescriptions on File Prior to Visit  Medication Sig Dispense Refill  . clindamycin (CLEOCIN) 300 MG capsule Take 1 capsule (300 mg total) by mouth 3 (three) times daily. X 7 days 21 capsule 0  . cyclobenzaprine (FLEXERIL) 10 MG tablet Take 10 mg by mouth 3 (three) times daily as needed for muscle spasms.    Marland Kitchen. gabapentin (NEURONTIN) 800 MG tablet One tab by mouth at breakfast, midday, 2 tabs at bedtime. 120 tablet 3  . meloxicam (MOBIC) 15 MG tablet Take 1 tablet (15 mg total) by mouth daily. 30 tablet 0  . predniSONE (STERAPRED UNI-PAK 48 TAB) 10 MG (48) TBPK tablet 12 DAY TAPER PACK, USE AS DIRECTED 1 tablet 0  . traMADol (ULTRAM) 50 MG tablet Take by mouth every 6 (six) hours as needed.     No current facility-administered medications on file prior to visit.     Allergies  Allergen Reactions  . Molds & Smuts Shortness Of Breath    Trigger for asthma   . Cymbalta [Duloxetine Hcl]     Yawning   . Ferrous Sulfate Itching      Review of Systems:  Constitutional: No recent illness  HEENT: +sinus headache, no vision change - see HPI  Cardiac: No  chest pain, No  pressure, No palpitations  Respiratory:  No  shortness of breath. +Cough  Gastrointestinal: No  abdominal pain, no change in bowel habits  Neurologic: No  weakness, No  Dizziness  Exam:  BP 129/81 (BP Location: Left Arm, Patient Position: Sitting, Cuff Size: Large)   Pulse 88   Temp 98.2 F (36.8 C) (Oral)   Wt 244 lb (110.7 kg)   SpO2 100%   BMI 44.63 kg/m   Constitutional: VS see above. General Appearance: alert, well-developed, well-nourished, NAD  Eyes: Normal lids and conjunctive, non-icteric sclera  Ears, Nose, Mouth, Throat: MMM, Normal external inspection ears/nares/mouth/lips/gums.   Neck: No masses, trachea midline.   Respiratory: Normal respiratory effort. no wheeze, no rhonchi, no rales  Cardiovascular: S1/S2 normal, no murmur, no rub/gallop auscultated. RRR.   Musculoskeletal: Gait normal. Symmetric and independent movement of all extremities  Neurological: Normal balance/coordination. No tremor.  Skin: warm, dry, intact.   Psychiatric: Normal judgment/insight. Normal mood and affect. Oriented x3.    ASSESSMENT/PLAN:   Think most likely undertreated postviral cough syndrome, would  give it a few days to see if it improves and if sinus pressure gets better, otherwise fill antibiotics  Consider medrol pack 6 days for postviral cough syndrome if no better.   Bacterial sinusitis - Plan: amoxicillin-clavulanate (AUGMENTIN) 875-125 MG tablet  Cough - Plan: guaiFENesin-codeine 100-10 MG/5ML syrup    Patient Instructions  Plan: 1. Fill antibiotics if sinus pain/pressure persists over the next few days 2. Supportive/symptomatic care as  below  Over-the-Counter Medications & Home Remedies for Upper Respiratory Illness  Note: the following list assumes no pregnancy, normal liver & kidney function and no other drug interactions. Dr. Lyn Hollingshead has highlighted medications which are safe for you to use, but these may not be appropriate for everyone. Always ask a pharmacist or qualified medical provider if you have any questions!   Aches/Pains, Fever, Headache Acetaminophen (Tylenol) 500 mg tablets - take max 2 tablets (1000 mg) every 6 hours (4 times per day)  Ibuprofen (Motrin) 200 mg tablets - take max 4 tablets (800 mg) every 6 hours*  Sinus Congestion Prescription Atrovent as directed Cromolyn Nasal Spray (NasalCrom) 1 spray each nostril 3-4 times per day, max 6 imes per day Nasal Saline if desired Oxymetolazone (Afrin, others) sparing use due to rebound congestion, NEVER use in kids Phenylephrine (Sudafed) 10 mg tablets every 4 hours (or the 12-hour formulation)* Diphenhydramine (Benadryl) 25 mg tablets - take max 2 tablets every 4 hours  Cough & Sore Throat Prescription cough pills or syrups as directed Dextromethorphan (Robitussin, others) - cough suppressant Guaifenesin (Robitussin, Mucinex, others) - expectorant (helps cough up mucus) (Dextromethorphan and Guaifenesin also come in a combination tablet) Lozenges w/ Benzocaine + Menthol (Cepacol) Honey - as much as you want! Teas which "coat the throat" - look for ingredients Elm Bark, Licorice Root, Marshmallow Root  Other Antibiotics if these are prescribed - take ALL, even if you're feeling better  Zinc Lozenges within 24 hours of symptoms onset - mixed evidence this shortens the duration of the common cold Don't waste your money on Vitamin C or Echinacea  *Caution in patients with high blood pressure       Follow-up plan: Return if symptoms worsen or fail to improve.  Visit summary with medication list and pertinent instructions was printed for patient  to review, alert [place] if any changes needed. All questions at time of visit were answered - patient instructed to contact office with any additional concerns. ER/RTC precautions were reviewed with the patient and understanding verbalized.

## 2016-06-26 NOTE — Patient Instructions (Signed)
Plan: 1. Fill antibiotics if sinus pain/pressure persists over the next few days 2. Supportive/symptomatic care as below  Over-the-Counter Medications & Home Remedies for Upper Respiratory Illness  Note: the following list assumes no pregnancy, normal liver & kidney function and no other drug interactions. Dr. Lyn HollingsheadAlexander has highlighted medications which are safe for you to use, but these may not be appropriate for everyone. Always ask a pharmacist or qualified medical provider if you have any questions!   Aches/Pains, Fever, Headache Acetaminophen (Tylenol) 500 mg tablets - take max 2 tablets (1000 mg) every 6 hours (4 times per day)  Ibuprofen (Motrin) 200 mg tablets - take max 4 tablets (800 mg) every 6 hours*  Sinus Congestion Prescription Atrovent as directed Cromolyn Nasal Spray (NasalCrom) 1 spray each nostril 3-4 times per day, max 6 imes per day Nasal Saline if desired Oxymetolazone (Afrin, others) sparing use due to rebound congestion, NEVER use in kids Phenylephrine (Sudafed) 10 mg tablets every 4 hours (or the 12-hour formulation)* Diphenhydramine (Benadryl) 25 mg tablets - take max 2 tablets every 4 hours  Cough & Sore Throat Prescription cough pills or syrups as directed Dextromethorphan (Robitussin, others) - cough suppressant Guaifenesin (Robitussin, Mucinex, others) - expectorant (helps cough up mucus) (Dextromethorphan and Guaifenesin also come in a combination tablet) Lozenges w/ Benzocaine + Menthol (Cepacol) Honey - as much as you want! Teas which "coat the throat" - look for ingredients Elm Bark, Licorice Root, Marshmallow Root  Other Antibiotics if these are prescribed - take ALL, even if you're feeling better  Zinc Lozenges within 24 hours of symptoms onset - mixed evidence this shortens the duration of the common cold Don't waste your money on Vitamin C or Echinacea  *Caution in patients with high blood pressure

## 2016-06-26 NOTE — Progress Notes (Signed)
Pt is here to establish care.  Also has pain in her back when breathing, and left ear pain.

## 2016-06-29 ENCOUNTER — Telehealth: Payer: Self-pay | Admitting: *Deleted

## 2016-06-29 NOTE — Telephone Encounter (Signed)
Patient called and states her insurance does not cover the cough medication Dr. Lyn HollingsheadAlexander called in on 06/26/2016. She wants an alternative called in. The patient says they will cover promethazine with phenergan. She also states Dr. Lyn HollingsheadAlexander siad she would call in prednisone if not any better and she would like this called in also.

## 2016-07-02 MED ORDER — PROMETHAZINE-DM 6.25-15 MG/5ML PO SYRP: 5.0000 mL | ORAL_SOLUTION | Freq: Four times a day (QID) | ORAL | 0 refills | Status: DC | PRN

## 2016-07-02 NOTE — Telephone Encounter (Signed)
New cough syrup sent to pharmacy. I see the prednisone was Artie sent.

## 2016-07-03 ENCOUNTER — Telehealth: Payer: Self-pay | Admitting: Emergency Medicine

## 2016-07-03 MED ORDER — FLUCONAZOLE 150 MG PO TABS: 150.0000 mg | ORAL_TABLET | Freq: Once | ORAL | 1 refills | Status: AC

## 2016-07-03 NOTE — Telephone Encounter (Signed)
Patient calling requesting a rx for current yeast infx. Wants [facility] to [facility] [address] pk

## 2016-07-03 NOTE — Telephone Encounter (Signed)
Can call pt and let her know Rx was sent

## 2016-07-05 ENCOUNTER — Telehealth: Payer: Self-pay

## 2016-07-05 NOTE — Telephone Encounter (Signed)
Patient called stated that she was seen in the office on may 09/2016 and was diagnosed with ear infection she stated that she was put on Amoxicillin which does not do anything for her and she still have the earache so she is requesting a different antibiotic sent to her pharmacy. Please advise. Rhonda Cunningham,CMA

## 2016-07-06 MED ORDER — CEFDINIR 300 MG PO CAPS: 300.0000 mg | ORAL_CAPSULE | Freq: Two times a day (BID) | ORAL | 0 refills | Status: DC

## 2016-07-06 NOTE — Telephone Encounter (Signed)
Please call patient: I sent alternative antibiotic but if the Augmentin did not help, she really should be seen again in the office for reevaluation. If that antibiotic was not helpful, it may never have been a bacterial infection at all, she may have something else going on with the ear or sinuses. If she would like me to place a referral to an ENT specialist, I am happy to do that as well.

## 2016-07-06 NOTE — Telephone Encounter (Signed)
Left detailed message on patient vm with results as noted below. Rhonda Cunningham,CMA  

## 2016-07-20 ENCOUNTER — Ambulatory Visit: Payer: Federal, State, Local not specified - PPO | Admitting: Sports Medicine

## 2016-09-14 ENCOUNTER — Ambulatory Visit (INDEPENDENT_AMBULATORY_CARE_PROVIDER_SITE_OTHER): Payer: Federal, State, Local not specified - PPO | Admitting: Sports Medicine

## 2016-09-14 DIAGNOSIS — M533 Sacrococcygeal disorders, not elsewhere classified: Secondary | ICD-10-CM

## 2016-09-14 DIAGNOSIS — M5412 Radiculopathy, cervical region: Secondary | ICD-10-CM | POA: Diagnosis not present

## 2016-09-14 NOTE — Assessment & Plan Note (Signed)
5 month response to previous SI joint injection, repeat left SI joint injection today under ultrasound guidance.

## 2016-09-14 NOTE — Assessment & Plan Note (Signed)
At this point his failed NSAIDs, muscle relaxers, I'm going to give her a bit of more therapy while we are awaiting her cervical spine MRI. She has right upper cervical radicular pain, as well as C5-C6 degenerative changes on her x-rays. Return to go over MRI results.

## 2016-09-14 NOTE — Progress Notes (Signed)
  Subjective:    CC: Neck and back pain  HPI: This is a pleasant 51 year old female, we have treated her in the past for cervical radiculopathy, she is having recurrence of pain, moderate, persistent and at this point she has failed greater than 6 weeks of physician directed rehabilitation, steroids, NSAIDs, muscle relaxers.  Low back pain: Left SI joint injection 5 months ago provided good relief, desires repeat injection today.  Past medical history:  Negative.  See flowsheet/record as well for more information.  Surgical history: Negative.  See flowsheet/record as well for more information.  Family history: Negative.  See flowsheet/record as well for more information.  Social history: Negative.  See flowsheet/record as well for more information.  Allergies, and medications have been entered into the medical record, reviewed, and no changes needed.   Review of Systems: No fevers, chills, night sweats, weight loss, chest pain, or shortness of breath.   Objective:    General: Well Developed, well nourished, and in no acute distress.  Neuro: Alert and oriented x3, extra-ocular muscles intact, sensation grossly intact.  HEENT: Normocephalic, atraumatic, pupils equal round reactive to light, neck supple, no masses, no lymphadenopathy, thyroid nonpalpable.  Skin: Warm and dry, no rashes. Cardiac: Regular rate and rhythm, no murmurs rubs or gallops, no lower extremity edema.  Respiratory: Clear to auscultation bilaterally. Not using accessory muscles, speaking in full sentences. Neck: Negative spurling's Full neck range of motion Grip strength and sensation normal in bilateral hands Strength good C4 to T1 distribution No sensory change to C4 to T1 Reflexes normal  Procedure: Real-time Ultrasound Guided Injection of left sacroiliac joint Device: GE Logiq E  Verbal informed consent obtained.  Time-out conducted.  Noted no overlying erythema, induration, or other signs of local  infection.  Skin prepped in a sterile fashion.  Local anesthesia: Topical Ethyl chloride.  With sterile technique and under real time ultrasound guidance:  Taking care to avoid the S1 foramen I guided a 22-gauge spinal needle into the sacroiliac joint and then injected 1 mL kenalog 40, 2 mL lidocaine, 2 mL bupivacaine. Completed without difficulty  Pain immediately resolved suggesting accurate placement of the medication.  Advised to call if fevers/chills, erythema, induration, drainage, or persistent bleeding.  Images permanently stored and available for review in the ultrasound unit.  Impression: Technically successful ultrasound guided injection.  Impression and Recommendations:    Radiculitis of left cervical region At this point his failed NSAIDs, muscle relaxers, I'm going to give her a bit of more therapy while we are awaiting her cervical spine MRI. She has right upper cervical radicular pain, as well as C5-C6 degenerative changes on her x-rays. Return to go over MRI results.  Pain of left sacroiliac joint 5 month response to previous SI joint injection, repeat left SI joint injection today under ultrasound guidance.

## 2016-09-17 ENCOUNTER — Encounter: Payer: Self-pay | Admitting: Osteopathic Medicine

## 2016-09-17 ENCOUNTER — Ambulatory Visit (INDEPENDENT_AMBULATORY_CARE_PROVIDER_SITE_OTHER): Payer: Federal, State, Local not specified - PPO | Admitting: Osteopathic Medicine

## 2016-09-17 VITALS — BP 148/98 | HR 94 | Ht 62.0 in | Wt 238.0 lb

## 2016-09-17 DIAGNOSIS — I10 Essential (primary) hypertension: Secondary | ICD-10-CM

## 2016-09-17 DIAGNOSIS — M5412 Radiculopathy, cervical region: Secondary | ICD-10-CM

## 2016-09-17 DIAGNOSIS — F458 Other somatoform disorders: Secondary | ICD-10-CM

## 2016-09-17 DIAGNOSIS — H6983 Other specified disorders of Eustachian tube, bilateral: Secondary | ICD-10-CM

## 2016-09-17 DIAGNOSIS — R198 Other specified symptoms and signs involving the digestive system and abdomen: Secondary | ICD-10-CM

## 2016-09-17 DIAGNOSIS — R0989 Other specified symptoms and signs involving the circulatory and respiratory systems: Secondary | ICD-10-CM

## 2016-09-17 DIAGNOSIS — H6993 Unspecified Eustachian tube disorder, bilateral: Secondary | ICD-10-CM

## 2016-09-17 DIAGNOSIS — R09A2 Foreign body sensation, throat: Secondary | ICD-10-CM

## 2016-09-17 MED ORDER — IPRATROPIUM BROMIDE 0.06 % NA SOLN: 2.0000 | Freq: Four times a day (QID) | NASAL | 0 refills | Status: DC

## 2016-09-17 MED ORDER — MELOXICAM 15 MG PO TABS: 15.0000 mg | ORAL_TABLET | Freq: Every day | ORAL | 0 refills | Status: DC

## 2016-09-17 MED ORDER — CYCLOBENZAPRINE HCL 10 MG PO TABS: 5.0000 mg | ORAL_TABLET | Freq: Three times a day (TID) | ORAL | 0 refills | Status: DC | PRN

## 2016-09-17 NOTE — Progress Notes (Signed)
HPI: Rhonda Oneal is a 51 y.o. female who [facility] [place] [facility] for chief complaint of:  Chief Complaint  Patient presents with  . Dysphagia  . Ear Pain   Acute Illness: Ear discomfort, feels like pressure/underwater. Associated with neck pain, swallowing discomfort without choking or early satiety. Seen for L otalgia 06/2016 Rx Augmentin. Neck pain described as soreness, on right side, nothing really makes it better or worse, she hasn't tried any medications for it. Symptoms have been ongoing for about 2 weeks, on and off.    Past medical, social and family history reviewed.   Immune compromising conditions or other risk factors: none. History of cervical radiculitis symptoms  Current medications and allergies reviewed.     Review of Systems:  Constitutional: No  fever/chills  HEENT: No  headache, No  sore throat, No  swollen glands  Cardiovascular: No chest pain  Respiratory:No  cough, No  shortness of breath  Gastrointestinal: No  nausea, No  vomiting,  No  diarrhea  Musculoskeletal:   Yes  myalgia/arthralgia  Skin/Integument:  No  rash   Detailed Exam:  BP (!) 148/98   Pulse 94   Ht 5\' 2"  (1.575 m)   Wt 238 lb (108 kg)   BMI 43.53 kg/m   Constitutional:   VSS, see above.   General Appearance: alert, well-developed, well-nourished, NAD  Eyes:   Normal lids and conjunctive, non-icteric sclera  Ears, Nose, Mouth, Throat:   Normal external inspection ears/nares  Normal mouth/lips/gums, MMM  normal TM  posterior pharynx without erythema, without exudate  nasal mucosa normal  Skin:  Normal inspection, no rash or concerning lesions noted on limited exam  Neck:   No masses, trachea midline. normal lymph nodes  Paraspinal tenderness right cervical/trapezius muscular region  Respiratory:   Normal respiratory effort.   No  wheeze/rhonchi/rales  Cardiovascular:   S1/S2 normal, no  murmur/rub/gallop auscultated. RRR.   ASSESSMENT/PLAN:  Globus sensation - Plan: Ambulatory referral to ENT  Dysfunction of both eustachian tubes - Plan: Ambulatory referral to ENT, ipratropium (ATROVENT) 0.06 % nasal spray  Radiculitis of left cervical region - Plan: cyclobenzaprine (FLEXERIL) 10 MG tablet, meloxicam (MOBIC) 15 MG tablet  Hypertension, unspecified type - Follow-up with PCP     Patient Instructions  Plan: I think let's try a few treatments for eustachian tube dysfunction and postnasal drip as well as musculoskeletal causes of neck pain. See medication list. I think we may end up needing to refer you to and ear nose and throat specialist who can take a look down the throat with the camera to further evaluate the throat symptoms. I went ahead and placed to this referral today, I would like you to see the specialist in the next 1-2 weeks if you are not feeling any better with our treatments. If what we are doing is helping, you can cancel the appointment with the specialist.    Visit summary was printed for the patient with medications and pertinent instructions for patient to review. ER/RTC precautions reviewed. All questions answered. Return if symptoms worsen or fail to improve.

## 2016-09-17 NOTE — Patient Instructions (Signed)
Plan: I think let's try a few treatments for eustachian tube dysfunction and postnasal drip as well as musculoskeletal causes of neck pain. See medication list. I think we may end up needing to refer you to and ear nose and throat specialist who can take a look down the throat with the camera to further evaluate the throat symptoms. I went ahead and placed to this referral today, I would like you to see the specialist in the next 1-2 weeks if you are not feeling any better with our treatments. If what we are doing is helping, you can cancel the appointment with the specialist.

## 2016-09-24 ENCOUNTER — Ambulatory Visit (INDEPENDENT_AMBULATORY_CARE_PROVIDER_SITE_OTHER): Payer: Federal, State, Local not specified - PPO

## 2016-09-24 DIAGNOSIS — M4802 Spinal stenosis, cervical region: Secondary | ICD-10-CM

## 2016-09-24 DIAGNOSIS — M5412 Radiculopathy, cervical region: Secondary | ICD-10-CM

## 2016-09-27 ENCOUNTER — Other Ambulatory Visit: Payer: Self-pay | Admitting: Sports Medicine

## 2016-09-27 ENCOUNTER — Encounter: Payer: Self-pay | Admitting: Sports Medicine

## 2016-09-28 ENCOUNTER — Other Ambulatory Visit: Payer: Self-pay | Admitting: Sports Medicine

## 2016-10-01 ENCOUNTER — Encounter: Payer: Self-pay | Admitting: Sports Medicine

## 2016-10-01 ENCOUNTER — Ambulatory Visit (INDEPENDENT_AMBULATORY_CARE_PROVIDER_SITE_OTHER): Payer: Federal, State, Local not specified - PPO | Admitting: Sports Medicine

## 2016-10-01 DIAGNOSIS — M533 Sacrococcygeal disorders, not elsewhere classified: Secondary | ICD-10-CM | POA: Diagnosis not present

## 2016-10-01 DIAGNOSIS — M4802 Spinal stenosis, cervical region: Secondary | ICD-10-CM

## 2016-10-01 NOTE — Progress Notes (Signed)
  Subjective:    CC: MRI results  HPI: This is a very pleasant 51 year old female, she has had left-sided upper cervical radicular symptoms for some time now, pain is now on the right side, she failed physical therapy, steroids, NSAIDs, we obtained an MRI the results of which will be dictated below. Has persistent pain.  Left SI joint dysfunction: Resolved after left sacroiliac joint injection at the last visit.  Past medical history:  Negative.  See flowsheet/record as well for more information.  Surgical history: Negative.  See flowsheet/record as well for more information.  Family history: Negative.  See flowsheet/record as well for more information.  Social history: Negative.  See flowsheet/record as well for more information.  Allergies, and medications have been entered into the medical record, reviewed, and no changes needed.   Review of Systems: No fevers, chills, night sweats, weight loss, chest pain, or shortness of breath.   Objective:    General: Well Developed, well nourished, and in no acute distress.  Neuro: Alert and oriented x3, extra-ocular muscles intact, sensation grossly intact.  HEENT: Normocephalic, atraumatic, pupils equal round reactive to light, neck supple, no masses, no lymphadenopathy, thyroid nonpalpable.  Skin: Warm and dry, no rashes. Cardiac: Regular rate and rhythm, no murmurs rubs or gallops, no lower extremity edema.  Respiratory: Clear to auscultation bilaterally. Not using accessory muscles, speaking in full sentences.  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  Impression and Recommendations:    Cervical spinal stenosis MRI does confirm C5-6 spinal stenosis with narrowing down to C7 level as well. We are going to proceed with a right C6-C7 interlaminar epidural. Return to see me one month after injection to evaluate relief.   Pain of left sacroiliac joint Left SI joint  injection at the last visit has provided good relief.  I spent 25 minutes with this patient, greater than 50% was face-to-face time counseling regarding the above diagnoses

## 2016-10-01 NOTE — Assessment & Plan Note (Signed)
Left SI joint injection at the last visit has provided good relief.

## 2016-10-01 NOTE — Assessment & Plan Note (Addendum)
MRI does confirm C5-6 spinal stenosis with narrowing down to C7 level as well. We are going to proceed with a right C6-C7 interlaminar epidural. Return to see me one month after injection to evaluate relief.

## 2016-10-14 ENCOUNTER — Other Ambulatory Visit: Payer: Self-pay | Admitting: Osteopathic Medicine

## 2016-10-14 DIAGNOSIS — M5412 Radiculopathy, cervical region: Secondary | ICD-10-CM

## 2016-10-30 ENCOUNTER — Ambulatory Visit (INDEPENDENT_AMBULATORY_CARE_PROVIDER_SITE_OTHER): Payer: Federal, State, Local not specified - PPO | Admitting: Physician Assistant

## 2016-10-30 ENCOUNTER — Encounter: Payer: Self-pay | Admitting: Physician Assistant

## 2016-10-30 VITALS — BP 146/82 | HR 113 | Temp 98.5°F | Ht 64.0 in | Wt 239.0 lb

## 2016-10-30 DIAGNOSIS — J4521 Mild intermittent asthma with (acute) exacerbation: Secondary | ICD-10-CM | POA: Diagnosis not present

## 2016-10-30 MED ORDER — ALBUTEROL SULFATE HFA 108 (90 BASE) MCG/ACT IN AERS: 1.0000 | INHALATION_SPRAY | RESPIRATORY_TRACT | 5 refills | Status: DC | PRN

## 2016-10-30 MED ORDER — IPRATROPIUM-ALBUTEROL 0.5-2.5 (3) MG/3ML IN SOLN
3.0000 mL | Freq: Once | RESPIRATORY_TRACT | Status: AC
  Administered 2016-10-30: 3 mL via RESPIRATORY_TRACT

## 2016-10-30 MED ORDER — PREDNISONE 50 MG PO TABS: ORAL_TABLET | ORAL | 0 refills | Status: DC

## 2016-10-30 MED ORDER — METHYLPREDNISOLONE SODIUM SUCC 125 MG IJ SOLR
125.0000 mg | Freq: Once | INTRAMUSCULAR | Status: AC
  Administered 2016-10-30: 125 mg via INTRAMUSCULAR

## 2016-10-30 NOTE — Progress Notes (Signed)
Subjective:    Patient ID: Rhonda Oneal, female    DOB: 07/07/1965, 51 y.o.   MRN: [id number]  HPI  Pt is a 51 yo female who presents to the clinic with SOB, chest tightness and wheezing for about 1 week. She has a hx of asthma controlled with as needed rescue inhalers, zyrtec, and flonase. No fever, chills. Her cough is not productive. She does have a plugged sensation of left ear. No ST. Her neb machine at home is broken and needs new machine. She has been using her albuterol inhaler every few hours to get relief. Denies any sinus pressure.   .. Active Ambulatory Problems    Diagnosis Date Noted  . DERMATITIS, ATOPIC 02/09/2010  . EDEMA LEG 03/22/2010  . ALLERGIC RHINITIS, SEASONAL 06/08/2010  . Morbid obesity (HCC)   . Irritable bowel syndrome 11/29/2011  . Cervical spinal stenosis 11/12/2012  . 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 Mid back pain 03/18/2015  . Acute non-recurrent maxillary sinusitis 12/15/2015  . Ischial bursitis 12/15/2015  . History of asthma 01/17/2016  . Trochanteric bursitis of both hips 03/16/2016  . Pain of left sacroiliac joint 04/18/2016   Resolved Ambulatory Problems    Diagnosis Date Noted  . Right lumbar radiculitis 05/13/2012  . Lumbosacral strain 03/16/2016   Past Medical History:  Diagnosis Date  . Arthritis   . Asthma   . DERMATITIS, ATOPIC 02/09/2010  . EDEMA LEG 03/22/2010  . Fibromyalgia   . IBS (irritable bowel syndrome)   . Morbid obesity (HCC)        Review of Systems  All other systems reviewed and are negative.      Objective:   Physical Exam  Constitutional: She is oriented to person, place, and time. She appears well-developed and well-nourished.  Obese.   HENT:  Head: Normocephalic and atraumatic.  Right Ear: External ear normal.  Left Ear: External ear normal.  Nose: Nose normal.  Mouth/Throat: Oropharynx is clear and moist. No oropharyngeal exudate.  No tenderness over sinuses.  TM's clear  bilaterally.   Eyes: Conjunctivae are normal. Right eye exhibits no discharge. Left eye exhibits no discharge.  Neck: Normal range of motion. Neck supple.  Cardiovascular: Regular rhythm and normal heart sounds.   Tachycardia.   Pulmonary/Chest:  inspiratory and expiratory wheezing heard best at base but throughout the lungs.   Lymphadenopathy:    She has no cervical adenopathy.  Neurological: She is alert and oriented to person, place, and time.  Psychiatric: She has a normal mood and affect. Her behavior is normal.          Assessment & Plan:  Marland Kitchen.Marland Kitchen.Rhonda Oneal was seen today for cough.  Diagnoses and all orders for this visit:  Mild intermittent asthma with exacerbation -     predniSONE (DELTASONE) 50 MG tablet; Take one tablet for 5 days. -     albuterol (PROVENTIL HFA;VENTOLIN HFA) 108 (90 Base) MCG/ACT inhaler; Inhale 1-2 puffs into the lungs every 4 (four) hours as needed for wheezing or shortness of breath. -     methylPREDNISolone sodium succinate (SOLU-MEDROL) 125 mg/2 mL injection 125 mg; Inject 2 mLs (125 mg total) into the muscle once. -     ipratropium-albuterol (DUONEB) 0.5-2.5 (3) MG/3ML nebulizer solution 3 mL; Take 3 mLs by nebulization once. -     AMBULATORY NON FORMULARY MEDICATION; One nebulizer machine with accesories for mild persistent asthma   Discussed appears to be a viral/allergic induced asthma exacerbation.  Will  get new neb machine.  duoneb given in office today.  Solumedrol given today with prednisone to start tomorrow.  Continue using rescue every 2 to 4 hours as needed.  Stay on zyrtec and flonase.  Follow up if having new symptoms or not improving.

## 2016-10-30 NOTE — Patient Instructions (Addendum)
If has frequent exacerbations need to consider preventative.   Asthma, Adult Asthma is a condition of the lungs in which the airways tighten and narrow. Asthma can make it hard to breathe. Asthma cannot be cured, but medicine and lifestyle changes can help control it. Asthma may be started (triggered) by:  Animal skin flakes (dander).  Dust.  Cockroaches.  Pollen.  Mold.  Smoke.  Cleaning products.  Hair sprays or aerosol sprays.  Paint fumes or strong smells.  Cold air, weather changes, and winds.  Crying or laughing hard.  Stress.  Certain medicines or drugs.  Foods, such as dried fruit, potato chips, and sparkling grape juice.  Infections or conditions (colds, flu).  Exercise.  Certain medical conditions or diseases.  Exercise or tiring activities.  Follow these instructions at home:  Take medicine as told by your doctor.  Use a peak flow meter as told by your doctor. A peak flow meter is a tool that measures how well the lungs are working.  Record and keep track of the peak flow meter's readings.  Understand and use the asthma action plan. An asthma action plan is a written plan for taking care of your asthma and treating your attacks.  To help prevent asthma attacks: ? Do not smoke. Stay away from secondhand smoke. ? Change your heating and air conditioning filter often. ? Limit your use of fireplaces and wood stoves. ? Get rid of pests (such as roaches and mice) and their droppings. ? Throw away plants if you see mold on them. ? Clean your floors. Dust regularly. Use cleaning products that do not smell. ? Have someone vacuum when you are not home. Use a vacuum cleaner with a HEPA filter if possible. ? Replace carpet with wood, tile, or vinyl flooring. Carpet can trap animal skin flakes and dust. ? Use allergy-proof pillows, mattress covers, and box spring covers. ? Wash bed sheets and blankets every week in hot water and dry them in a dryer. ? Use  blankets that are made of polyester or cotton. ? Clean bathrooms and kitchens with bleach. If possible, have someone repaint the walls in these rooms with mold-resistant paint. Keep out of the rooms that are being cleaned and painted. ? Wash hands often. Contact a doctor if:  You have make a whistling sound when breaking (wheeze), have shortness of breath, or have a cough even if taking medicine to prevent attacks.  The colored mucus you cough up (sputum) is thicker than usual.  The colored mucus you cough up changes from clear or white to yellow, green, gray, or bloody.  You have problems from the medicine you are taking such as: ? A rash. ? Itching. ? Swelling. ? Trouble breathing.  You need reliever medicines more than 2-3 times a week.  Your peak flow measurement is still at 50-79% of your personal best after following the action plan for 1 hour.  You have a fever. Get help right away if:  You seem to be worse and are not responding to medicine during an asthma attack.  You are short of breath even at rest.  You get short of breath when doing very little activity.  You have trouble eating, drinking, or talking.  You have chest pain.  You have a fast heartbeat.  Your lips or fingernails start to turn blue.  You are light-headed, dizzy, or faint.  Your peak flow is less than 50% of your personal best. This information is not intended to replace  advice given to you by your health care provider. Make sure you discuss any questions you have with your health care provider. Document Released: 07/25/2007 Document Revised: 07/14/2015 Document Reviewed: 09/04/2012 Elsevier Interactive Patient Education  2017 [employer]

## 2016-10-31 ENCOUNTER — Telehealth: Payer: Self-pay

## 2016-10-31 MED ORDER — AZITHROMYCIN 250 MG PO TABS: ORAL_TABLET | ORAL | 0 refills | Status: DC

## 2016-10-31 NOTE — Telephone Encounter (Signed)
Sent to pharmacy 

## 2016-10-31 NOTE — Telephone Encounter (Signed)
Rhonda Oneal left a message stating she is not feeling better. She was advised to call back for antibiotic if not better. Please advise.

## 2016-11-01 ENCOUNTER — Encounter: Payer: Self-pay | Admitting: Physician Assistant

## 2016-11-01 MED ORDER — AMBULATORY NON FORMULARY MEDICATION: 0 refills | Status: DC

## 2016-11-01 NOTE — Telephone Encounter (Signed)
Left message advising patient of antibiotic being sent to the pharmacy.

## 2016-11-05 ENCOUNTER — Encounter: Payer: Self-pay | Admitting: Physician Assistant

## 2016-11-05 ENCOUNTER — Ambulatory Visit (INDEPENDENT_AMBULATORY_CARE_PROVIDER_SITE_OTHER): Payer: Federal, State, Local not specified - PPO | Admitting: Physician Assistant

## 2016-11-05 ENCOUNTER — Ambulatory Visit (INDEPENDENT_AMBULATORY_CARE_PROVIDER_SITE_OTHER): Payer: Federal, State, Local not specified - PPO

## 2016-11-05 VITALS — BP 130/76 | HR 92 | Temp 98.0°F | Ht 64.0 in | Wt 242.0 lb

## 2016-11-05 DIAGNOSIS — J0100 Acute maxillary sinusitis, unspecified: Secondary | ICD-10-CM | POA: Diagnosis not present

## 2016-11-05 DIAGNOSIS — R05 Cough: Secondary | ICD-10-CM

## 2016-11-05 DIAGNOSIS — J4521 Mild intermittent asthma with (acute) exacerbation: Secondary | ICD-10-CM | POA: Insufficient documentation

## 2016-11-05 DIAGNOSIS — T161XXA Foreign body in right ear, initial encounter: Secondary | ICD-10-CM | POA: Insufficient documentation

## 2016-11-05 DIAGNOSIS — R059 Cough, unspecified: Secondary | ICD-10-CM | POA: Insufficient documentation

## 2016-11-05 MED ORDER — BENZONATATE 200 MG PO CAPS: 200.0000 mg | ORAL_CAPSULE | Freq: Two times a day (BID) | ORAL | 0 refills | Status: DC | PRN

## 2016-11-05 MED ORDER — FLUTICASONE PROPIONATE HFA 220 MCG/ACT IN AERO: 2.0000 | INHALATION_SPRAY | Freq: Two times a day (BID) | RESPIRATORY_TRACT | 2 refills | Status: DC

## 2016-11-05 MED ORDER — PREDNISONE 20 MG PO TABS: ORAL_TABLET | ORAL | 0 refills | Status: DC

## 2016-11-05 MED ORDER — HYDROCODONE-HOMATROPINE 5-1.5 MG/5ML PO SYRP: 5.0000 mL | ORAL_SOLUTION | Freq: Every evening | ORAL | 0 refills | Status: DC | PRN

## 2016-11-05 NOTE — Progress Notes (Signed)
Call pt: CXR is normal. Great news. I will give longer taper of prednisone to see if we can get rid of this ongoing inflammation.

## 2016-11-05 NOTE — Progress Notes (Addendum)
Subjective:    Patient ID: Rhonda Oneal, female    DOB: 01-18-1966, 50 y.o.   MRN: [id number]  HPI  Pt was seen on 10/30/16 for asthma exacerbation she is better but continues to have sinus drainage, pressure, cough. Wheezing has improved. She was sent zpak and finished yesterday as well as prednisone. She continues to use proair as needed once or twice a day. She is not on daily inhaler and not needed one in the last 7 years. She continues to take zyrtec and flonase. She has been on mucinex for last week. No fever or chills.       Review of Systems    see HPI.  Objective:   Physical Exam  Constitutional: She is oriented to person, place, and time. She appears well-developed and well-nourished.  HENT:  Head: Normocephalic and atraumatic.  Ears:  Mouth/Throat: Oropharynx is clear and moist. No oropharyngeal exudate.  Tenderness over maxillary sinuses to palpation.   Eyes: Conjunctivae are normal. Right eye exhibits no discharge. Left eye exhibits no discharge.  Neck: Normal range of motion. Neck supple.  Cardiovascular: Normal rate, regular rhythm and normal heart sounds.   Pulmonary/Chest: Effort normal and breath sounds normal.  At the end of expiration some residual rhonchi at base of lungs. No wheezing.   Lymphadenopathy:    She has no cervical adenopathy.  Neurological: She is alert and oriented to person, place, and time.  Psychiatric: She has a normal mood and affect. Her behavior is normal.          Assessment & Plan:  Marland KitchenMarland KitchenDiagnoses and all orders for this visit:  Cough -     HYDROcodone-homatropine (HYCODAN) 5-1.5 MG/5ML syrup; Take 5 mLs by mouth at bedtime as needed. -     benzonatate (TESSALON) 200 MG capsule; Take 1 capsule (200 mg total) by mouth 2 (two) times daily as needed for cough. -     fluticasone (FLOVENT HFA) 220 MCG/ACT inhaler; Inhale 2 puffs into the lungs 2 (two) times daily. -     DG Chest 2 View -     predniSONE (DELTASONE) 20 MG tablet; Take  3 tablets for 3 days, then 2 tablets for 3 days, then 1 tablet for 3 days, then 1/2 tablet for 4 days.  Acute non-recurrent maxillary sinusitis -     benzonatate (TESSALON) 200 MG capsule; Take 1 capsule (200 mg total) by mouth 2 (two) times daily as needed for cough. -     fluticasone (FLOVENT HFA) 220 MCG/ACT inhaler; Inhale 2 puffs into the lungs 2 (two) times daily. -     predniSONE (DELTASONE) 20 MG tablet; Take 3 tablets for 3 days, then 2 tablets for 3 days, then 1 tablet for 3 days, then 1/2 tablet for 4 days.  Acute foreign body of right ear canal, initial encounter -     Ambulatory referral to ENT  Mild intermittent asthma with exacerbation -     benzonatate (TESSALON) 200 MG capsule; Take 1 capsule (200 mg total) by mouth 2 (two) times daily as needed for cough. -     fluticasone (FLOVENT HFA) 220 MCG/ACT inhaler; Inhale 2 puffs into the lungs 2 (two) times daily. -     predniSONE (DELTASONE) 20 MG tablet; Take 3 tablets for 3 days, then 2 tablets for 3 days, then 1 tablet for 3 days, then 1/2 tablet for 4 days.   CXR no signs of pneumonia.  Longer round of prednisone.  Start flovent.  Hycodan  for cough at bedtime. Controlled substance database reviewed with no concerns.  Tessalon pearls for during the day.  Continue zyrtec/flonase.

## 2016-11-06 ENCOUNTER — Encounter: Payer: Self-pay | Admitting: Physician Assistant

## 2016-11-06 MED ORDER — AMOXICILLIN-POT CLAVULANATE 875-125 MG PO TABS: 1.0000 | ORAL_TABLET | Freq: Two times a day (BID) | ORAL | 0 refills | Status: DC

## 2016-11-08 ENCOUNTER — Telehealth: Payer: Self-pay | Admitting: *Deleted

## 2016-11-17 ENCOUNTER — Other Ambulatory Visit: Payer: Self-pay | Admitting: Osteopathic Medicine

## 2016-11-17 DIAGNOSIS — M5412 Radiculopathy, cervical region: Secondary | ICD-10-CM

## 2016-12-07 DIAGNOSIS — M4310 Spondylolisthesis, site unspecified: Secondary | ICD-10-CM | POA: Insufficient documentation

## 2016-12-12 ENCOUNTER — Ambulatory Visit: Payer: Federal, State, Local not specified - PPO | Admitting: Physician Assistant

## 2016-12-12 ENCOUNTER — Telehealth: Payer: Self-pay | Admitting: Physician Assistant

## 2016-12-12 NOTE — Telephone Encounter (Signed)
F.Y.I Pt [facility] I a dv pt that in the pcp field it states [facility] and that I do not think pt can have two primary care providers at one time. 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 Pt has been pt here since 2013 est with Dr. Ivan AnchorsHommel. Pt was scheduled for 12/12/16 and when time for check in front desk rep Esmeralda LinksKim Grice adv pt per  [facility] not have two primary care providers and that she would need to decide which provider she would like to see. Pt advised unable to be seen today if she was not choosing [facility] as provider. Pt left office unseen and called back to our practice on three way with her insurance company who questioned why she could not be seen and that per her insurance policy she can see any primary care provider she wanted. [employer] rep asked who makes the decision that pt can not see two primary care doctors. Adv conflict of interest and our office doesn't allow. No set in stone rule that I knew of but transferred to office manager Haskell RilingWendy Blum for further questioning and explanation.

## 2016-12-12 NOTE — Telephone Encounter (Signed)
Yes she needs to pick PCP [facility] if [facility]CP can not see her.

## 2016-12-12 NOTE — Telephone Encounter (Signed)
Okay I just wanted to put a phone note in her chart because she asked to speak with office manager and her insurance company [facility] a [facility]. Thanks

## 2016-12-16 ENCOUNTER — Encounter: Payer: Self-pay | Admitting: Emergency Medicine

## 2016-12-16 ENCOUNTER — Emergency Department (INDEPENDENT_AMBULATORY_CARE_PROVIDER_SITE_OTHER)
Admission: EM | Admit: 2016-12-16 | Discharge: 2016-12-16 | Disposition: A | Discharge status: Home / Self Care | Payer: Federal, State, Local not specified - PPO | Source: Home / Self Care

## 2016-12-16 DIAGNOSIS — N9489 Other specified conditions associated with female genital organs and menstrual cycle: Secondary | ICD-10-CM | POA: Diagnosis not present

## 2016-12-16 HISTORY — DX: Other nonmedicinal substance allergy status: Z91.048

## 2016-12-16 MED ORDER — DOXYCYCLINE HYCLATE 100 MG PO CAPS: 100.0000 mg | ORAL_CAPSULE | Freq: Two times a day (BID) | ORAL | 0 refills | Status: DC

## 2016-12-16 NOTE — ED Provider Notes (Signed)
[facility]    CSN: [id number] Arrival date & time: 12/16/16  1413     History   Chief Complaint Chief Complaint  Patient presents with  . Mass    right labia    HPI Rhonda Oneal is a 51 y.o. female.   The history is provided by the patient. No language interpreter was used.  Rash  Location:  Pelvis Pelvic rash location:  Vulva Quality: redness   Severity:  Moderate Onset quality:  Gradual Timing:  Constant Progression:  Worsening Chronicity:  New Relieved by:  Nothing Worsened by:  Nothing Ineffective treatments:  None tried  Pt reports she has a pustule on each labia Past Medical History:  Diagnosis Date  . Allergy to mold   . Arthritis   . Asthma   . DERMATITIS, ATOPIC 02/09/2010   Qualifier: Diagnosis of  By: Orson AloeHenderson MD, Tinnie GensJeffrey    . EDEMA LEG 03/22/2010   Qualifier: Diagnosis of  By: Orson AloeHenderson MD, Tinnie GensJeffrey    . Fibromyalgia   . IBS (irritable bowel syndrome)   . Morbid obesity [facility](HCC)     Patient Active Problem List   Diagnosis Date Noted  . Acute foreign body of right ear canal 11/05/2016  . Cough 11/05/2016  . Mild intermittent asthma with exacerbation 11/05/2016  . Pain of left sacroiliac joint 04/18/2016  . Trochanteric bursitis of both hips 03/16/2016  . History of asthma 01/17/2016  . Acute non-recurrent maxillary sinusitis 12/15/2015  . Ischial bursitis 12/15/2015  . Mid back pain 03/18/2015  . 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 Cervical spinal stenosis 11/12/2012  . Irritable bowel syndrome 11/29/2011  . Morbid obesity (HCC)   . ALLERGIC RHINITIS, SEASONAL 06/08/2010  . EDEMA LEG 03/22/2010  . DERMATITIS, ATOPIC 02/09/2010    Past Surgical History:  Procedure Laterality Date  . CESAREAN SECTION    . TONSILLECTOMY AND ADENOIDECTOMY    . WISDOM TOOTH EXTRACTION      OB History    Gravida Para Term Preterm AB Living   2 2           SAB TAB Ectopic Multiple Live Births                   Home  Medications    Prior to Admission medications   Medication Sig Start Date End Date Taking? Authorizing Provider  albuterol (PROVENTIL HFA;VENTOLIN HFA) 108 (90 Base) MCG/ACT inhaler Inhale 1-2 puffs into the lungs every 4 (four) hours as needed for wheezing or shortness of breath. 10/30/16   Breeback, Lonna CobbJade L, PA-C  AMBULATORY NON FORMULARY MEDICATION One nebulizer machine with accesories for mild persistent asthma 11/01/16   Tandy GawBreeback, Jade L, PA-C  amoxicillin-clavulanate (AUGMENTIN) 875-125 MG tablet Take 1 tablet by mouth 2 (two) times daily. For 10 days. 11/06/16   Breeback, Jade L, PA-C  benzonatate (TESSALON) 200 MG capsule Take 1 capsule (200 mg total) by mouth 2 (two) times daily as needed for cough. 11/05/16   Breeback, Jade L, PA-C  cetirizine (ZYRTEC) 10 MG tablet Take 10 mg by mouth daily.    [provider]  cyclobenzaprine (FLEXERIL) 10 MG tablet Take 0.5-1 tablets (5-10 mg total) by mouth 3 (three) times daily as needed for muscle spasms. 09/17/16   Sunnie NielsenAlexander, Natalie, DO  doxycycline (VIBRAMYCIN) 100 MG capsule Take 1 capsule (100 mg total) by mouth 2 (two) times daily. 12/16/16   Elson AreasSofia, Hason Ofarrell K, PA-C  fluticasone (FLONASE) 50 MCG/ACT nasal spray  USE  1 SPRAY IN EACH NOSTRIL DAILY 06/05/16   [provider]  fluticasone (FLOVENT HFA) 220 MCG/ACT inhaler Inhale 2 puffs into the lungs 2 (two) times daily. 11/05/16   Breeback, Jade L, PA-C  gabapentin (NEURONTIN) 800 MG tablet One tab by mouth at breakfast, midday, 2 tabs at bedtime. 07/06/15   Laren Boom, DO  HYDROcodone-acetaminophen (NORCO/VICODIN) 5-325 MG tablet Take by mouth. 10/21/16   [provider]  HYDROcodone-homatropine (HYCODAN) 5-1.5 MG/5ML syrup Take 5 mLs by mouth at bedtime as needed. 11/05/16   Breeback, Jade L, PA-C  ipratropium (ATROVENT) 0.06 % nasal spray Place 2 sprays into both nostrils 4 (four) times daily. 09/17/16   Sunnie Nielsen, DO  meloxicam (MOBIC) 15 MG tablet TAKE 1 TABLET (15 MG  TOTAL) BY MOUTH DAILY. AS NEEDED FOR MUSCLE/JOINT PAIN 11/19/16   Tandy Gaw L, PA-C  metoprolol tartrate (LOPRESSOR) 25 MG tablet Take 25 mg by mouth every morning.    [provider]  naproxen (NAPROSYN) 500 MG tablet Take by mouth. 10/25/16   [provider]  predniSONE (DELTASONE) 20 MG tablet Take 3 tablets for 3 days, then 2 tablets for 3 days, then 1 tablet for 3 days, then 1/2 tablet for 4 days. 11/05/16   Breeback, Lonna Cobb, PA-C  traMADol (ULTRAM) 50 MG tablet Take by mouth every 6 (six) hours as needed.    [provider]    Family History Family History  Problem Relation Age of Onset  . Diabetes Mother   . Cancer Mother        Lung  . Hyperlipidemia Father   . Breast cancer Maternal Aunt   . Ovarian cancer Paternal Aunt     Social History Social History  Substance Use Topics  . Smoking status: Never Smoker  . Smokeless tobacco: Never Used  . Alcohol use No     Allergies   Cymbalta [duloxetine hcl] and Ferrous sulfate   Review of Systems Review of Systems  Skin: Positive for rash.  All other systems reviewed and are negative.    Physical Exam Triage Vital Signs ED Triage Vitals  Enc Vitals Group     BP 12/16/16 1438 (!) 156/81     Pulse --      Resp 12/16/16 1438 16     Temp 12/16/16 1438 98.1 F (36.7 C)     Temp Source 12/16/16 1438 Oral     SpO2 12/16/16 1438 98 %     Weight 12/16/16 1439 242 lb (109.8 kg)     Height 12/16/16 1439 5\' 2"  (1.575 m)     Head Circumference --      Peak Flow --      Pain Score 12/16/16 1440 2     Pain Loc --      Pain Edu? --      Excl. in GC? --    No data found.   Updated Vital Signs BP (!) 156/81 (BP Location: Left Wrist)   Temp 98.1 F (36.7 C) (Oral)   Resp 16   Ht 5\' 2"  (1.575 m)   Wt 242 lb (109.8 kg)   LMP 11/13/2016   SpO2 98%   BMI 44.26 kg/m   Visual Acuity Right Eye Distance:   Left Eye Distance:   Bilateral Distance:    Right Eye Near:   Left Eye Near:      Bilateral Near:     Physical Exam  Constitutional: She is oriented to person, place, and time. She appears well-developed  and well-nourished.  HENT:  Head: Normocephalic.  Musculoskeletal: Normal range of motion.  Neurological: She is alert and oriented to person, place, and time.  Skin: There is erythema.  Draining 5mm area right labia,  Firm area left labia, appearsto be a small psutule  Psychiatric: She has a normal mood and affect.  Nursing note and vitals reviewed.    UC Treatments / Results  Labs (all labs ordered are listed, but only abnormal results are displayed) Labs Reviewed - No data to display  EKG  EKG Interpretation None       Radiology No results found.  Procedures Procedures (including critical care time)  Medications Ordered in UC Medications - No data to display   Initial Impression / Assessment and Plan / UC Course  I have reviewed the triage vital signs and the nursing notes.  Pertinent labs & imaging results that were available during my care of the patient were reviewed by me and considered in my medical decision making (see chart for details).     Pt given rx for antibiotics.  Pt advised to follow up with her GYn for recheck in 2 weeks.  Final Clinical Impressions(s) / UC Diagnoses   Final diagnoses:  None    New Prescriptions New Prescriptions   DOXYCYCLINE (VIBRAMYCIN) 100 MG CAPSULE    Take 1 capsule (100 mg total) by mouth 2 (two) times daily.   An After Visit Summary was printed and given to the patient.   Controlled Substance Prescriptions  Controlled Substance Registry consulted? Not Applicable   Elson Areas, [place] 12/18/16 1536

## 2016-12-16 NOTE — Discharge Instructions (Signed)
Return if any problems.  Schedule to see your gynecologist to make sure area resolves

## 2016-12-16 NOTE — ED Triage Notes (Signed)
Patient states she has a few cysts on labia but feels she has an infected one on right labia.

## 2016-12-17 ENCOUNTER — Other Ambulatory Visit: Payer: Self-pay | Admitting: Physician Assistant

## 2016-12-17 DIAGNOSIS — M5412 Radiculopathy, cervical region: Secondary | ICD-10-CM

## 2017-02-17 ENCOUNTER — Other Ambulatory Visit: Payer: Self-pay | Admitting: Physician Assistant

## 2017-02-17 DIAGNOSIS — R059 Cough, unspecified: Secondary | ICD-10-CM

## 2017-02-17 DIAGNOSIS — J0100 Acute maxillary sinusitis, unspecified: Secondary | ICD-10-CM

## 2017-02-17 DIAGNOSIS — J4521 Mild intermittent asthma with (acute) exacerbation: Secondary | ICD-10-CM

## 2017-02-17 DIAGNOSIS — R05 Cough: Secondary | ICD-10-CM

## 2017-04-09 ENCOUNTER — Other Ambulatory Visit: Payer: Self-pay | Admitting: Physician Assistant

## 2017-04-09 DIAGNOSIS — M5412 Radiculopathy, cervical region: Secondary | ICD-10-CM

## 2017-07-10 ENCOUNTER — Other Ambulatory Visit: Payer: Self-pay | Admitting: Physician Assistant

## 2017-07-10 DIAGNOSIS — M5412 Radiculopathy, cervical region: Secondary | ICD-10-CM

## 2018-08-09 ENCOUNTER — Emergency Department
Admission: EM | Admit: 2018-08-09 | Discharge: 2018-08-09 | Disposition: A | Discharge status: Home / Self Care | Payer: Federal, State, Local not specified - PPO | Source: Home / Self Care | Attending: Family Medicine | Admitting: Family Medicine

## 2018-08-09 ENCOUNTER — Other Ambulatory Visit: Payer: Self-pay

## 2018-08-09 ENCOUNTER — Emergency Department (INDEPENDENT_AMBULATORY_CARE_PROVIDER_SITE_OTHER): Payer: Federal, State, Local not specified - PPO

## 2018-08-09 DIAGNOSIS — R51 Headache: Secondary | ICD-10-CM

## 2018-08-09 DIAGNOSIS — J069 Acute upper respiratory infection, unspecified: Secondary | ICD-10-CM | POA: Diagnosis not present

## 2018-08-09 DIAGNOSIS — R0989 Other specified symptoms and signs involving the circulatory and respiratory systems: Secondary | ICD-10-CM

## 2018-08-09 MED ORDER — PREDNISONE 20 MG PO TABS: ORAL_TABLET | ORAL | 0 refills | Status: DC

## 2018-08-09 NOTE — Discharge Instructions (Addendum)
Take plain guaifenesin (1200mg extended release tabs such as Mucinex) twice daily, with plenty of water, for cough and congestion.  May add Pseudoephedrine (30mg, one or two every 4 to 6 hours) for sinus congestion.  Get adequate rest.   May use Afrin nasal spray (or generic oxymetazoline) each morning for about 5 days and then discontinue.  Also recommend using saline nasal spray several times daily and saline nasal irrigation (AYR is a common brand).  Use Flonase nasal spray each morning after using Afrin nasal spray and saline nasal irrigation. Try warm salt water gargles for sore throat.  Stop all antihistamines for now, and other non-prescription cough/cold preparations. May take Delsym Cough Suppressant at bedtime for nighttime cough.  

## 2018-08-09 NOTE — ED Provider Notes (Signed)
[facility]    CSN: [id number] Arrival date & time: 08/09/18  7829     History   Chief Complaint Chief Complaint  Patient presents with  . Facial Pain  . Otalgia    both ears    HPI Rhonda Oneal is a 53 y.o. female.   About 3 weeks ago patient had sinus and ear pain that resolved with a course of Augmentin and antibiotic ear drops.  Her symptoms have now recurred with sinus congestion and post-nasal drainage.  She denies cough, and fevers, chills, and sweats     Past Medical History:  Diagnosis Date  . Allergy to mold   . Arthritis   . Asthma   . DERMATITIS, ATOPIC 02/09/2010   Qualifier: Diagnosis of  By: Koleen Nimrod MD, Dellis Filbert    . EDEMA LEG 03/22/2010   Qualifier: Diagnosis of  By: Koleen Nimrod MD, Dellis Filbert    . Fibromyalgia   . IBS (irritable bowel syndrome)   . Morbid obesity [facility])     Patient Active Problem List   Diagnosis Date Noted  . Acute foreign body of right ear canal 11/05/2016  . Cough 11/05/2016  . Mild intermittent asthma with exacerbation 11/05/2016  . Pain of left sacroiliac joint 04/18/2016  . Trochanteric bursitis of both hips 03/16/2016  . History of asthma 01/17/2016  . Acute non-recurrent maxillary sinusitis 12/15/2015  . Ischial bursitis 12/15/2015  . Mid back pain 03/18/2015  . 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 Cervical spinal stenosis 11/12/2012  . Irritable bowel syndrome 11/29/2011  . Morbid obesity ([place])   . ALLERGIC RHINITIS, SEASONAL 06/08/2010  . EDEMA LEG 03/22/2010  . DERMATITIS, ATOPIC 02/09/2010    Past Surgical History:  Procedure Laterality Date  . CESAREAN SECTION    . TONSILLECTOMY AND ADENOIDECTOMY    . WISDOM TOOTH EXTRACTION      OB History    Gravida  2   Para  2   Term      Preterm      AB      Living        SAB      TAB      Ectopic      Multiple      Live Births               Home Medications    Prior to Admission medications   Medication Sig  Start Date End Date Taking? Authorizing Provider  albuterol (PROVENTIL HFA;VENTOLIN HFA) 108 (90 Base) MCG/ACT inhaler Inhale 1-2 puffs into the lungs every 4 (four) hours as needed for wheezing or shortness of breath. 10/30/16   Breeback, Royetta Car, PA-C  AMBULATORY NON FORMULARY MEDICATION One nebulizer machine with accesories for mild persistent asthma 11/01/16   Breeback, Jade L, PA-C  benzonatate (TESSALON) 200 MG capsule Take 1 capsule (200 mg total) by mouth 2 (two) times daily as needed for cough. 11/05/16   Breeback, Jade L, PA-C  cetirizine (ZYRTEC) 10 MG tablet Take 10 mg by mouth daily.    [provider]  cyclobenzaprine (FLEXERIL) 10 MG tablet Take 0.5-1 tablets (5-10 mg total) by mouth 3 (three) times daily as needed for muscle spasms. 09/17/16   Emeterio Reeve, DO  doxycycline (VIBRAMYCIN) 100 MG capsule Take 1 capsule (100 mg total) by mouth 2 (two) times daily. 12/16/16   Fransico Meadow, PA-C  fluticasone (FLONASE) 50 MCG/ACT nasal spray  USE 1 SPRAY IN EACH NOSTRIL DAILY 06/05/16  [provider]  fluticasone (FLOVENT HFA) 220 MCG/ACT inhaler Inhale 2 puffs into the lungs 2 (two) times daily. 11/05/16   Breeback, Jade L, PA-C  gabapentin (NEURONTIN) 800 MG tablet One tab by mouth at breakfast, midday, 2 tabs at bedtime. 07/06/15   Laren BoomHommel, Sean, DO  HYDROcodone-acetaminophen (NORCO/VICODIN) 5-325 MG tablet Take by mouth. 10/21/16   [provider]  HYDROcodone-homatropine (HYCODAN) 5-1.5 MG/5ML syrup Take 5 mLs by mouth at bedtime as needed. 11/05/16   Breeback, Jade L, PA-C  ipratropium (ATROVENT) 0.06 % nasal spray Place 2 sprays into both nostrils 4 (four) times daily. 09/17/16   Sunnie NielsenAlexander, Natalie, DO  meloxicam (MOBIC) 15 MG tablet TAKE 1 TABLET (15 MG TOTAL) BY MOUTH DAILY. AS NEEDED FOR MUSCLE/JOINT PAIN 11/19/16   Tandy GawBreeback, Jade L, PA-C  metoprolol tartrate (LOPRESSOR) 25 MG tablet Take 25 mg by mouth every morning.    [provider]  naproxen  (NAPROSYN) 500 MG tablet Take by mouth. 10/25/16   [provider]  predniSONE (DELTASONE) 20 MG tablet Take one tab by mouth twice daily for 4 days, then one daily for 3 days. Take with food. 08/09/18   Lattie HawBeese,  A, MD  traMADol (ULTRAM) 50 MG tablet Take by mouth every 6 (six) hours as needed.    [provider]    Family History Family History  Problem Relation Age of Onset  . Diabetes Mother   . Cancer Mother        Lung  . Hyperlipidemia Father   . Breast cancer Maternal Aunt   . Ovarian cancer Paternal Aunt     Social History Social History   Tobacco Use  . Smoking status: Never Smoker  . Smokeless tobacco: Never Used  Substance Use Topics  . Alcohol use: No  . Drug use: No     Allergies   Cymbalta [duloxetine hcl] and Ferrous sulfate   Review of Systems Review of Systems No sore throat ]No cough No pleuritic pain No wheezing No nasal congestion + post-nasal drainage + sinus pain/pressure No itchy/red eyes ? earache No hemoptysis No SOB No fever/chills No nausea No vomiting No abdominal pain No diarrhea No urinary symptoms No skin rash No fatigue No myalgias No headache Used OTC meds without relief   Physical Exam Triage Vital Signs ED Triage Vitals [08/09/18 0950]  Enc Vitals Group     BP (!) 156/87     Pulse Rate 72     Resp 18     Temp 97.9 F (36.6 C)     Temp Source Oral     SpO2 98 %     Weight      Height      Head Circumference      Peak Flow      Pain Score 0     Pain Loc      Pain Edu?      Excl. in GC?    No data found.  Updated Vital Signs BP (!) 156/87 (BP Location: Right Arm)   Pulse 72   Temp 97.9 F (36.6 C) (Oral)   Resp 18   LMP 07/19/2018 (Within Days)   SpO2 98%   Visual Acuity Right Eye Distance:   Left Eye Distance:   Bilateral Distance:    Right Eye Near:   Left Eye Near:    Bilateral Near:     Physical Exam Nursing notes and Vital Signs reviewed. Appearance:  Patient  appears stated age, and in no acute  distress Eyes:  Pupils are equal, round, and reactive to light and accomodation.  Extraocular movement is intact.  Conjunctivae are not inflamed  Ears:  Canals normal.  Tympanic membranes normal.  Nose:  Mildly congested turbinates.  No sinus tenderness. Pharynx:  Normal Neck:  Supple.  Enlarged posterior/lateral nodes are palpated bilaterally, tender to palpation on the left.   Lungs:  Clear to auscultation.  Breath sounds are equal.  Moving air well. Heart:  Regular rate and rhythm without murmurs, rubs, or gallops.  Abdomen:  Nontender without masses or hepatosplenomegaly.  Bowel sounds are present.  No CVA or flank tenderness.  Extremities:  No edema.  Skin:  No rash present.    UC Treatments / Results  Labs (all labs ordered are listed, but only abnormal results are displayed) Labs Reviewed -   Tympanometry:  Right ear tympanogram normal; Left ear tympanogram is wide  EKG None  Radiology No results found.  Procedures Procedures (including critical care time)  Medications Ordered in UC Medications - No data to display  Initial Impression / Assessment and Plan / UC Course  I have reviewed the triage vital signs and the nursing notes.  Pertinent labs & imaging results that were available during my care of the patient were reviewed by me and considered in my medical decision making (see chart for details).    Suspect early viral URI.  There is no evidence of bacterial infection today.  Treat symptomatically for now  Begin prednisone burst/taper. Followup with Family Doctor if not improved in about 10 days.   Final Clinical Impressions(s) / UC Diagnoses   Final diagnoses:  Viral URI     Discharge Instructions     Take plain guaifenesin (1200mg  extended release tabs such as Mucinex) twice daily, with plenty of water, for cough and congestion.  May add Pseudoephedrine (30mg , one or two every 4 to 6 hours) for sinus congestion.  Get  adequate rest.   May use Afrin nasal spray (or generic oxymetazoline) each morning for about 5 days and then discontinue.  Also recommend using saline nasal spray several times daily and saline nasal irrigation (AYR is a common brand).  Use Flonase nasal spray each morning after using Afrin nasal spray and saline nasal irrigation. Try warm salt water gargles for sore throat.  Stop all antihistamines for now, and other non-prescription cough/cold preparations. May take Delsym Cough Suppressant at bedtime for nighttime cough.     ED Prescriptions    Medication Sig Dispense Auth. Provider   predniSONE (DELTASONE) 20 MG tablet Take one tab by mouth twice daily for 4 days, then one daily for 3 days. Take with food. 11 tablet Lattie HawBeese,  A, MD         Lattie HawBeese,  A, MD 08/16/18 [phone]

## 2018-08-09 NOTE — ED Triage Notes (Signed)
Pt c/o sinus and ear pain for several weeks. Was given amoxicillin script for 10 days, but after course completion, sxs have flared back up. Taking excedrin and flonase prn.

## 2018-08-11 ENCOUNTER — Telehealth: Payer: Self-pay

## 2018-08-11 NOTE — Telephone Encounter (Signed)
Pt called and said that she has had a bump come up inside her nose and wanted Dr Assunta Found to call in an antibiotic for her.  Called her back and left a message that Dr Assunta Found will not be here until Wednesday, and if she would need to be seen by PCP or her with the provider on today if she needs an antibiotics.

## 2019-01-04 ENCOUNTER — Encounter: Payer: Self-pay | Admitting: Emergency Medicine

## 2019-01-04 ENCOUNTER — Emergency Department
Admission: EM | Admit: 2019-01-04 | Discharge: 2019-01-04 | Disposition: A | Discharge status: Home / Self Care | Payer: Federal, State, Local not specified - PPO | Source: Home / Self Care

## 2019-01-04 ENCOUNTER — Other Ambulatory Visit: Payer: Self-pay

## 2019-01-04 DIAGNOSIS — R059 Cough, unspecified: Secondary | ICD-10-CM

## 2019-01-04 DIAGNOSIS — J0100 Acute maxillary sinusitis, unspecified: Secondary | ICD-10-CM | POA: Diagnosis not present

## 2019-01-04 DIAGNOSIS — J029 Acute pharyngitis, unspecified: Secondary | ICD-10-CM

## 2019-01-04 DIAGNOSIS — R05 Cough: Secondary | ICD-10-CM

## 2019-01-04 DIAGNOSIS — R0981 Nasal congestion: Secondary | ICD-10-CM

## 2019-01-04 DIAGNOSIS — L02419 Cutaneous abscess of limb, unspecified: Secondary | ICD-10-CM

## 2019-01-04 MED ORDER — DOXYCYCLINE HYCLATE 100 MG PO CAPS: 100.0000 mg | ORAL_CAPSULE | Freq: Two times a day (BID) | ORAL | 0 refills | Status: DC

## 2019-01-04 NOTE — ED Triage Notes (Signed)
Mom is here with daughter for covid testing. Daughter works @ assisted living; 3 patients pos covid. C/o dry cough, post nasal drip and pain behind both eyes. Taking allergy meds: Cetirizine,flonase spray.

## 2019-01-04 NOTE — ED Provider Notes (Signed)
[name]    CSN: [id number] Arrival date & time: 01/04/19  9628      History   Chief Complaint Chief Complaint  Patient presents with  . Allergic Rhinitis     covid testing; daughhter pos contact exposure    HPI Rhonda Oneal is a 53 y.o. female.   HPI  Rhonda Oneal is a 53 y.o. female presenting to UC with c/o 4-5 days of nasal congestion and sinus pain and pressure c/w prior sinus infections. Associated dry cough and post-nasal drip.  She has taken cetirizine and used her Flonase with mild relief. She also notes her daughter is here to be tested for Covid after developing fatigue and mild chest congestion after orienting at a nursing facility this past week.  No direct contact with a Covid positive person.  Pt also reports bilateral underarm sores that have also been present for about 4-5 days. Pain is aching and sore. No drainage. She has not needed any boils to be lanced in the past. Denies fever, chills, n/v/d.   Past Medical History:  Diagnosis Date  . Allergy to mold   . Arthritis   . Asthma   . DERMATITIS, ATOPIC 02/09/2010   Qualifier: Diagnosis of  By: Orson Aloe MD, Tinnie Gens    . EDEMA LEG 03/22/2010   Qualifier: Diagnosis of  By: Orson Aloe MD, Tinnie Gens    . Fibromyalgia   . IBS (irritable bowel syndrome)   . Morbid obesity [facility])     Patient Active Problem List   Diagnosis Date Noted  . Acute foreign body of right ear canal 11/05/2016  . Cough 11/05/2016  . Mild intermittent asthma with exacerbation 11/05/2016  . Pain of left sacroiliac joint 04/18/2016  . Trochanteric bursitis of both hips 03/16/2016  . History of asthma 01/17/2016  . Acute non-recurrent maxillary sinusitis 12/15/2015  . Ischial bursitis 12/15/2015  . Mid back pain 03/18/2015  . 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 Cervical spinal stenosis 11/12/2012  . Irritable bowel syndrome 11/29/2011  . Morbid obesity (HCC)   . ALLERGIC RHINITIS, SEASONAL  06/08/2010  . EDEMA LEG 03/22/2010  . DERMATITIS, ATOPIC 02/09/2010    Past Surgical History:  Procedure Laterality Date  . CESAREAN SECTION    . TONSILLECTOMY AND ADENOIDECTOMY    . WISDOM TOOTH EXTRACTION      OB History    Gravida  2   Para  2   Term      Preterm      AB      Living        SAB      TAB      Ectopic      Multiple      Live Births               Home Medications    Prior to Admission medications   Medication Sig Start Date End Date Taking? Authorizing Provider  albuterol (PROVENTIL HFA;VENTOLIN HFA) 108 (90 Base) MCG/ACT inhaler Inhale 1-2 puffs into the lungs every 4 (four) hours as needed for wheezing or shortness of breath. 10/30/16   Breeback, Lonna Cobb, PA-C  AMBULATORY NON FORMULARY MEDICATION One nebulizer machine with accesories for mild persistent asthma 11/01/16   Breeback, Jade L, PA-C  benzonatate (TESSALON) 200 MG capsule Take 1 capsule (200 mg total) by mouth 2 (two) times daily as needed for cough. 11/05/16   Breeback, Jade L, PA-C  cetirizine (ZYRTEC) 10 MG tablet Take 10  mg by mouth daily.    [provider]  cyclobenzaprine (FLEXERIL) 10 MG tablet Take 0.5-1 tablets (5-10 mg total) by mouth 3 (three) times daily as needed for muscle spasms. 09/17/16   Emeterio Reeve, DO  doxycycline (VIBRAMYCIN) 100 MG capsule Take 1 capsule (100 mg total) by mouth 2 (two) times daily. One po bid x 7 days 01/04/19   Noe Gens, PA-C  fluticasone [facility]) 50 MCG/ACT nasal spray  USE 1 SPRAY IN [facility] NOSTRIL DAILY 06/05/16   [provider]  fluticasone (FLOVENT HFA) 220 MCG/ACT inhaler Inhale 2 puffs into the lungs 2 (two) times daily. 11/05/16   Breeback, Jade L, PA-C  gabapentin (NEURONTIN) 800 MG tablet One tab by mouth at breakfast, midday, 2 tabs at bedtime. 07/06/15   Marcial Pacas, DO  HYDROcodone-acetaminophen (NORCO/VICODIN) 5-325 MG tablet Take by mouth. 10/21/16   [provider]  HYDROcodone-homatropine  (HYCODAN) 5-1.5 MG/5ML syrup Take 5 mLs by mouth at bedtime as needed. 11/05/16   Breeback, Jade L, PA-C  ipratropium (ATROVENT) 0.06 % nasal spray Place 2 sprays into both nostrils 4 (four) times daily. 09/17/16   Emeterio Reeve, DO  meloxicam (MOBIC) 15 MG tablet TAKE 1 TABLET (15 MG TOTAL) BY MOUTH DAILY. AS NEEDED FOR MUSCLE/JOINT PAIN 11/19/16   Iran Planas L, PA-C  metoprolol tartrate (LOPRESSOR) 25 MG tablet Take 25 mg by mouth every morning.    [provider]  naproxen (NAPROSYN) 500 MG tablet Take by mouth. 10/25/16   [provider]  predniSONE (DELTASONE) 20 MG tablet Take one tab by mouth twice daily for 4 days, then one daily for 3 days. Take with food. 08/09/18   Kandra Nicolas, MD  traMADol (ULTRAM) 50 MG tablet Take by mouth every 6 (six) hours as needed.    [provider]    Family History Family History  Problem Relation Age of Onset  . Diabetes Mother   . Cancer Mother        Lung  . Hyperlipidemia Father   . Breast cancer Maternal Aunt   . Ovarian cancer Paternal Aunt     Social History Social History   Tobacco Use  . Smoking status: Never Smoker  . Smokeless tobacco: Never Used  Substance Use Topics  . Alcohol use: No  . Drug use: No     Allergies   Cymbalta [duloxetine hcl] and Ferrous sulfate   Review of Systems Review of Systems  Constitutional: Negative for chills and fever.  HENT: Positive for congestion, postnasal drip, sinus pressure and sinus pain. Negative for ear pain, sore throat, trouble swallowing and voice change.   Respiratory: Positive for cough. Negative for shortness of breath.   Cardiovascular: Negative for chest pain and palpitations.  Gastrointestinal: Negative for abdominal pain, diarrhea, nausea and vomiting.  Musculoskeletal: Negative for arthralgias, back pain and myalgias.  Skin: Negative for color change, rash and wound.  Neurological: Positive for headaches. Negative for dizziness and  light-headedness.     Physical Exam Triage Vital Signs ED Triage Vitals  Enc Vitals Group     BP 01/04/19 1006 (!) 164/89     Pulse Rate 01/04/19 1006 90     Resp --      Temp 01/04/19 1006 97.9 F (36.6 C)     Temp Source 01/04/19 1006 Oral     SpO2 01/04/19 1006 100 %     Weight 01/04/19 1007 228 lb 12.8 oz (103.8 kg)     Height 01/04/19 1007  5\' 1"  (1.549 m)     Head Circumference --      Peak Flow --      Pain Score 01/04/19 1006 4     Pain Loc --      Pain Edu? --      Excl. in GC? --    No data found.  Updated Vital Signs BP (!) 164/89 (BP Location: Right Arm)   Pulse 90   Temp 97.9 F (36.6 C) (Oral)   Ht 5\' 1"  (1.549 m)   Wt 228 lb 12.8 oz (103.8 kg)   SpO2 100%   BMI 43.23 kg/m   Visual Acuity Right Eye Distance:   Left Eye Distance:   Bilateral Distance:    Right Eye Near:   Left Eye Near:    Bilateral Near:     Physical Exam Vitals signs and nursing note reviewed.  Constitutional:      Appearance: Normal appearance. She is well-developed.  HENT:     Head: Normocephalic and atraumatic.     Right Ear: Tympanic membrane and ear canal normal.     Left Ear: Tympanic membrane and ear canal normal.     Nose:     Right Sinus: Maxillary sinus tenderness present. No frontal sinus tenderness.     Left Sinus: Maxillary sinus tenderness present. No frontal sinus tenderness.     Mouth/Throat:     Lips: Pink.     Mouth: Mucous membranes are moist.     Pharynx: Oropharynx is clear. Uvula midline.  Neck:     Musculoskeletal: Normal range of motion and neck supple.  Cardiovascular:     Rate and Rhythm: Normal rate and regular rhythm.  Pulmonary:     Effort: Pulmonary effort is normal. No respiratory distress.     Breath sounds: Normal breath sounds.  Musculoskeletal: Normal range of motion.  Skin:    General: Skin is warm and dry.     Findings: No erythema.     Comments: Bilateral axillary areas: two 3-714mm tender nodules, indurated. No fluctuance. No  erythema or warmth. No red streaking.   Neurological:     Mental Status: She is alert and oriented to person, place, and time.  Psychiatric:        Behavior: Behavior normal.      UC Treatments / Results  Labs (all labs ordered are listed, but only abnormal results are displayed) Labs Reviewed  SARS-COV-2 RNA,(COVID-19) QUALITATIVE NAAT    EKG   Radiology No results found.  Procedures Procedures (including critical care time)  Medications Ordered in UC Medications - No data to display  Initial Impression / Assessment and Plan / UC Course  I have reviewed the triage vital signs and the nursing notes.  Pertinent labs & imaging results that were available during my care of the patient were reviewed by me and considered in my medical decision making (see chart for details).     Sinus tenderness noted on exam Will tx for early sinusitis as well as suspected early axillary abscesses No indication for I&D at this time. Due to family member here today being tested for Covid, will send Covid test for pt AVS provided  Final Clinical Impressions(s) / UC Diagnoses   Final diagnoses:  Acute non-recurrent maxillary sinusitis  Axillary abscess  Sinus congestion  Sore throat  Cough     Discharge Instructions      You may take 500mg  acetaminophen every 4-6 hours or in combination with ibuprofen 400-600mg  every 6-8 hours  as needed for pain, inflammation, and fever.  Be sure to well hydrated with clear liquids and get at least 8 hours of sleep at night, preferably more while sick.   Please follow up with family medicine in 1 week if needed.  Please take antibiotics as prescribed and be sure to complete entire course even if you start to feel better to ensure infection does not come back.      ED Prescriptions    Medication Sig Dispense Auth. Provider   doxycycline (VIBRAMYCIN) 100 MG capsule Take 1 capsule (100 mg total) by mouth 2 (two) times daily. One po bid x 7  days 14 capsule Lurene Shadow, [place]     PDMP not reviewed this encounter.   Lurene Shadow, PA-C 01/07/19 1046

## 2019-01-04 NOTE — Discharge Instructions (Signed)
You may take 500mg acetaminophen every 4-6 hours or in combination with ibuprofen 400-600mg every 6-8 hours as needed for pain, inflammation, and fever. ° °Be sure to well hydrated with clear liquids and get at least 8 hours of sleep at night, preferably more while sick.  ° °Please follow up with family medicine in 1 week if needed. ° °Please take antibiotics as prescribed and be sure to complete entire course even if you start to feel better to ensure infection does not come back. ° °

## 2019-01-06 LAB — SARS-COV-2 RNA,(COVID-19) QUALITATIVE NAAT: SARS CoV2 RNA: NOT DETECTED

## 2019-04-01 ENCOUNTER — Encounter: Payer: Federal, State, Local not specified - PPO | Admitting: Sports Medicine

## 2019-04-02 ENCOUNTER — Ambulatory Visit (INDEPENDENT_AMBULATORY_CARE_PROVIDER_SITE_OTHER): Payer: Federal, State, Local not specified - PPO | Admitting: Sports Medicine

## 2019-04-02 ENCOUNTER — Other Ambulatory Visit: Payer: Self-pay

## 2019-04-02 ENCOUNTER — Encounter: Payer: Self-pay | Admitting: Sports Medicine

## 2019-04-02 DIAGNOSIS — M65312 Trigger thumb, left thumb: Secondary | ICD-10-CM | POA: Insufficient documentation

## 2019-04-02 NOTE — Assessment & Plan Note (Signed)
For over a month this pleasant 54 year old female has had painful triggering sensations on the volar aspect of her left thumb. She saw her PCP and was placed in a thumb spica brace, NSAIDs initiated. Unfortunately she continues to have discomfort and is referred to me for further evaluation. I can feel a palpable flexor tendon nodule at the flexor pollicis longus tendon, today we placed an injection into the flexor pollicis longus tendon sheath. Adding formal physical therapy and some home hand exercises, she will continue her spica brace for another week, return to see me in a month.

## 2019-04-02 NOTE — Progress Notes (Signed)
    Procedures performed today:    Procedure: Real-time Ultrasound Guided injection of the left flexor pollicis longus tendon sheath Device: Samsung HS60  Verbal informed consent obtained.  Time-out conducted.  Noted no overlying erythema, induration, or other signs of local infection.  Skin prepped in a sterile fashion.  Local anesthesia: Topical Ethyl chloride.  With sterile technique and under real time ultrasound guidance:  0.5 cc Kenalog 40, 0.5 cc lidocaine injected into the flexor pollicis longus tendon sheath  completed without difficulty  Pain immediately resolved suggesting accurate placement of the medication.  Advised to call if fevers/chills, erythema, induration, drainage, or persistent bleeding.  Images permanently stored and available for review in the ultrasound unit.  Impression: Technically successful ultrasound guided injection.  Independent interpretation of tests performed by another provider:   None.  Impression and Recommendations:    Trigger thumb, left thumb For over a month this pleasant 54 year old female has had painful triggering sensations on the volar aspect of her left thumb. She saw her PCP and was placed in a thumb spica brace, NSAIDs initiated. Unfortunately she continues to have discomfort and is referred to me for further evaluation. I can feel a palpable flexor tendon nodule at the flexor pollicis longus tendon, today we placed an injection into the flexor pollicis longus tendon sheath. Adding formal physical therapy and some home hand exercises, she will continue her spica brace for another week, return to see me in a month.    ___________________________________________ Ihor Austin. Benjamin Stain, M.D., ABFM., CAQSM. Primary Care and Sports Medicine  [facility]  Adjunct Instructor of Family Medicine  [employer]

## 2019-04-30 ENCOUNTER — Emergency Department
Admission: EM | Admit: 2019-04-30 | Discharge: 2019-04-30 | Disposition: A | Discharge status: Home / Self Care | Payer: Federal, State, Local not specified - PPO | Source: Home / Self Care

## 2019-04-30 ENCOUNTER — Emergency Department (INDEPENDENT_AMBULATORY_CARE_PROVIDER_SITE_OTHER): Payer: Federal, State, Local not specified - PPO

## 2019-04-30 ENCOUNTER — Ambulatory Visit: Payer: Federal, State, Local not specified - PPO | Admitting: Sports Medicine

## 2019-04-30 ENCOUNTER — Other Ambulatory Visit: Payer: Self-pay

## 2019-04-30 DIAGNOSIS — J069 Acute upper respiratory infection, unspecified: Secondary | ICD-10-CM | POA: Diagnosis not present

## 2019-04-30 DIAGNOSIS — R05 Cough: Secondary | ICD-10-CM

## 2019-04-30 DIAGNOSIS — R0602 Shortness of breath: Secondary | ICD-10-CM

## 2019-04-30 DIAGNOSIS — Z20822 Contact with and (suspected) exposure to covid-19: Secondary | ICD-10-CM

## 2019-04-30 DIAGNOSIS — J9801 Acute bronchospasm: Secondary | ICD-10-CM

## 2019-04-30 MED ORDER — PREDNISONE 20 MG PO TABS: ORAL_TABLET | ORAL | 0 refills | Status: DC

## 2019-04-30 NOTE — Discharge Instructions (Signed)
  You may take 500mg  acetaminophen every 4-6 hours or in combination with ibuprofen 400-600mg  every 6-8 hours as needed for pain, inflammation, and fever.  Be sure to well hydrated with clear liquids and get at least 8 hours of sleep at night, preferably more while sick.   Please follow up with family medicine in 1 week if needed.  Call 911 or go to the hospital if symptoms worsening- chest pain, difficulty breathing, passing out.

## 2019-04-30 NOTE — ED Provider Notes (Signed)
[name]    CSN: [id number] Arrival date & time: 04/30/19  1210      History   Chief Complaint Chief Complaint  Patient presents with  . Cough    HPI Rhonda Oneal is a 54 y.o. female.   HPI  Rhonda Oneal is a 54 y.o. female presenting to UC with c/o dry hacking cough that started about 2 weeks ago. Cough is worse when lying down, especially at night.  She occasionally coughs up small amounts of clear phlegm.  Pt was seen by her PCP on 04/14/19, dx with a sinus infection and prescribed azithromycin.  Pt states she was then seen for an ear infection and was prescribed clindamycin. She completed just 4 days of the antibiotic before stopping due to GI upset including diarrhea.  She stopped the medication about 2 days ago.  She has been trying OTC Flonase, cetirizine, tessalon, albuterol inhaler, and sudafed w/o relief.  No known sick contacts or recent travel. Denies chest pain or SOB.   Past Medical History:  Diagnosis Date  . Allergy to mold   . Arthritis   . Asthma   . DERMATITIS, ATOPIC 02/09/2010   Qualifier: Diagnosis of  By: Orson Aloe MD, Tinnie Gens    . EDEMA LEG 03/22/2010   Qualifier: Diagnosis of  By: Orson Aloe MD, Tinnie Gens    . Fibromyalgia   . IBS (irritable bowel syndrome)   . Morbid obesity [facility])     Patient Active Problem List   Diagnosis Date Noted  . Trigger thumb, left thumb 04/02/2019  . Acute foreign body of right ear canal 11/05/2016  . Cough 11/05/2016  . Mild intermittent asthma with exacerbation 11/05/2016  . Pain of left sacroiliac joint 04/18/2016  . Trochanteric bursitis of both hips 03/16/2016  . History of asthma 01/17/2016  . Acute non-recurrent maxillary sinusitis 12/15/2015  . Ischial bursitis 12/15/2015  . Mid back pain 03/18/2015  . 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 Cervical spinal stenosis 11/12/2012  . Irritable bowel syndrome 11/29/2011  . Morbid obesity (HCC)   . ALLERGIC RHINITIS, SEASONAL  06/08/2010  . EDEMA LEG 03/22/2010  . DERMATITIS, ATOPIC 02/09/2010    Past Surgical History:  Procedure Laterality Date  . CESAREAN SECTION    . TONSILLECTOMY AND ADENOIDECTOMY    . WISDOM TOOTH EXTRACTION      OB History    Gravida  2   Para  2   Term      Preterm      AB      Living        SAB      TAB      Ectopic      Multiple      Live Births               Home Medications    Prior to Admission medications   Medication Sig Start Date End Date Taking? Authorizing Provider  AMBULATORY NON FORMULARY MEDICATION One nebulizer machine with accesories for mild persistent asthma 11/01/16   Breeback, Jade L, PA-C  baclofen (LIORESAL) 10 MG tablet TAKE 1 TABLET BY MOUTH EVERY DAY 12/04/18   [provider]  BYSTOLIC 10 MG tablet Take 10 mg by mouth daily. 01/31/19   [provider]  cyclobenzaprine (FLEXERIL) 10 MG tablet Take 0.5-1 tablets (5-10 mg total) by mouth 3 (three) times daily as needed for muscle spasms. 09/17/16   Sunnie Nielsen, DO  gabapentin (NEURONTIN) 800 MG tablet  One tab by mouth at breakfast, midday, 2 tabs at bedtime. Patient taking differently: 600 mg 4 (four) times daily. One tab by mouth at breakfast, midday, 2 tabs at bedtime. 07/06/15   Hommel, Gregary Signs, DO  ibuprofen (ADVIL) 800 MG tablet TAKE 1 TABLET BY MOUTH EVERY 8 HOURS AS NEEDED FOR PAIN 01/07/19   [provider]  pravastatin (PRAVACHOL) 20 MG tablet Take 20 mg by mouth at bedtime. 03/19/19   [provider]  predniSONE (DELTASONE) 20 MG tablet 3 tabs po day one, then 2 po daily x 4 days 04/30/19   Lurene Shadow, PA-C  traMADol (ULTRAM) 50 MG tablet Take by mouth every 6 (six) hours as needed.    [provider]    Family History Family History  Problem Relation Age of Onset  . Diabetes Mother   . Cancer Mother        Lung  . Hyperlipidemia Father   . Breast cancer Maternal Aunt   . Ovarian cancer Paternal Aunt     Social  History Social History   Tobacco Use  . Smoking status: Never Smoker  . Smokeless tobacco: Never Used  Substance Use Topics  . Alcohol use: No  . Drug use: No     Allergies   Cymbalta [duloxetine hcl] and Ferrous sulfate   Review of Systems Review of Systems  Constitutional: Negative for chills and fever.  HENT: Positive for congestion (mild). Negative for ear pain, sore throat, trouble swallowing and voice change.   Respiratory: Positive for cough. Negative for shortness of breath.   Cardiovascular: Negative for chest pain and palpitations.  Gastrointestinal: Negative for abdominal pain, diarrhea, nausea and vomiting.  Musculoskeletal: Negative for arthralgias, back pain and myalgias.  Skin: Negative for rash.     Physical Exam Triage Vital Signs ED Triage Vitals  Enc Vitals Group     BP 04/30/19 1236 134/87     Pulse Rate 04/30/19 1236 76     Resp 04/30/19 1236 16     Temp 04/30/19 1236 99.2 F (37.3 C)     Temp Source 04/30/19 1236 Oral     SpO2 04/30/19 1236 99 %     Weight 04/30/19 1234 237 lb (107.5 kg)     Height 04/30/19 1234 5\' 1"  (1.549 m)     Head Circumference --      Peak Flow --      Pain Score 04/30/19 1233 4     Pain Loc --      Pain Edu? --      Excl. in GC? --    No data found.  Updated Vital Signs BP 134/87 (BP Location: Right Arm)   Pulse 76   Temp 99.2 F (37.3 C) (Oral)   Resp 16   Ht 5\' 1"  (1.549 m)   Wt 237 lb (107.5 kg)   SpO2 99%   BMI 44.78 kg/m   Visual Acuity Right Eye Distance:   Left Eye Distance:   Bilateral Distance:    Right Eye Near:   Left Eye Near:    Bilateral Near:     Physical Exam Vitals and nursing note reviewed.  Constitutional:      Appearance: Normal appearance. She is well-developed.  HENT:     Head: Normocephalic and atraumatic.     Right Ear: Tympanic membrane and ear canal normal.     Left Ear: Tympanic membrane and ear canal normal.     Nose: Nose normal.     Right  Sinus: No maxillary  sinus tenderness or frontal sinus tenderness.     Left Sinus: No maxillary sinus tenderness or frontal sinus tenderness.     Mouth/Throat:     Lips: Pink.     Mouth: Mucous membranes are moist.     Pharynx: Oropharynx is clear. Uvula midline.  Cardiovascular:     Rate and Rhythm: Normal rate and regular rhythm.  Pulmonary:     Effort: Pulmonary effort is normal. No respiratory distress.     Breath sounds: Normal breath sounds. No stridor. No wheezing, rhonchi or rales.     Comments: Dry hacking cough during exam. Mildly SOB at times but able to speak in full sentences between coughing spells.  Musculoskeletal:        General: Normal range of motion.     Cervical back: Normal range of motion.  Skin:    General: Skin is warm and dry.  Neurological:     Mental Status: She is alert and oriented to person, place, and time.  Psychiatric:        Behavior: Behavior normal.      UC Treatments / Results  Labs (all labs ordered are listed, but only abnormal results are displayed) Labs Reviewed  NOVEL CORONAVIRUS, NAA    EKG   Radiology DG Chest 2 View  Result Date: 04/30/2019 CLINICAL DATA:  Persistent cough and shortness of breath. EXAM: CHEST - 2 VIEW COMPARISON:  November 05, 2016 FINDINGS: The heart size and mediastinal contours are within normal limits. Both lungs are clear. The visualized skeletal structures are unremarkable. IMPRESSION: No active cardiopulmonary disease. Electronically Signed   By: Abelardo Diesel M.D.   On: 04/30/2019 13:21    Procedures Procedures (including critical care time)  Medications Ordered in UC Medications - No data to display  Initial Impression / Assessment and Plan / UC Course  I have reviewed the triage vital signs and the nursing notes.  Pertinent labs & imaging results that were available during my care of the patient were reviewed by me and considered in my medical decision making (see chart for details).     Reviewed CXR with  pt No evidence of bacterial infection at this time Will test for covid given pandemic and severity of cough. Will tx cough with steroids, she may also continue use of albuterol at home AVS provided  Final Clinical Impressions(s) / UC Diagnoses   Final diagnoses:  Viral URI with cough  Bronchospasm  Suspected COVID-19 virus infection     Discharge Instructions      You may take 500mg  acetaminophen every 4-6 hours or in combination with ibuprofen 400-600mg  every 6-8 hours as needed for pain, inflammation, and fever.  Be sure to well hydrated with clear liquids and get at least 8 hours of sleep at night, preferably more while sick.   Please follow up with family medicine in 1 week if needed.  Call 911 or go to the hospital if symptoms worsening- chest pain, difficulty breathing, passing out.     ED Prescriptions    Medication Sig Dispense Auth. Provider   predniSONE (DELTASONE) 20 MG tablet 3 tabs po day one, then 2 po daily x 4 days 11 tablet Noe Gens, PA-C     PDMP not reviewed this encounter.   Noe Gens, [place] 04/30/19 1506

## 2019-04-30 NOTE — ED Triage Notes (Signed)
Patient presents to Urgent Care with complaints of dry cough since two weeks ago. Patient reports the coughing is worst when she lays flat, occasionally she will cough up phlegm but not all the time. Patient was recently given clindamycin as treatment for an ear infection but she did not like the way it made her feel, so she stopped taking the abx before it was completed. Pt has been using flonase otc with no improvement as well as cetirizine and sudafed.

## 2019-05-01 LAB — NOVEL CORONAVIRUS, NAA: SARS-CoV-2, NAA: NOT DETECTED

## 2019-05-07 ENCOUNTER — Emergency Department (INDEPENDENT_AMBULATORY_CARE_PROVIDER_SITE_OTHER)
Admission: EM | Admit: 2019-05-07 | Discharge: 2019-05-07 | Disposition: A | Discharge status: Home / Self Care | Payer: Federal, State, Local not specified - PPO | Source: Home / Self Care | Attending: Family Medicine | Admitting: Family Medicine

## 2019-05-07 ENCOUNTER — Other Ambulatory Visit: Payer: Self-pay

## 2019-05-07 DIAGNOSIS — M5412 Radiculopathy, cervical region: Secondary | ICD-10-CM

## 2019-05-07 DIAGNOSIS — J0101 Acute recurrent maxillary sinusitis: Secondary | ICD-10-CM

## 2019-05-07 MED ORDER — AMOXICILLIN 875 MG PO TABS: 875.0000 mg | ORAL_TABLET | Freq: Two times a day (BID) | ORAL | 0 refills | Status: DC

## 2019-05-07 NOTE — Discharge Instructions (Addendum)
May take Pseudoephedrine (30mg , one or two every 4 to 6 hours) for sinus congestion.     Continue using saline nasal spray several times daily and saline nasal irrigation (AYR is a common brand).  Use Flonase nasal spray each morning after using saline nasal irrigation.

## 2019-05-07 NOTE — ED Provider Notes (Signed)
[name]    CSN: [id number] Arrival date & time: 05/07/19  1728      History   Chief Complaint Chief Complaint  Patient presents with  . Facial Pain  . Otalgia    HPI Rhonda Oneal is a 54 y.o. female.   Patient was treated for persistent URI symptoms one week ago with prednisone.  Her cough resolved but she complains of continued ear pressure and bilateral facial pain.  She has has had only mild nasal congestion, using saline nasal spray, Flonase, and loratadine.  She complains of pain in her right neck behind her right ear extending inferiorly.  She denies fevers, chills, and sweats.  Review of past records reveals that patient had significant right cervical radiculopathy in 2018, treated by Dr. Rodney Langton with C6-C7 interlaminar epidural  The history is provided by the patient.    Past Medical History:  Diagnosis Date  . Allergy to mold   . Arthritis   . Asthma   . DERMATITIS, ATOPIC 02/09/2010   Qualifier: Diagnosis of  By: Orson Aloe MD, Tinnie Gens    . EDEMA LEG 03/22/2010   Qualifier: Diagnosis of  By: Orson Aloe MD, Tinnie Gens    . Fibromyalgia   . IBS (irritable bowel syndrome)   . Morbid obesity [facility])     Patient Active Problem List   Diagnosis Date Noted  . Trigger thumb, left thumb 04/02/2019  . Acute foreign body of right ear canal 11/05/2016  . Cough 11/05/2016  . Mild intermittent asthma with exacerbation 11/05/2016  . Pain of left sacroiliac joint 04/18/2016  . Trochanteric bursitis of both hips 03/16/2016  . History of asthma 01/17/2016  . Acute non-recurrent maxillary sinusitis 12/15/2015  . Ischial bursitis 12/15/2015  . Mid back pain 03/18/2015  . 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 Cervical spinal stenosis 11/12/2012  . Irritable bowel syndrome 11/29/2011  . Morbid obesity (HCC)   . ALLERGIC RHINITIS, SEASONAL 06/08/2010  . EDEMA LEG 03/22/2010  . DERMATITIS, ATOPIC 02/09/2010    Past Surgical History:    Procedure Laterality Date  . CESAREAN SECTION    . TONSILLECTOMY AND ADENOIDECTOMY    . WISDOM TOOTH EXTRACTION      OB History    Gravida  2   Para  2   Term      Preterm      AB      Living        SAB      TAB      Ectopic      Multiple      Live Births               Home Medications    Prior to Admission medications   Medication Sig Start Date End Date Taking? Authorizing Provider  AMBULATORY NON FORMULARY MEDICATION One nebulizer machine with accesories for mild persistent asthma 11/01/16   Breeback, Jade L, PA-C  amoxicillin (AMOXIL) 875 MG tablet Take 1 tablet (875 mg total) by mouth 2 (two) times daily. 05/07/19   Lattie Haw, MD  baclofen (LIORESAL) 10 MG tablet TAKE 1 TABLET BY MOUTH EVERY DAY 12/04/18   [provider]  BYSTOLIC 10 MG tablet Take 10 mg by mouth daily. 01/31/19   [provider]  cyclobenzaprine (FLEXERIL) 10 MG tablet Take 0.5-1 tablets (5-10 mg total) by mouth 3 (three) times daily as needed for muscle spasms. 09/17/16   Sunnie Nielsen, DO  gabapentin (NEURONTIN) 800 MG tablet  One tab by mouth at breakfast, midday, 2 tabs at bedtime. Patient taking differently: 600 mg 4 (four) times daily. One tab by mouth at breakfast, midday, 2 tabs at bedtime. 07/06/15   Hommel, Hilliard Clark, DO  ibuprofen (ADVIL) 800 MG tablet TAKE 1 TABLET BY MOUTH EVERY 8 HOURS AS NEEDED FOR PAIN 01/07/19   [provider]  pravastatin (PRAVACHOL) 20 MG tablet Take 20 mg by mouth at bedtime. 03/19/19   [provider]  predniSONE (DELTASONE) 20 MG tablet 3 tabs po day one, then 2 po daily x 4 days 04/30/19   Noe Gens, PA-C  traMADol (ULTRAM) 50 MG tablet Take by mouth every 6 (six) hours as needed.    [provider]    Family History Family History  Problem Relation Age of Onset  . Diabetes Mother   . Cancer Mother        Lung  . Hyperlipidemia Father   . Breast cancer Maternal Aunt   . Ovarian cancer  Paternal Aunt     Social History Social History   Tobacco Use  . Smoking status: Never Smoker  . Smokeless tobacco: Never Used  Substance Use Topics  . Alcohol use: No  . Drug use: No     Allergies   Cymbalta [duloxetine hcl] and Ferrous sulfate   Review of Systems Review of Systems No sore throat No cough No pleuritic pain No wheezing + nasal congestion ? post-nasal drainage + sinus pain/pressure No itchy/red eyes + bilateral earache No hemoptysis No SOB No fever/chills No nausea No vomiting No abdominal pain No diarrhea No urinary symptoms No skin rash No fatigue No myalgias + right neck pain No headache Used OTC meds without relief   Physical Exam Triage Vital Signs ED Triage Vitals  Enc Vitals Group     BP 05/07/19 1741 137/69     Pulse Rate 05/07/19 1741 72     Resp 05/07/19 1741 20     Temp 05/07/19 1741 (!) 97.4 F (36.3 C)     Temp src --      SpO2 05/07/19 1741 100 %     Weight 05/07/19 1743 235 lb (106.6 kg)     Height 05/07/19 1743 5\' 1"  (1.549 m)     Head Circumference --      Peak Flow --      Pain Score 05/07/19 1742 6     Pain Loc --      Pain Edu? --      Excl. in [place]? --    No data found.  Updated Vital Signs BP 137/69 (BP Location: Right Arm)   Pulse 72   Temp (!) 97.4 F (36.3 C)   Resp 20   Ht 5\' 1"  (1.549 m)   Wt 106.6 kg   LMP 04/17/2019   SpO2 100%   BMI 44.40 kg/m   Visual Acuity Right Eye Distance:   Left Eye Distance:   Bilateral Distance:    Right Eye Near:   Left Eye Near:    Bilateral Near:     Physical Exam Vitals and nursing note reviewed.  Constitutional:      General: She is not in acute distress. HENT:     Head: Normocephalic.     Right Ear: Tympanic membrane, ear canal and external ear normal.     Left Ear: Tympanic membrane, ear canal and external ear normal.     Ears:     Comments: No significant TMJ tenderness bilaterally  Nose: Congestion present.     Mouth/Throat:     Pharynx:  Oropharynx is clear.  Eyes:     Conjunctiva/sclera: Conjunctivae normal.     Pupils: Pupils are equal, round, and reactive to light.  Neck:      Comments: Right neck tenderness to palpation as noted on diagram. Symptoms are exacerbated by head rotation to the right. Cardiovascular:     Rate and Rhythm: Normal rate.  Pulmonary:     Effort: Pulmonary effort is normal.  Musculoskeletal:     Cervical back: Tenderness present.  Lymphadenopathy:     Cervical: No cervical adenopathy.  Skin:    General: Skin is warm and dry.  Neurological:     Mental Status: She is alert.      UC Treatments / Results  Labs (all labs ordered are listed, but only abnormal results are displayed) Labs Reviewed -   Tympanometry:  Right ear tympanogram normal; Left ear tympanogram normal  EKG   Radiology No results found.  Procedures Procedures (including critical care time)  Medications Ordered in UC Medications - No data to display  Initial Impression / Assessment and Plan / UC Course  I have reviewed the triage vital signs and the nursing notes.  Pertinent labs & imaging results that were available during my care of the patient were reviewed by me and considered in my medical decision making (see chart for details).    Suspect that ear symptoms may represent eustachian tube dysfunction. Will begin amoxicillin for sinusitis. Recommend that patient follow-up with Dr. Rodney Langton for her recurrent neck pain.  Final Clinical Impressions(s) / UC Diagnoses   Final diagnoses:  Acute recurrent maxillary sinusitis  Right cervical radiculopathy     Discharge Instructions     May take Pseudoephedrine (30mg , one or two every 4 to 6 hours) for sinus congestion.     Continue using saline nasal spray several times daily and saline nasal irrigation (AYR is a common brand).  Use Flonase nasal spray each morning after using saline nasal irrigation.        ED Prescriptions    Medication  Sig Dispense Auth. Provider   amoxicillin (AMOXIL) 875 MG tablet Take 1 tablet (875 mg total) by mouth 2 (two) times daily. 20 tablet , MD        Lattie Haw, MD 05/07/19 [phone]

## 2019-05-07 NOTE — ED Triage Notes (Signed)
Pt was seen on the 11th.  Since, she has had pain in both ears.  Facial pain, resembling a sinus infection.  Fatigue, and right sided neck pain.

## 2019-09-01 ENCOUNTER — Ambulatory Visit (INDEPENDENT_AMBULATORY_CARE_PROVIDER_SITE_OTHER): Payer: Federal, State, Local not specified - PPO | Admitting: Sports Medicine

## 2019-09-01 ENCOUNTER — Encounter: Payer: Self-pay | Admitting: Sports Medicine

## 2019-09-01 DIAGNOSIS — M4802 Spinal stenosis, cervical region: Secondary | ICD-10-CM | POA: Diagnosis not present

## 2019-09-01 NOTE — Assessment & Plan Note (Signed)
This is a pleasant 54 year old female, she is complaining of increasing numbness in her right hand. She has known C5-C6 spinal stenosis, she has some narrowing at the C6-C7 level as well. She did have a positive Spurling sign today with reproduction of numbness and tingling in the hand, and negative Tinel's and Phalen signs. She had a cervical epidural back in 2018 that seemed to work well so we are going to order another one. Return to see me 1 month after the injection to evaluate response.

## 2019-09-01 NOTE — Progress Notes (Signed)
    Procedures performed today:    None.  Independent interpretation of notes and tests performed by another provider:   Cervical spine MRI personally reviewed, there is moderate spinal stenosis from C5-C7, worse at the C5-C6 level.  Brief History, Exam, Impression, and Recommendations:    Cervical spinal stenosis This is a pleasant 54 year old female, she is complaining of increasing numbness in her right hand. She has known C5-C6 spinal stenosis, she has some narrowing at the C6-C7 level as well. She did have a positive Spurling sign today with reproduction of numbness and tingling in the hand, and negative Tinel's and Phalen signs. She had a cervical epidural back in 2018 that seemed to work well so we are going to order another one. Return to see me 1 month after the injection to evaluate response.    ___________________________________________ Ihor Austin. Benjamin Stain, M.D., ABFM., CAQSM. Primary Care and Sports Medicine [facility]  Adjunct Instructor of Family Medicine  [employer]

## 2020-03-11 ENCOUNTER — Ambulatory Visit: Payer: Federal, State, Local not specified - PPO | Admitting: Sports Medicine

## 2020-03-14 ENCOUNTER — Encounter: Payer: Self-pay | Admitting: Emergency Medicine

## 2020-03-14 ENCOUNTER — Emergency Department (INDEPENDENT_AMBULATORY_CARE_PROVIDER_SITE_OTHER): Payer: Federal, State, Local not specified - PPO

## 2020-03-14 ENCOUNTER — Other Ambulatory Visit: Payer: Self-pay

## 2020-03-14 ENCOUNTER — Emergency Department
Admission: EM | Admit: 2020-03-14 | Discharge: 2020-03-14 | Disposition: A | Discharge status: Home / Self Care | Payer: Federal, State, Local not specified - PPO | Source: Home / Self Care | Attending: Family Medicine | Admitting: Family Medicine

## 2020-03-14 DIAGNOSIS — J309 Allergic rhinitis, unspecified: Secondary | ICD-10-CM | POA: Insufficient documentation

## 2020-03-14 DIAGNOSIS — M5430 Sciatica, unspecified side: Secondary | ICD-10-CM | POA: Insufficient documentation

## 2020-03-14 DIAGNOSIS — J45909 Unspecified asthma, uncomplicated: Secondary | ICD-10-CM | POA: Insufficient documentation

## 2020-03-14 DIAGNOSIS — M25572 Pain in left ankle and joints of left foot: Secondary | ICD-10-CM | POA: Diagnosis not present

## 2020-03-14 DIAGNOSIS — L03312 Cellulitis of back [any part except buttock]: Secondary | ICD-10-CM | POA: Diagnosis not present

## 2020-03-14 DIAGNOSIS — M778 Other enthesopathies, not elsewhere classified: Secondary | ICD-10-CM

## 2020-03-14 DIAGNOSIS — M797 Fibromyalgia: Secondary | ICD-10-CM | POA: Insufficient documentation

## 2020-03-14 MED ORDER — DOXYCYCLINE HYCLATE 100 MG PO CAPS: ORAL_CAPSULE | ORAL | 0 refills | Status: DC

## 2020-03-14 NOTE — Discharge Instructions (Addendum)
Wear ace wrap on left foot/ankle for about 5 to 7 days.  Begin range of motion and stretching exercises.  Apply heating pad intermittently to lower back (avoid sleeping on heating pad).

## 2020-03-14 NOTE — ED Provider Notes (Signed)
[name]    CSN: [id number] Arrival date & time: 03/14/20  1222      History   Chief Complaint Chief Complaint  Patient presents with  . Foot Pain    left  . Sciatica  . Leg Pain    left    HPI Rhonda Oneal is a 55 y.o. female.   Patient presents with two complaints: 1)  She had a local injection of DepoMedrol in her lower back for sciatic pain five days ago.  Since then she has had increasing soreness around the injection site.  She also noted 2 small bruises over her left anterior thigh about 3 days ago that are now resolving. 2)  She complains of persistent soreness in her left ankle and foot, but recalls no recent injury.  The history is provided by the patient.    Past Medical History:  Diagnosis Date  . Allergy to mold   . Arthritis   . Asthma   . DERMATITIS, ATOPIC 02/09/2010   Qualifier: Diagnosis of  By: Orson Aloe MD, Tinnie Gens    . EDEMA LEG 03/22/2010   Qualifier: Diagnosis of  By: Orson Aloe MD, Tinnie Gens    . Fibromyalgia   . IBS (irritable bowel syndrome)   . Morbid obesity [facility])     Patient Active Problem List   Diagnosis Date Noted  . Allergic rhinitis 03/14/2020  . Asthma 03/14/2020  . Fibromyalgia 03/14/2020  . Sciatic nerve pain 03/14/2020  . Trigger thumb, left thumb 04/02/2019  . Degenerative spondylolisthesis 12/07/2016  . Acute foreign body of right ear canal 11/05/2016  . Cough 11/05/2016  . Mild intermittent asthma with exacerbation 11/05/2016  . Pain of left sacroiliac joint 04/18/2016  . Trochanteric bursitis of both hips 03/16/2016  . History of asthma 01/17/2016  . Acute non-recurrent maxillary sinusitis 12/15/2015  . Ischial bursitis 12/15/2015  . Mid back pain 03/18/2015  . 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 Sickle cell trait (HCC) 01/20/2013  . Synovitis of foot 12/26/2012  . Arthralgia 12/08/2012  . 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 Cervical spinal  stenosis 11/12/2012  . Brachial neuritis 11/12/2012  . Irritable bowel syndrome 11/29/2011  . Morbid obesity (HCC)   . ALLERGIC RHINITIS, SEASONAL 06/08/2010  . EDEMA LEG 03/22/2010  . DERMATITIS, ATOPIC 02/09/2010  . Vitamin D deficiency 04/15/2008    Past Surgical History:  Procedure Laterality Date  . CESAREAN SECTION    . TONSILLECTOMY AND ADENOIDECTOMY    . WISDOM TOOTH EXTRACTION      OB History    Gravida  2   Para  2   Term      Preterm      AB      Living        SAB      IAB      Ectopic      Multiple      Live Births               Home Medications    Prior to Admission medications   Medication Sig Start Date End Date Taking? Authorizing Provider  baclofen (LIORESAL) 10 MG tablet TAKE 1 TABLET BY MOUTH EVERY DAY 12/04/18  Yes [provider]  BYSTOLIC 10 MG tablet Take 10 mg by mouth daily. 01/31/19  Yes [provider]  diclofenac Sodium (VOLTAREN) 1 % GEL APPLY TO AFFECTED AREA 3 TIMES A DAY 12/14/19  Yes [provider]  doxycycline (VIBRAMYCIN) 100 MG capsule Take one cap PO Q12hr with food. 03/14/20  Yes Lattie HawBeese, Frandy Basnett A, MD  furosemide (LASIX) 20 MG tablet Take 1 tablet by mouth daily. 01/01/20  Yes [provider]  ipratropium (ATROVENT) 0.03 % nasal spray Place into the nose. 08/06/19 08/05/20 Yes [provider]  levocetirizine (XYZAL) 5 MG tablet Take by mouth. 11/23/19 11/22/20 Yes [provider]  lidocaine (LIDODERM) 5 % PLACE 1 PATCH ON THE SKIN DAILY FOR 30 DAY REMOVE & DISCARD PATCH WITHIN 12 HRS OR AS DIRECTED BY MD 02/15/20  Yes [provider]  losartan-hydrochlorothiazide (HYZAAR) 50-12.5 MG tablet Take by mouth. 12/03/19 12/02/20 Yes [provider]  pantoprazole (PROTONIX) 40 MG tablet Take by mouth. 11/23/19 11/22/20 Yes [provider]  pravastatin (PRAVACHOL) 20 MG tablet Take 20 mg by mouth at bedtime. 03/19/19  Yes [provider]   traMADol (ULTRAM) 50 MG tablet Take by mouth every 6 (six) hours as needed.   Yes [provider]  AMBULATORY NON FORMULARY MEDICATION One nebulizer machine with accesories for mild persistent asthma 11/01/16   Breeback, Jade L, PA-C  cyclobenzaprine (FLEXERIL) 10 MG tablet Take 0.5-1 tablets (5-10 mg total) by mouth 3 (three) times daily as needed for muscle spasms. 09/17/16   Sunnie NielsenAlexander, Natalie, DO  gabapentin (NEURONTIN) 800 MG tablet One tab by mouth at breakfast, midday, 2 tabs at bedtime. Patient taking differently: 600 mg 4 (four) times daily. One tab by mouth at breakfast, midday, 2 tabs at bedtime. 07/06/15   Hommel, Gregary SignsSean, DO  ibuprofen (ADVIL) 800 MG tablet TAKE 1 TABLET BY MOUTH EVERY 8 HOURS AS NEEDED FOR PAIN 01/07/19   [provider]    Family History Family History  Problem Relation Age of Onset  . Diabetes Mother   . Cancer Mother        Lung  . Hyperlipidemia Father   . Breast cancer Maternal Aunt   . Ovarian cancer Paternal Aunt     Social History Social History   Tobacco Use  . Smoking status: Never Smoker  . Smokeless tobacco: Never Used  Vaping Use  . Vaping Use: Never used  Substance Use Topics  . Alcohol use: No  . Drug use: No     Allergies   Cymbalta [duloxetine hcl] and Ferrous sulfate   Review of Systems Review of Systems  Constitutional: Negative.   HENT: Negative.   Eyes: Negative.   Respiratory: Negative.   Cardiovascular: Negative.   Gastrointestinal: Negative.   Genitourinary: Negative.   Musculoskeletal:       Left foot and ankle pain  Skin: Positive for color change.  Neurological: Negative.      Physical Exam Triage Vital Signs ED Triage Vitals  Enc Vitals Group     BP 03/14/20 1347 (!) 164/80     Pulse Rate 03/14/20 1347 (!) 113     Resp 03/14/20 1347 18     Temp 03/14/20 1347 98.5 F (36.9 C)     Temp Source 03/14/20 1347 Oral     SpO2 03/14/20 1347 99 %     Weight 03/14/20 1356 253 lb (114.8 kg)      Height 03/14/20 1356 5\' 1"  (1.549 m)     Head Circumference --      Peak Flow --      Pain Score 03/14/20 1355 0     Pain Loc --      Pain Edu? --      Excl. in [place]? --  No data found.  Updated Vital Signs BP (!) 164/80 (BP Location: Right Wrist)   Pulse (!) 113   Temp 98.5 F (36.9 C) (Oral)   Resp 18   Ht 5\' 1"  (1.549 m)   Wt 114.8 kg   SpO2 99%   BMI 47.80 kg/m   Visual Acuity Right Eye Distance:   Left Eye Distance:   Bilateral Distance:    Right Eye Near:   Left Eye Near:    Bilateral Near:     Physical Exam Vitals and nursing note reviewed.  Constitutional:      General: She is not in acute distress. HENT:     Head: Normocephalic.  Eyes:     Pupils: Pupils are equal, round, and reactive to light.  Cardiovascular:     Rate and Rhythm: Tachycardia present.  Pulmonary:     Effort: Pulmonary effort is normal.  Musculoskeletal:        General: Tenderness present. No swelling.       Feet:     Comments: Left ankle has tenderness to palpation beneath the lateral malleolus without swelling or ecchymosis.  Ankle has good range of motion. Dorsum of left foot has tenderness to palpation over extensor tendons.  Skin:    General: Skin is warm and dry.     Findings: Erythema present.          Comments: Erythema and tenderness surrounding site of recent injection left lumbar area.  Left anterior thigh has two small 1cm diameter benign appearing resolving ecchymoses.  Neurological:     Mental Status: She is alert.      UC Treatments / Results  Labs (all labs ordered are listed, but only abnormal results are displayed) Labs Reviewed - No data to display  EKG   Radiology DG Ankle Complete Left  Result Date: 03/14/2020 CLINICAL DATA:  Pain and tenderness, left lateral ankle EXAM: LEFT ANKLE COMPLETE - 3+ VIEW COMPARISON:  None. FINDINGS: There is no evidence of fracture, dislocation, or joint effusion. There is no evidence of arthropathy or other focal  bone abnormality. Diffuse soft tissue edema about the ankle. IMPRESSION: No fracture or dislocation of the left ankle. Diffuse soft tissue edema about the ankle. Electronically Signed   By: 03/16/2020 M.D.   On: 03/14/2020 15:32    Procedures Procedures (including critical care time)  Medications Ordered in UC Medications - No data to display  Initial Impression / Assessment and Plan / UC Course  I have reviewed the triage vital signs and the nursing notes.  Pertinent labs & imaging results that were available during my care of the patient were reviewed by me and considered in my medical decision making (see chart for details).    Begin doxycycline 100mg  BID for injection site cellulitis back; followup with Family Doctor if cellulitis not improved in one week.   Ace wrap applied left foot/ankle.  Given calf range of motion and stretching exercises.  Followup with Dr. 03/16/2020 ([facility]) if not improving about two weeks.     Final Clinical Impressions(s) / UC Diagnoses   Final diagnoses:  Cellulitis of back except buttock  Extensor tendonitis of foot     Discharge Instructions     Wear ace wrap on left foot/ankle for about 5 to 7 days.  Begin range of motion and stretching exercises.  Apply heating pad intermittently to lower back (avoid sleeping on heating pad).     ED Prescriptions    Medication Sig  Dispense Auth. Provider   doxycycline (VIBRAMYCIN) 100 MG capsule Take one cap PO Q12hr with food. 14 capsule Lattie Haw, MD        Lattie Haw, MD 03/16/20 [phone]

## 2020-03-14 NOTE — ED Triage Notes (Signed)
Saw PCP on Thursday last week - was told she had sciatica- given steroid shot & started on cyclobenzaprine ( only took on Friday - none since then) C/o pain to her left foot & thigh ongoing  No other pain meds over the weekend Here today for bruises on thigh & 2 spots on the outside of her left foot  + 1 edema to bilat lower extremeties

## 2020-03-17 ENCOUNTER — Telehealth: Payer: Self-pay

## 2020-03-17 NOTE — Telephone Encounter (Signed)
Pt c/o ringing in ears possibly due to doxycyline. Was requesting different antibiotic. Per Denny Peon, as long as no allergic type rxns, and if pt can manage sxs, taking doxycycline for possible infection which prescribed for is more beneficial than changing med or discontinuing. Follow up with PCP if sxs worsen or become unmanageable.

## 2020-03-18 ENCOUNTER — Ambulatory Visit: Payer: Federal, State, Local not specified - PPO | Admitting: Sports Medicine

## 2020-03-22 ENCOUNTER — Other Ambulatory Visit: Payer: Self-pay

## 2020-03-22 ENCOUNTER — Ambulatory Visit (INDEPENDENT_AMBULATORY_CARE_PROVIDER_SITE_OTHER): Payer: Federal, State, Local not specified - PPO

## 2020-03-22 ENCOUNTER — Ambulatory Visit (INDEPENDENT_AMBULATORY_CARE_PROVIDER_SITE_OTHER): Payer: Federal, State, Local not specified - PPO | Admitting: Sports Medicine

## 2020-03-22 DIAGNOSIS — M5416 Radiculopathy, lumbar region: Secondary | ICD-10-CM

## 2020-03-22 MED ORDER — PREDNISONE 50 MG PO TABS: ORAL_TABLET | ORAL | 0 refills | Status: DC

## 2020-03-22 NOTE — Assessment & Plan Note (Signed)
This is a pleasant 54 year old female, she has had numbness and tingling running down her leg, to the middle toes consistent with an L5 distribution radiculopathy. We have not imaged and treated this prior back in 2014. We will start with conservative treatment again, 5 days of prednisone 50 mg daily followed by dropping back down to her daily dose of 5 mg, x-rays, formal physical therapy, return to see me in 6 weeks, MRI for interventional planning if no better.

## 2020-03-22 NOTE — Progress Notes (Signed)
    Procedures performed today:    None.  Independent interpretation of notes and tests performed by another provider:   None.  Brief History, Exam, Impression, and Recommendations:    Left lumbar radiculopathy This is a pleasant 55 year old female, she has had numbness and tingling running down her leg, to the middle toes consistent with an L5 distribution radiculopathy. We have not imaged and treated this prior back in 2014. We will start with conservative treatment again, 5 days of prednisone 50 mg daily followed by dropping back down to her daily dose of 5 mg, x-rays, formal physical therapy, return to see me in 6 weeks, MRI for interventional planning if no better.    ___________________________________________ Ihor Austin. Benjamin Stain, M.D., ABFM., CAQSM. Primary Care and Sports Medicine [facility]  Adjunct Instructor of Family Medicine  [employer]

## 2020-04-06 ENCOUNTER — Other Ambulatory Visit: Payer: Self-pay

## 2020-04-06 ENCOUNTER — Encounter: Payer: Self-pay | Admitting: Physical Therapy

## 2020-04-06 ENCOUNTER — Ambulatory Visit (INDEPENDENT_AMBULATORY_CARE_PROVIDER_SITE_OTHER): Payer: Federal, State, Local not specified - PPO | Admitting: Physical Therapy

## 2020-04-06 DIAGNOSIS — M5442 Lumbago with sciatica, left side: Secondary | ICD-10-CM

## 2020-04-06 DIAGNOSIS — R262 Difficulty in walking, not elsewhere classified: Secondary | ICD-10-CM

## 2020-04-06 DIAGNOSIS — M6281 Muscle weakness (generalized): Secondary | ICD-10-CM | POA: Diagnosis not present

## 2020-04-06 NOTE — Patient Instructions (Signed)
Access Code: [email or web address] URL: [email or web address] Date: 04/06/2020 Prepared by: Reggy Eye  Exercises Clamshell - 1 x daily - 7 x weekly - 3 sets - 10 reps Sidelying Hip Abduction - 1 x daily - 7 x weekly - 3 sets - 10 reps Seated Sciatic Tensioner - 1 x daily - 7 x weekly - 1 sets - 10 reps Supine Posterior Pelvic Tilt - 1 x daily - 7 x weekly - 2 sets - 10 reps - 3-5 seconds hold

## 2020-04-06 NOTE — Therapy (Signed)
[facility] [place] [address] [place], [place], [address] Phone: [phone]   Fax:  [phone]  Physical Therapy Evaluation  Patient Details  Name: Rhonda Oneal MRN: [id number] Date of Birth: [birth year] Referring Provider (PT): thekkekandam   Encounter Date: 04/06/2020   PT End of Session - 04/06/20 1647    Visit Number 1    Number of Visits 12    Date for PT Re-Evaluation 05/18/20    PT Start Time 1515    PT Stop Time 1600    PT Time Calculation (min) 45 min    Activity Tolerance Patient tolerated treatment well    Behavior During Therapy [facility] for tasks assessed/performed           Past Medical History:  Diagnosis Date  . Allergy to mold   . Arthritis   . Asthma   . DERMATITIS, ATOPIC 02/09/2010   Qualifier: Diagnosis of  By: Orson Aloe MD, Tinnie Gens    . EDEMA LEG 03/22/2010   Qualifier: Diagnosis of  By: Orson Aloe MD, Tinnie Gens    . Fibromyalgia   . IBS (irritable bowel syndrome)   . Morbid obesity (HCC)     Past Surgical History:  Procedure Laterality Date  . CESAREAN SECTION    . TONSILLECTOMY AND ADENOIDECTOMY    . WISDOM TOOTH EXTRACTION      There were no vitals filed for this visit.    Subjective Assessment - 04/06/20 1519    Subjective Pt states she has had Lt sided radicular pain down Lt LE x 3 months. Pt then got bitten by a snake on Lt foot 3 weeks ago and is having increased difficulty walking and increased pain. Pain increases with prolonged walking, prolonged time in 1 position. Pain decreases with heat    Pertinent History pt had infection at site of prednisone shot in hip    Limitations Walking    How long can you walk comfortably? 20-30 minutes    Diagnostic tests x ray shows facet degeneration L4-S1    Patient Stated Goals decrease pain, be able to sleep and walk without pain    Currently in Pain? Yes    Pain Score 6     Pain Location Leg    Pain Orientation Left    Pain  Descriptors / Indicators Sore    Pain Type Chronic pain    Pain Onset More than a month ago    Pain Frequency Intermittent    Aggravating Factors  prolonged walking, prolonged positions    Pain Relieving Factors heat    Effect of Pain on Daily Activities decreased ability to walk and sleep              [facility] PT Assessment - 04/06/20 0001      Assessment   Medical Diagnosis lumbar radiculopathy    Referring Provider (PT) [email or web address]      Balance Screen   Has the patient fallen in the past 6 months No      Prior Function   Level of Independence Independent      Observation/Other Assessments   Focus on Therapeutic Outcomes (FOTO)  49 functional status measure      Sensation   Light Touch Impaired Detail    Light Touch Impaired Details Impaired LLE      ROM / Strength   AROM / PROM / Strength AROM;Strength      AROM   AROM Assessment Site Lumbar    Lumbar Flexion [facility]  Lumbar Extension unable, pain    Lumbar - Right Side Bend WFL    Lumbar - Left Side Bend WFL    Lumbar - Right Rotation WFL pain end range    Lumbar - Left Rotation WFL pain end range      Strength   Strength Assessment Site Hip    Right/Left Hip Right;Left    Right Hip Flexion 4/5    Right Hip Extension 3/5    Right Hip ABduction 3+/5    Left Hip Flexion 4-/5    Left Hip Extension 3/5    Left Hip ABduction 3/5      Palpation   Spinal mobility hypomobile L2-L4 for PA mobs    Palpation comment TTP Lt piriformis, increased mm spasticity bilat lumbar paraspinals L1-L5, TTP spinous and transverse processes L2-L5, SIJ      Special Tests    Special Tests Lumbar    Lumbar Tests Straight Leg Raise;Slump Test      Slump test   Findings Positive    Side Left      Straight Leg Raise   Findings Positive    Side  Left                      Objective measurements completed on examination: See above findings.       [facility] Adult PT Treatment/Exercise - 04/06/20 0001      Exercises    Exercises Lumbar      Lumbar Exercises: Seated   Other Seated Lumbar Exercises sciatic nerve glide x 5      Lumbar Exercises: Supine   Pelvic Tilt 10 reps      Lumbar Exercises: Sidelying   Clam 10 reps    Hip Abduction 10 reps      Modalities   Modalities Moist Heat;Electrical Stimulation      Moist Heat Therapy   Number Minutes Moist Heat 10 Minutes    Moist Heat Location Lumbar Spine      Electrical Stimulation   Electrical Stimulation Location lumbar spine    Electrical Stimulation Action IFC    Electrical Stimulation Parameters to tolerance    Electrical Stimulation Goals Pain                  PT Education - 04/06/20 1554    Education Details PT POC and goals, HEP    Person(s) Educated Patient;Spouse    Methods Explanation;Demonstration;Handout    Comprehension Verbalized understanding;Returned demonstration               PT Long Term Goals - 04/06/20 1647      PT LONG TERM GOAL #1   Title Pt will be independent with HEP    Time 6    Period Weeks    Status New    Target Date 05/18/20      PT LONG TERM GOAL #2   Title Pt will improve FOTO to >= 70 to demo improved functional mobility    Time 6    Period Weeks    Status New    Target Date 05/18/20      PT LONG TERM GOAL #3   Title Pt will tolerate walking x 30 minutes with pain <= 2/10    Time 6    Period Weeks    Status New    Target Date 05/18/20      PT LONG TERM GOAL #4   Title Pt will improve LE strength to 4/5 bilat to tolerate  walking and standing for IADLs    Time 6    Period Weeks    Status New                  Plan - 04/06/20 1559    Clinical Impression Statement Pt presents with decreased strength and mobility, increased pain and decreased activity tolerance and will benefit from skilled PT to address deficits and improve functional mobility.    Personal Factors and Comorbidities Comorbidity 2    Examination-Activity Limitations Locomotion Level;Lift;Carry     Examination-Participation Restrictions Cleaning;Community Activity;Yard Work;Shop    Stability/Clinical Decision Making Stable/Uncomplicated    Clinical Decision Making Low    Rehab Potential Good    PT Frequency 2x / week    PT Duration 6 weeks    PT Treatment/Interventions Electrical Stimulation;Traction;Moist Heat;Iontophoresis 4mg /ml Dexamethasone;Therapeutic activities;Therapeutic exercise;Neuromuscular re-education;Taping;Dry needling;Patient/family education;Cryotherapy    PT Next Visit Plan assess HEP, traction? manual vs mechanical, modalities and manual as indicated    PT Home Exercise Plan Access Code: [email or web address]           Patient will benefit from skilled therapeutic intervention in order to improve the following deficits and impairments:  Decreased strength,Decreased mobility,Decreased activity tolerance,Pain,Increased muscle spasms,Difficulty walking  Visit Diagnosis: Left-sided low back pain with left-sided sciatica, unspecified chronicity - Plan: PT plan of care cert/re-cert  Muscle weakness (generalized) - Plan: PT plan of care cert/re-cert  Difficulty in walking, not elsewhere classified - Plan: PT plan of care cert/re-cert     Problem List Patient Active Problem List   Diagnosis Date Noted  . Allergic rhinitis 03/14/2020  . Asthma 03/14/2020  . Fibromyalgia 03/14/2020  . Sciatic nerve pain 03/14/2020  . Trigger thumb, left thumb 04/02/2019  . Degenerative spondylolisthesis 12/07/2016  . Acute foreign body of right ear canal 11/05/2016  . Cough 11/05/2016  . Mild intermittent asthma with exacerbation 11/05/2016  . Pain of left sacroiliac joint 04/18/2016  . Trochanteric bursitis of both hips 03/16/2016  . History of asthma 01/17/2016  . Acute non-recurrent maxillary sinusitis 12/15/2015  . Ischial bursitis 12/15/2015  . Mid back pain 03/18/2015  . 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 Sickle cell trait (HCC) 01/20/2013  .  Synovitis of foot 12/26/2012  . Arthralgia 12/08/2012  . 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 Cervical spinal stenosis 11/12/2012  . Brachial neuritis 11/12/2012  . Left lumbar radiculopathy 05/13/2012  . Irritable bowel syndrome 11/29/2011  . Morbid obesity (HCC)   . ALLERGIC RHINITIS, SEASONAL 06/08/2010  . EDEMA LEG 03/22/2010  . DERMATITIS, ATOPIC 02/09/2010  . Vitamin D deficiency 04/15/2008   Elyon Zoll, PT  Lorean Ekstrand 04/06/2020, 4:51 PM  [facility] [address] [place], [place], [place] Phone: [phone]   Fax:  [phone]  Name: Zehra Rucci MRN: Tilda Burrow Date of Birth: Sep 01, 1965

## 2020-04-13 ENCOUNTER — Other Ambulatory Visit: Payer: Self-pay

## 2020-04-13 ENCOUNTER — Encounter: Payer: Federal, State, Local not specified - PPO | Admitting: Physical Therapy

## 2020-04-13 ENCOUNTER — Ambulatory Visit (INDEPENDENT_AMBULATORY_CARE_PROVIDER_SITE_OTHER): Payer: Federal, State, Local not specified - PPO | Admitting: Sports Medicine

## 2020-04-13 DIAGNOSIS — M25571 Pain in right ankle and joints of right foot: Secondary | ICD-10-CM

## 2020-04-13 DIAGNOSIS — M5416 Radiculopathy, lumbar region: Secondary | ICD-10-CM

## 2020-04-13 DIAGNOSIS — M25572 Pain in left ankle and joints of left foot: Secondary | ICD-10-CM | POA: Diagnosis not present

## 2020-04-13 DIAGNOSIS — M4802 Spinal stenosis, cervical region: Secondary | ICD-10-CM

## 2020-04-13 MED ORDER — PREGABALIN 75 MG PO CAPS: 75.0000 mg | ORAL_CAPSULE | Freq: Two times a day (BID) | ORAL | 3 refills | Status: DC

## 2020-04-13 NOTE — Assessment & Plan Note (Signed)
Sinus tarsi syndrome noted on MRI years ago,

## 2020-04-13 NOTE — Progress Notes (Signed)
    Procedures performed today:    None.  Independent interpretation of notes and tests performed by another provider:   None.  Brief History, Exam, Impression, and Recommendations:    Sinus tarsi syndrome of both ankles Sinus tarsi syndrome noted on MRI years ago,  Left lumbar radiculopathy Persistent left-sided lumbar radicular symptoms. It is too early to consider epidural/MRI, we just tarted treating his in the beginning of February and she is only had a couple sessions of physical therapy. Switching from gabapentin to Lyrica. We will revisit this in 3 weeks at which point we can get an MRI and plan for an epidural.  Cervical spinal stenosis Of pain in the neck with radiation down the right arm. He does have known C5-C6 spinal stenosis and narrowing at the C6-C7 level as well, cervical epidural only provided minimal relief, I do think Lyrica will help this, updating the x-rays today, and will probably get an updated MRI at the follow-up visit if not significantly better.    ___________________________________________ Ihor Austin. Benjamin Stain, M.D., ABFM., CAQSM. Primary Care and Sports Medicine [facility]  Adjunct Instructor of Family Medicine  [employer]

## 2020-04-13 NOTE — Assessment & Plan Note (Signed)
Persistent left-sided lumbar radicular symptoms. It is too early to consider epidural/MRI, we just tarted treating his in the beginning of February and she is only had a couple sessions of physical therapy. Switching from gabapentin to Lyrica. We will revisit this in 3 weeks at which point we can get an MRI and plan for an epidural.

## 2020-04-13 NOTE — Assessment & Plan Note (Signed)
Of pain in the neck with radiation down the right arm. He does have known C5-C6 spinal stenosis and narrowing at the C6-C7 level as well, cervical epidural only provided minimal relief, I do think Lyrica will help this, updating the x-rays today, and will probably get an updated MRI at the follow-up visit if not significantly better.

## 2020-04-20 ENCOUNTER — Other Ambulatory Visit: Payer: Self-pay

## 2020-04-20 ENCOUNTER — Ambulatory Visit (INDEPENDENT_AMBULATORY_CARE_PROVIDER_SITE_OTHER): Payer: Federal, State, Local not specified - PPO | Admitting: Physical Therapy

## 2020-04-20 DIAGNOSIS — M5442 Lumbago with sciatica, left side: Secondary | ICD-10-CM | POA: Diagnosis not present

## 2020-04-20 DIAGNOSIS — R262 Difficulty in walking, not elsewhere classified: Secondary | ICD-10-CM | POA: Diagnosis not present

## 2020-04-20 DIAGNOSIS — M6281 Muscle weakness (generalized): Secondary | ICD-10-CM | POA: Diagnosis not present

## 2020-04-20 NOTE — Therapy (Signed)
[facility] [place] [address] [place], [place], [address] Phone: [phone]   Fax:  [phone]  Physical Therapy Treatment  Patient Details  Name: Rhonda Oneal MRN: [id number] Date of Birth: [birth year] Referring Provider (PT): Benjamin Stain   Encounter Date: 04/20/2020   PT End of Session - 04/20/20 1525    Visit Number 2    Number of Visits 12    Date for PT Re-Evaluation 05/18/20    PT Start Time 1521    PT Stop Time 1608    PT Time Calculation (min) 47 min    Activity Tolerance Patient tolerated treatment well    Behavior During Therapy [facility] for tasks assessed/performed           Past Medical History:  Diagnosis Date  . Allergy to mold   . Arthritis   . Asthma   . DERMATITIS, ATOPIC 02/09/2010   Qualifier: Diagnosis of  By: Orson Aloe MD, Tinnie Gens    . EDEMA LEG 03/22/2010   Qualifier: Diagnosis of  By: Orson Aloe MD, Tinnie Gens    . Fibromyalgia   . IBS (irritable bowel syndrome)   . Morbid obesity (HCC)     Past Surgical History:  Procedure Laterality Date  . CESAREAN SECTION    . TONSILLECTOMY AND ADENOIDECTOMY    . WISDOM TOOTH EXTRACTION      There were no vitals filed for this visit.   Subjective Assessment - 04/20/20 1526    Subjective Pt reports continued pain in Lt low back and across top of Lt ankle.  No changes since last visit. She's wearing a compression sock on Lt leg due to swelling.    Patient Stated Goals decrease pain, be able to sleep and walk without pain    Currently in Pain? Yes    Pain Score 7     Pain Location Back    Pain Orientation Left    Pain Descriptors / Indicators Sore    Aggravating Factors  prolonged positions.    Pain Relieving Factors heat, ice              [facility] PT Assessment - 04/20/20 0001      Assessment   Medical Diagnosis lumbar radiculopathy    Referring Provider (PT) Thekkekandam    Hand Dominance Right           [facility] Adult PT  Treatment/Exercise - 04/20/20 0001      Lumbar Exercises: Stretches   Active Hamstring Stretch Limitations Lt/Rt leg nerve tensioner with hip flexed to 90 deg and knee flex /ext x 10 (added head lifts x 5 on LLE).    Press Ups 2 reps;10 seconds    Press Ups Limitations increased LBP.    Quad Stretch Left;2 reps;30 seconds   prone quad stretch     Lumbar Exercises: Aerobic   Nustep L4: legs only, 6.5 min for warm up, speed to tolerance      Lumbar Exercises: Supine   Ab Set 5 reps;5 seconds    Bridge 10 reps   with ab set     Lumbar Exercises: Sidelying   Clam Left;10 reps cues for form.    Hip Abduction Left;10 reps    Hip Abduction Limitations cues for core engagement, leg alignment.      Moist Heat Therapy   Number Minutes Moist Heat 10 Minutes    Moist Heat Location Lumbar Spine      Electrical Stimulation   Electrical Stimulation Location bilat lumbar paraspinals  Electrical Stimulation Action TENS x 10 min    Electrical Stimulation Parameters intensity to tolerance    Electrical Stimulation Goals Pain         Pt instructed in self massage with ball to low back musculature; pt verbalized understanding.          PT Education - 04/20/20 1603    Education Details posture and body mechanics, TENS, HEP updated.    Person(s) Educated Patient    Methods Explanation;Demonstration;Handout;Verbal cues    Comprehension Returned demonstration;Verbalized understanding               PT Long Term Goals - 04/06/20 1647      PT LONG TERM GOAL #1   Title Pt will be independent with HEP    Time 6    Period Weeks    Status New    Target Date 05/18/20      PT LONG TERM GOAL #2   Title Pt will improve FOTO to >= 70 to demo improved functional mobility    Time 6    Period Weeks    Status New    Target Date 05/18/20      PT LONG TERM GOAL #3   Title Pt will tolerate walking x 30 minutes with pain <= 2/10    Time 6    Period Weeks    Status New    Target Date  05/18/20      PT LONG TERM GOAL #4   Title Pt will improve LE strength to 4/5 bilat to tolerate walking and standing for IADLs    Time 6    Period Weeks    Status New                 Plan - 04/20/20 1603    Clinical Impression Statement Pt reported reduction of low back pain to 4/10 with supine exercises. Further reduction of pain with TENS and decompression position.  Goals are ongoing.    Personal Factors and Comorbidities Comorbidity 2    Examination-Activity Limitations Locomotion Level;Lift;Carry    Examination-Participation Restrictions Cleaning;Community Activity;Yard Work;Shop    Stability/Clinical Decision Making Stable/Uncomplicated    Rehab Potential Good    PT Frequency 2x / week    PT Duration 6 weeks    PT Treatment/Interventions Electrical Stimulation;Traction;Moist Heat;Iontophoresis 4mg /ml Dexamethasone;Therapeutic activities;Therapeutic exercise;Neuromuscular re-education;Taping;Dry needling;Patient/family education;Cryotherapy    PT Next Visit Plan Assess response to HEP/ progress as tolerated.  review body mechanics.     PT Home Exercise Plan Access Code: [email or web address]           Patient will benefit from skilled therapeutic intervention in order to improve the following deficits and impairments:  Decreased strength,Decreased mobility,Decreased activity tolerance,Pain,Increased muscle spasms,Difficulty walking  Visit Diagnosis: Left-sided low back pain with left-sided sciatica, unspecified chronicity  Muscle weakness (generalized)  Difficulty in walking, not elsewhere classified     Problem List Patient Active Problem List   Diagnosis Date Noted  . Allergic rhinitis 03/14/2020  . Asthma 03/14/2020  . Fibromyalgia 03/14/2020  . Sciatic nerve pain 03/14/2020  . Trigger thumb, left thumb 04/02/2019  . Degenerative spondylolisthesis 12/07/2016  . Acute foreign body of right ear canal 11/05/2016  . Cough 11/05/2016  . Mild intermittent asthma with  exacerbation 11/05/2016  . Pain of left sacroiliac joint 04/18/2016  . Trochanteric bursitis of both hips 03/16/2016  . History of asthma 01/17/2016  . Acute non-recurrent maxillary sinusitis 12/15/2015  . Ischial bursitis 12/15/2015  . Mid back pain 03/18/2015  .  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 Sickle cell trait (HCC) 01/20/2013  . Synovitis of foot 12/26/2012  . Arthralgia 12/08/2012  . 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  . Cervical spinal stenosis 11/12/2012  . Brachial neuritis 11/12/2012  . Left lumbar radiculopathy 05/13/2012  . Irritable bowel syndrome 11/29/2011  . Morbid obesity (HCC)   . ALLERGIC RHINITIS, SEASONAL 06/08/2010  . EDEMA LEG 03/22/2010  . DERMATITIS, ATOPIC 02/09/2010  . Vitamin D deficiency 04/15/2008   Mayer Camel, PTA 04/20/20 5:25 PM  [facility] [place] [address] [place], [place], [address] Phone: [phone]   Fax:  [phone]  Name: Rhonda Oneal MRN: [id number] Date of Birth: 1965-03-30

## 2020-04-20 NOTE — Patient Instructions (Addendum)
TENS UNIT: This is helpful for muscle pain and spasm.   Search and Purchase a TENS 7000 2nd edition at [employer] It should be less than $30.     TENS unit instructions: Do not shower or bathe with the unit on Turn the unit off before removing electrodes or batteries If the electrodes lose stickiness add a drop of water to the electrodes after they are disconnected from the unit and place on plastic sheet. If you continued to have difficulty, call the TENS unit company to purchase more electrodes. Do not apply lotion on the skin area prior to use. Make sure the skin is clean and dry as this will help prolong the life of the electrodes. After use, always check skin for unusual red areas, rash or other skin difficulties. If there are any skin problems, does not apply electrodes to the same area. Never remove the electrodes from the unit by pulling the wires. Do not use the TENS unit or electrodes other than as directed. Do not change electrode placement without consultating your therapist or physician. Keep 2 fingers width between each electrode. Wear time ratio is 2:1, on to off times.    For example on for 30 minutes off for 15 minutes and then on for 30 minutes off for 15 minutes  Sleeping on Back  Place pillow under knees. A pillow with cervical support and a roll around waist are also helpful. Copyright  VHI. All rights reserved.  Sleeping on Side Place pillow between knees. Use cervical support under neck and a roll around waist as needed. Copyright  VHI. All rights reserved.   Sleeping on Stomach   If this is the only desirable sleeping position, place pillow under lower legs, and under stomach or chest as needed.  Posture - Sitting   Sit upright, head facing forward. Try using a roll to support lower back. Keep shoulders relaxed, and avoid rounded back. Keep hips level with knees. Avoid crossing legs for long periods. Stand to Sit / Sit to Stand   To sit: Bend knees  to lower self onto front edge of chair, then scoot back on seat. To stand: Reverse sequence by placing one foot forward, and scoot to front of seat. Use rocking motion to stand up.   Work Height and Reach  Ideal work height is no more than 2 to 4 inches below elbow level when standing, and at elbow level when sitting. Reaching should be limited to arm's length, with elbows slightly bent.  Bending  Bend at hips and knees, not back. Keep feet shoulder-width apart.    Posture - Standing   Good posture is important. Avoid slouching and forward head thrust. Maintain curve in low back and align ears over shoul- ders, hips over ankles.  Alternating Positions   Alternate tasks and change positions frequently to reduce fatigue and muscle tension. Take rest breaks. Computer Work   Position work to Art gallery manager. Use proper work and seat height. Keep shoulders back and down, wrists straight, and elbows at right angles. Use chair that provides full back support. Add footrest and lumbar roll as needed.  Getting Into / Out of Car  Lower self onto seat, scoot back, then bring in one leg at a time. Reverse sequence to get out.  Dressing  Lie on back to pull socks or slacks over feet, or sit and bend leg while keeping back straight.    Housework - Sink  Place one foot on ledge of cabinet under  sink when standing at sink for prolonged periods.   Pushing / Pulling  Pushing is preferable to pulling. Keep back in proper alignment, and use leg muscles to do the work.  Deep Squat   Squat and lift with both arms held against upper trunk. Tighten stomach muscles without holding breath. Use smooth movements to avoid jerking.  Avoid Twisting   Avoid twisting or bending back. Pivot around using foot movements, and bend at knees if needed when reaching for articles.  Carrying Luggage   Distribute weight evenly on both sides. Use a cart whenever possible. Do not twist trunk. Move body as a  unit.   Lifting Principles .Maintain proper posture and head alignment. .Slide object as close as possible before lifting. .Move obstacles out of the way. .Test before lifting; ask for help if too heavy. .Tighten stomach muscles without holding breath. .Use smooth movements; do not jerk. .Use legs to do the work, and pivot with feet. .Distribute the work load symmetrically and close to the center of trunk. .Push instead of pull whenever possible.   Ask For Help   Ask for help and delegate to others when possible. Coordinate your movements when lifting together, and maintain the low back curve.  Log Roll   Lying on back, bend left knee and place left arm across chest. Roll all in one movement to the right. Reverse to roll to the left. Always move as one unit. Housework - Sweeping  Use long-handled equipment to avoid stooping.   Housework - Wiping  Position yourself as close as possible to reach work surface. Avoid straining your back.  Laundry - Unloading Wash   To unload small items at bottom of washer, lift leg opposite to arm being used to reach.  Gardening - Raking  Move close to area to be raked. Use arm movements to do the work. Keep back straight and avoid twisting.     Cart  When reaching into cart with one arm, lift opposite leg to keep back straight.   Getting Into / Out of Bed  Lower self to lie down on one side by raising legs and lowering head at the same time. Use arms to assist moving without twisting. Bend both knees to roll onto back if desired. To sit up, start from lying on side, and use same move-ments in reverse. Housework - Vacuuming  Hold the vacuum with arm held at side. Step back and forth to move it, keeping head up. Avoid twisting.   Laundry - Armed forces training and education officer so that bending and twisting can be avoided.   Laundry - Unloading Dryer  Squat down to reach into clothes dryer or use a reacher.  Gardening - Weeding  / Psychiatric nurse or Kneel. Knee pads may be helpful.                   Access Code: [email or web address] URL: [email or web address] Date: 04/20/2020 Prepared by: [facility] [place]  Exercises Clamshell - 1 x daily - 3 x weekly - 2 sets - 10 reps Sidelying Hip Abduction - 1 x daily - 3 x weekly - 2 sets - 10 reps Hooklying Transversus Abdominis Palpation - 1 x daily - 7 x weekly - 3 sets - 10 reps - 5 hold Supine Bridge - 1 x daily - 7 x weekly - 1 sets - 10 reps Supine Hamstring Stretch - 1 x daily - 7 x weekly - 1 sets - 5 reps -  10 hold

## 2020-04-27 ENCOUNTER — Other Ambulatory Visit: Payer: Self-pay

## 2020-04-27 ENCOUNTER — Ambulatory Visit (INDEPENDENT_AMBULATORY_CARE_PROVIDER_SITE_OTHER): Payer: Federal, State, Local not specified - PPO | Admitting: Physical Therapy

## 2020-04-27 DIAGNOSIS — R262 Difficulty in walking, not elsewhere classified: Secondary | ICD-10-CM | POA: Diagnosis not present

## 2020-04-27 DIAGNOSIS — M6281 Muscle weakness (generalized): Secondary | ICD-10-CM | POA: Diagnosis not present

## 2020-04-27 DIAGNOSIS — M5442 Lumbago with sciatica, left side: Secondary | ICD-10-CM | POA: Diagnosis not present

## 2020-04-27 NOTE — Therapy (Signed)
[facility] [place] [address] [place], [place], [address] Phone: [phone]   Fax:  [phone]  Physical Therapy Treatment  Patient Details  Name: Rhonda Oneal MRN: [id number] Date of Birth: 1965/05/06 Referring Provider (PT): Thekkekandam   Encounter Date: 04/27/2020   PT End of Session - 04/27/20 1644    Visit Number 3    Number of Visits 12    Date for PT Re-Evaluation 05/18/20    PT Start Time 1607    PT Stop Time 1650    PT Time Calculation (min) 43 min    Activity Tolerance Patient tolerated treatment well    Behavior During Therapy [facility] for tasks assessed/performed           Past Medical History:  Diagnosis Date  . Allergy to mold   . Arthritis   . Asthma   . DERMATITIS, ATOPIC 02/09/2010   Qualifier: Diagnosis of  By: Orson Aloe MD, Tinnie Gens    . EDEMA LEG 03/22/2010   Qualifier: Diagnosis of  By: Orson Aloe MD, Tinnie Gens    . Fibromyalgia   . IBS (irritable bowel syndrome)   . Morbid obesity (HCC)     Past Surgical History:  Procedure Laterality Date  . CESAREAN SECTION    . TONSILLECTOMY AND ADENOIDECTOMY    . WISDOM TOOTH EXTRACTION      There were no vitals filed for this visit.   Subjective Assessment - 04/27/20 1610    Subjective Pt states she put on a new pair of shoes and it flared up her back/leg pain. pt states she has been feeling better when getting out of bed.    Currently in Pain? No/denies                             [facility] Adult PT Treatment/Exercise - 04/27/20 0001      Lumbar Exercises: Stretches   Active Hamstring Stretch Limitations Lt/Rt leg nerve tensioner x 10    Quad Stretch Left;2 reps;20 seconds    Quad Stretch Limitations prone      Lumbar Exercises: Aerobic   Nustep L5 x 3 minutes LEs only      Lumbar Exercises: Supine   Ab Set 5 reps;5 seconds    Clam 20 reps    Clam Limitations red TB    Heel Slides 10 reps    Heel Slides Limitations with  ab set    Bridge 10 reps   with ab set     Lumbar Exercises: Sidelying   Clam Left;10 reps    Hip Abduction Limitations pain after 2 reps, unable to continue      Moist Heat Therapy   Number Minutes Moist Heat 10 Minutes    Moist Heat Location Lumbar Spine      Electrical Stimulation   Electrical Stimulation Location lumbar spine    Electrical Stimulation Action IFC    Electrical Stimulation Parameters to tolerance    Electrical Stimulation Goals Pain      Manual Therapy   Manual Therapy Joint mobilization;Soft tissue mobilization    Joint Mobilization grade 2-3 SIJ mobs to increase mobility and decrease pain    Soft tissue mobilization STM bilat lumbar paraspinals to decrease mm spasticity and decrease pain and improve mobility                       PT Long Term Goals - 04/06/20 1647  PT LONG TERM GOAL #1   Title Pt will be independent with HEP    Time 6    Period Weeks    Status New    Target Date 05/18/20      PT LONG TERM GOAL #2   Title Pt will improve FOTO to >= 70 to demo improved functional mobility    Time 6    Period Weeks    Status New    Target Date 05/18/20      PT LONG TERM GOAL #3   Title Pt will tolerate walking x 30 minutes with pain <= 2/10    Time 6    Period Weeks    Status New    Target Date 05/18/20      PT LONG TERM GOAL #4   Title Pt will improve LE strength to 4/5 bilat to tolerate walking and standing for IADLs    Time 6    Period Weeks    Status New                 Plan - 04/27/20 1644    Clinical Impression Statement Pt improving coordination with ab set. continues to require tactile cues for ab activation with heel slides. responds well to estim    PT Next Visit Plan continue to progress core strength. manual and modalities as needed    PT Home Exercise Plan Access Code: [email or web address]    Consulted and Agree with Plan of Care Patient           Patient will benefit from skilled therapeutic intervention in  order to improve the following deficits and impairments:     Visit Diagnosis: Left-sided low back pain with left-sided sciatica, unspecified chronicity  Muscle weakness (generalized)  Difficulty in walking, not elsewhere classified     Problem List Patient Active Problem List   Diagnosis Date Noted  . Allergic rhinitis 03/14/2020  . Asthma 03/14/2020  . Fibromyalgia 03/14/2020  . Sciatic nerve pain 03/14/2020  . Trigger thumb, left thumb 04/02/2019  . Degenerative spondylolisthesis 12/07/2016  . Acute foreign body of right ear canal 11/05/2016  . Cough 11/05/2016  . Mild intermittent asthma with exacerbation 11/05/2016  . Pain of left sacroiliac joint 04/18/2016  . Trochanteric bursitis of both hips 03/16/2016  . History of asthma 01/17/2016  . Acute non-recurrent maxillary sinusitis 12/15/2015  . Ischial bursitis 12/15/2015  . Mid back pain 03/18/2015  . 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 Sickle cell trait (HCC) 01/20/2013  . Synovitis of foot 12/26/2012  . Arthralgia 12/08/2012  . 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  . Cervical spinal stenosis 11/12/2012  . Brachial neuritis 11/12/2012  . Left lumbar radiculopathy 05/13/2012  . Irritable bowel syndrome 11/29/2011  . Morbid obesity (HCC)   . ALLERGIC RHINITIS, SEASONAL 06/08/2010  . EDEMA LEG 03/22/2010  . DERMATITIS, ATOPIC 02/09/2010  . Vitamin D deficiency 04/15/2008   Beonca Gibb, PT  Britton Perkinson 04/27/2020, 4:46 PM  [facility] [address] [place], [place], [address] Phone: [phone]   Fax:  [phone]  Name: Keneisha Heckart MRN: [id number] Date of Birth: 1965-08-21

## 2020-05-03 ENCOUNTER — Ambulatory Visit: Payer: Federal, State, Local not specified - PPO | Admitting: Sports Medicine

## 2020-05-04 ENCOUNTER — Ambulatory Visit (INDEPENDENT_AMBULATORY_CARE_PROVIDER_SITE_OTHER): Payer: Federal, State, Local not specified - PPO | Admitting: Sports Medicine

## 2020-05-04 ENCOUNTER — Other Ambulatory Visit: Payer: Self-pay

## 2020-05-04 ENCOUNTER — Ambulatory Visit: Payer: Federal, State, Local not specified - PPO | Admitting: Sports Medicine

## 2020-05-04 ENCOUNTER — Encounter: Payer: Federal, State, Local not specified - PPO | Admitting: Physical Therapy

## 2020-05-04 DIAGNOSIS — M5416 Radiculopathy, lumbar region: Secondary | ICD-10-CM | POA: Diagnosis not present

## 2020-05-04 MED ORDER — PREGABALIN 150 MG PO CAPS: 150.0000 mg | ORAL_CAPSULE | Freq: Two times a day (BID) | ORAL | 3 refills | Status: DC

## 2020-05-04 MED ORDER — TRIAZOLAM 0.25 MG PO TABS: ORAL_TABLET | ORAL | 0 refills | Status: DC

## 2020-05-04 NOTE — Assessment & Plan Note (Signed)
Continued left L5 distribution radiculitis without progressive weakness, she has now had greater than 6 weeks of physician directed conservative treatment, she has pain in her left low back, with radiation down to the middle toes. Increasing Lyrica to 150 mg twice daily, adding an MRI, return to see me to go over MRI results. I did advise her that I did not think her radicular symptoms were due to a snakebite sometime ago.

## 2020-05-04 NOTE — Progress Notes (Signed)
    Procedures performed today:    None.  Independent interpretation of notes and tests performed by another provider:   None.  Brief History, Exam, Impression, and Recommendations:    Left lumbar radiculopathy Continued left L5 distribution radiculitis without progressive weakness, she has now had greater than 6 weeks of physician directed conservative treatment, she has pain in her left low back, with radiation down to the middle toes. Increasing Lyrica to 150 mg twice daily, adding an MRI, return to see me to go over MRI results. I did advise her that I did not think her radicular symptoms were due to a snakebite sometime ago.    ___________________________________________ Ihor Austin. Benjamin Stain, M.D., ABFM., CAQSM. Primary Care and Sports Medicine [facility]  Adjunct Instructor of Family Medicine  [employer]

## 2020-05-09 ENCOUNTER — Ambulatory Visit (INDEPENDENT_AMBULATORY_CARE_PROVIDER_SITE_OTHER): Payer: Federal, State, Local not specified - PPO

## 2020-05-09 ENCOUNTER — Other Ambulatory Visit: Payer: Self-pay

## 2020-05-09 DIAGNOSIS — M7918 Myalgia, other site: Secondary | ICD-10-CM

## 2020-05-09 DIAGNOSIS — M5416 Radiculopathy, lumbar region: Secondary | ICD-10-CM

## 2020-05-09 DIAGNOSIS — M79672 Pain in left foot: Secondary | ICD-10-CM

## 2020-05-09 DIAGNOSIS — M79605 Pain in left leg: Secondary | ICD-10-CM | POA: Diagnosis not present

## 2020-05-11 ENCOUNTER — Ambulatory Visit: Payer: Federal, State, Local not specified - PPO | Admitting: Sports Medicine

## 2020-05-11 ENCOUNTER — Other Ambulatory Visit: Payer: Self-pay

## 2020-05-11 ENCOUNTER — Ambulatory Visit (INDEPENDENT_AMBULATORY_CARE_PROVIDER_SITE_OTHER): Payer: Federal, State, Local not specified - PPO | Admitting: Physical Therapy

## 2020-05-11 ENCOUNTER — Encounter: Payer: Federal, State, Local not specified - PPO | Admitting: Physical Therapy

## 2020-05-11 DIAGNOSIS — M5442 Lumbago with sciatica, left side: Secondary | ICD-10-CM

## 2020-05-11 DIAGNOSIS — M5416 Radiculopathy, lumbar region: Secondary | ICD-10-CM

## 2020-05-11 DIAGNOSIS — R262 Difficulty in walking, not elsewhere classified: Secondary | ICD-10-CM

## 2020-05-11 DIAGNOSIS — M6281 Muscle weakness (generalized): Secondary | ICD-10-CM

## 2020-05-11 NOTE — Progress Notes (Signed)
    Procedures performed today:    None.  Independent interpretation of notes and tests performed by another provider:   MRI personally reviewed, multilevel lumbar spinal stenosis worst from L4-S1 with severe bilateral facet arthritis.  Brief History, Exam, Impression, and Recommendations:    Left lumbar radiculopathy Multilevel lumbar spinal stenosis with left-sided L5 radiculopathy, failed conservative measures, adding aquatic therapy and a left L5-S1 transforaminal epidural, return to see me 1 month after injection.    ___________________________________________ Ihor Austin. Benjamin Stain, M.D., ABFM., CAQSM. Primary Care and Sports Medicine [facility]  Adjunct Instructor of Family Medicine  [employer]

## 2020-05-11 NOTE — Therapy (Addendum)
[place] [place], [place], [address] Phone: [phone]   Fax:  [phone]  Physical Therapy Treatment and Discharge  Patient Details  Name: Rhonda Oneal MRN: [id number] Date of Birth: 02-Jul-1965 Referring Provider (PT): Thekkekandam   Encounter Date: 05/11/2020   PT End of Session - 05/11/20 6440    Visit Number 4    Number of Visits 12    Date for PT Re-Evaluation 05/18/20    PT Start Time 1530    PT Stop Time 1610    PT Time Calculation (min) 40 min    Activity Tolerance Patient tolerated treatment well    Behavior During Therapy [name]           Past Medical History:  Diagnosis Date  . Allergy to mold   . Arthritis   . Asthma   . DERMATITIS, ATOPIC 02/09/2010   Qualifier: Diagnosis of  By: Rhonda Nimrod MD, Dellis Oneal    . EDEMA LEG 03/22/2010   Qualifier: Diagnosis of  By: Rhonda Nimrod MD, Dellis Oneal    . Fibromyalgia   . IBS (irritable bowel syndrome)   . Morbid obesity ([place])     Past Surgical History:  Procedure Laterality Date  . CESAREAN SECTION    . TONSILLECTOMY AND ADENOIDECTOMY    . WISDOM TOOTH EXTRACTION      There were no vitals filed for this visit.   Subjective Assessment - 05/11/20 1533    Subjective Pt just came from MD appointment with diagnosis of spinal stenosis L4-S1 and recommendation for aquatic therapy. pt also with sinus tarsus syndrome in Lt ankle and is now using boot and SPC    Patient Stated Goals decrease pain, be able to sleep and walk without pain    Currently in Pain? Yes    Pain Score 5     Pain Location Back    Pain Orientation Left    Pain Descriptors / Indicators Sore                             [facility] Adult PT Treatment/Exercise - 05/11/20 0001      Lumbar Exercises: Stretches   Active Hamstring Stretch Limitations nerve tensioner x 10 bilat      Lumbar Exercises: Supine   Ab Set 5 reps;5 seconds     Clam 10 reps    Clam Limitations red TB   with ab set with tactile cues   Heel Slides 10 reps    Heel Slides Limitations with ab set      Lumbar Exercises: Sidelying   Clam Left;20 reps    Hip Abduction Limitations continues with pain after 2 reps      Moist Heat Therapy   Number Minutes Moist Heat 10 Minutes    Moist Heat Location Lumbar Spine      Electrical Stimulation   Electrical Stimulation Location lumbar    Electrical Stimulation Action IFC    Electrical Stimulation Parameters to tolerance    Electrical Stimulation Goals Pain      Manual Therapy   Soft tissue mobilization STM bilat lumbar paraspinals and Lt glutes and piriformis to reduce pain                       PT Long Term Goals - 04/06/20 1647      PT LONG TERM GOAL #1   Title Pt will be independent with  HEP    Time 6    Period Weeks    Status New    Target Date 05/18/20      PT LONG TERM GOAL #2   Title Pt will improve FOTO to >= 70 to demo improved functional mobility    Time 6    Period Weeks    Status New    Target Date 05/18/20      PT LONG TERM GOAL #3   Title Pt will tolerate walking x 30 minutes with pain <= 2/10    Time 6    Period Weeks    Status New    Target Date 05/18/20      PT LONG TERM GOAL #4   Title Pt will improve LE strength to 4/5 bilat to tolerate walking and standing for IADLs    Time 6    Period Weeks    Status New                 Plan - 05/11/20 1624    Clinical Impression Statement Pt requires tactile cues for ab set, continues to be limited by pain with hip abduction. Pt now with walking boot and SPC which is causing postural impairments and increased pain. PT provided gait trainign for proper use of SPC,pt does report decreased pain with proper use of SPC. MD recommends aquatic therapy. Pt lives in [place] so she states she will look for therapy closer to home    PT Next Visit Plan continue to progress core strength. manual and modalities as  needed    PT Home Exercise Plan Access Code: [email or web address]           Patient will benefit from skilled therapeutic intervention in order to improve the following deficits and impairments:     Visit Diagnosis: Left-sided low back pain with left-sided sciatica, unspecified chronicity  Muscle weakness (generalized)  Difficulty in walking, not elsewhere classified     Problem List Patient Active Problem List   Diagnosis Date Noted  . Allergic rhinitis 03/14/2020  . Asthma 03/14/2020  . Fibromyalgia 03/14/2020  . Sciatic nerve pain 03/14/2020  . Trigger thumb, left thumb 04/02/2019  . Degenerative spondylolisthesis 12/07/2016  . Acute foreign body of right ear canal 11/05/2016  . Cough 11/05/2016  . Mild intermittent asthma with exacerbation 11/05/2016  . Pain of left sacroiliac joint 04/18/2016  . Trochanteric bursitis of both hips 03/16/2016  . History of asthma 01/17/2016  . Acute non-recurrent maxillary sinusitis 12/15/2015  . Ischial bursitis 12/15/2015  . Mid back pain 03/18/2015  . 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 Sickle cell trait ([place]) 01/20/2013  . Synovitis of foot 12/26/2012  . Arthralgia 12/08/2012  . 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  . Cervical spinal stenosis 11/12/2012  . Brachial neuritis 11/12/2012  . Left lumbar radiculopathy 05/13/2012  . Irritable bowel syndrome 11/29/2011  . Morbid obesity ([place])   . ALLERGIC RHINITIS, SEASONAL 06/08/2010  . EDEMA LEG 03/22/2010  . DERMATITIS, ATOPIC 02/09/2010  . Vitamin D deficiency 04/15/2008   PHYSICAL THERAPY DISCHARGE SUMMARY  Visits from Start of Care: 4  Current functional level related to goals / functional outcomes: Pt limited by new onset of sinus tarsus syndrome   Remaining deficits: Pain, decreased strength and mobility   Education / Equipment: HEP Plan: Patient agrees to discharge.  Patient goals were not met. Patient is being discharged due  to not returning since the last visit.  ?????  Rhonda Oneal, PT,DPT04/22/229:02 AM  Rhonda Oneal, PT  , 05/11/2020, 4:26 PM  [facility] [place], [place], [address] Phone: [phone]   Fax:  [phone]  Name: Rhonda Oneal MRN: [id number] Date of Birth: [birth year]

## 2020-05-11 NOTE — Patient Instructions (Signed)
Access Code: [email or web address] URL: [email or web address] Date: 05/11/2020 Prepared by: Reggy Eye  Exercises Clamshell - 1 x daily - 3 x weekly - 2 sets - 10 reps Sidelying Hip Abduction - 1 x daily - 3 x weekly - 2 sets - 10 reps Hooklying Transversus Abdominis Palpation - 1 x daily - 7 x weekly - 1 sets - 10 reps - 5 hold Supine Transversus Abdominis Bracing with Double Leg Fallout - 1 x daily - 7 x weekly - 1 sets - 10 reps Supine Transversus Abdominis Bracing with Leg Extension - 1 x daily - 7 x weekly - 1 sets - 10 reps Supine Hamstring Stretch - 1 x daily - 7 x weekly - 1 sets - 5 reps - 10 hold

## 2020-05-11 NOTE — Assessment & Plan Note (Signed)
Multilevel lumbar spinal stenosis with left-sided L5 radiculopathy, failed conservative measures, adding aquatic therapy and a left L5-S1 transforaminal epidural, return to see me 1 month after injection.

## 2020-05-12 ENCOUNTER — Encounter: Payer: Federal, State, Local not specified - PPO | Admitting: Rehabilitative and Restorative Service Providers"

## 2020-05-18 ENCOUNTER — Encounter: Payer: Federal, State, Local not specified - PPO | Admitting: Physical Therapy

## 2020-05-24 ENCOUNTER — Ambulatory Visit
Admission: RE | Admit: 2020-05-24 | Discharge: 2020-05-24 | Disposition: A | Discharge status: Home / Self Care | Payer: Federal, State, Local not specified - PPO | Source: Ambulatory Visit | Attending: Sports Medicine | Admitting: Sports Medicine

## 2020-05-24 DIAGNOSIS — M5416 Radiculopathy, lumbar region: Secondary | ICD-10-CM

## 2020-05-24 MED ORDER — METHYLPREDNISOLONE ACETATE 40 MG/ML INJ SUSP (RADIOLOG
80.0000 mg | Freq: Once | INTRAMUSCULAR | Status: AC
  Administered 2020-05-24: 80 mg via EPIDURAL

## 2020-05-24 MED ORDER — IOPAMIDOL (ISOVUE-M 200) INJECTION 41%
1.0000 mL | Freq: Once | INTRAMUSCULAR | Status: AC
  Administered 2020-05-24: 1 mL via EPIDURAL

## 2020-05-24 NOTE — Discharge Instructions (Signed)

## 2020-05-25 ENCOUNTER — Encounter: Payer: Federal, State, Local not specified - PPO | Admitting: Physical Therapy

## 2020-05-27 ENCOUNTER — Encounter: Payer: Federal, State, Local not specified - PPO | Admitting: Physical Therapy

## 2020-07-27 ENCOUNTER — Encounter: Payer: Self-pay | Admitting: Emergency Medicine

## 2020-07-27 ENCOUNTER — Emergency Department
Admission: EM | Admit: 2020-07-27 | Discharge: 2020-07-27 | Disposition: A | Discharge status: Home / Self Care | Payer: Federal, State, Local not specified - PPO | Source: Home / Self Care

## 2020-07-27 ENCOUNTER — Other Ambulatory Visit: Payer: Self-pay

## 2020-07-27 DIAGNOSIS — H6593 Unspecified nonsuppurative otitis media, bilateral: Secondary | ICD-10-CM | POA: Diagnosis not present

## 2020-07-27 MED ORDER — PREDNISONE 10 MG (21) PO TBPK: ORAL_TABLET | Freq: Every day | ORAL | 0 refills | Status: DC

## 2020-07-27 NOTE — Discharge Instructions (Addendum)
Take tapered steroid as directed. Continue taking Zyrtec and Flonase.

## 2020-07-27 NOTE — ED Provider Notes (Signed)
[facility]  ____________________________________________  Time seen: Approximately 11:14 AM  I have reviewed the triage vital signs and the nursing notes.   HISTORY  Chief Complaint Otalgia (bilateral)   Historian Patient     HPI Rhonda Oneal is a 55 y.o. female presents to the emergency department with bilateral ear pain, left side worse than right.  Patient reports that she recently finished a course of Omnicef but her pain is not resolved.  Patient denies fever and chills.  She has had some muffled hearing.  No discharge from bilateral ears.  No recent swimming.   Past Medical History:  Diagnosis Date  . Allergy to mold   . Arthritis   . Asthma   . DERMATITIS, ATOPIC 02/09/2010   Qualifier: Diagnosis of  By: Orson Aloe MD, Tinnie Gens    . EDEMA LEG 03/22/2010   Qualifier: Diagnosis of  By: Orson Aloe MD, Tinnie Gens    . Fibromyalgia   . IBS (irritable bowel syndrome)   . Morbid obesity (HCC)      Immunizations up to date:  Yes.     Past Medical History:  Diagnosis Date  . Allergy to mold   . Arthritis   . Asthma   . DERMATITIS, ATOPIC 02/09/2010   Qualifier: Diagnosis of  By: Orson Aloe MD, Tinnie Gens    . EDEMA LEG 03/22/2010   Qualifier: Diagnosis of  By: Orson Aloe MD, Tinnie Gens    . Fibromyalgia   . IBS (irritable bowel syndrome)   . Morbid obesity [facility])     Patient Active Problem List   Diagnosis Date Noted  . Allergic rhinitis 03/14/2020  . Asthma 03/14/2020  . Fibromyalgia 03/14/2020  . Sciatic nerve pain 03/14/2020  . Trigger thumb, left thumb 04/02/2019  . Degenerative spondylolisthesis 12/07/2016  . Acute foreign body of right ear canal 11/05/2016  . Cough 11/05/2016  . Mild intermittent asthma with exacerbation 11/05/2016  . Pain of left sacroiliac joint 04/18/2016  . Trochanteric bursitis of both hips 03/16/2016  . History of asthma 01/17/2016  . Acute non-recurrent maxillary sinusitis 12/15/2015  . Ischial bursitis 12/15/2015   . Mid back pain 03/18/2015  . 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 Sickle cell trait (HCC) 01/20/2013  . Synovitis of foot 12/26/2012  . Arthralgia 12/08/2012  . 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  . Cervical spinal stenosis 11/12/2012  . Brachial neuritis 11/12/2012  . Left lumbar radiculopathy 05/13/2012  . Irritable bowel syndrome 11/29/2011  . Morbid obesity (HCC)   . ALLERGIC RHINITIS, SEASONAL 06/08/2010  . EDEMA LEG 03/22/2010  . DERMATITIS, ATOPIC 02/09/2010  . Vitamin D deficiency 04/15/2008    Past Surgical History:  Procedure Laterality Date  . CESAREAN SECTION    . TONSILLECTOMY AND ADENOIDECTOMY    . WISDOM TOOTH EXTRACTION      Prior to Admission medications   Medication Sig Start Date End Date Taking? Authorizing Provider  baclofen (LIORESAL) 10 MG tablet TAKE 1 TABLET BY MOUTH EVERY DAY 12/04/18  Yes [provider]  furosemide (LASIX) 20 MG tablet Take 1 tablet by mouth daily. 01/01/20  Yes [provider]  losartan-hydrochlorothiazide [name]) 50-12.5 MG tablet Take by mouth. 12/03/19 12/02/20 Yes [provider]  metFORMIN (GLUCOPHAGE-XR) 500 MG 24 hr tablet Take by mouth. 07/12/20  Yes [provider]  omeprazole (PRILOSEC) 40 MG capsule Take by mouth. 05/01/20  Yes [provider]  pantoprazole (PROTONIX) 40 MG tablet Take by mouth. 11/23/19 11/22/20 Yes [provider]  pravastatin (PRAVACHOL) 20 MG tablet Take 20 mg by mouth at bedtime. 03/19/19  Yes [provider]  predniSONE (STERAPRED UNI-PAK 21 TAB) 10 MG (21) TBPK tablet Take by mouth daily. 6,5,4,3,2,1 07/27/20  Yes Pia Mau M, PA-C  traMADol (ULTRAM) 50 MG tablet Take by mouth every 6 (six) hours as needed.   Yes [provider]  AMBULATORY NON FORMULARY MEDICATION One nebulizer machine with accesories for mild persistent asthma 11/01/16   Breeback, Jade L, PA-C  BYSTOLIC 10 MG tablet  Take 10 mg by mouth daily. Patient not taking: Reported on 07/27/2020 01/31/19   [provider]  cyclobenzaprine (FLEXERIL) 10 MG tablet Take 0.5-1 tablets (5-10 mg total) by mouth 3 (three) times daily as needed for muscle spasms. 09/17/16   Sunnie Nielsen, DO  diclofenac Sodium (VOLTAREN) 1 % GEL APPLY TO AFFECTED AREA 3 TIMES A DAY 12/14/19   [provider]  doxycycline (VIBRAMYCIN) 100 MG capsule Take one cap PO Q12hr with food. Patient not taking: Reported on 04/06/2020 03/14/20   Lattie Haw, MD  ibuprofen (ADVIL) 800 MG tablet TAKE 1 TABLET BY MOUTH EVERY 8 HOURS AS NEEDED FOR PAIN Patient not taking: Reported on 07/27/2020 01/07/19   [provider]  ipratropium (ATROVENT) 0.03 % nasal spray Place into the nose. Patient not taking: Reported on 07/27/2020 08/06/19 08/05/20  [provider]  levocetirizine (XYZAL) 5 MG tablet Take by mouth. 11/23/19 11/22/20  [provider]  lidocaine (LIDODERM) 5 % PLACE 1 PATCH ON THE SKIN DAILY FOR 30 DAY REMOVE & DISCARD PATCH WITHIN 12 HRS OR AS DIRECTED BY MD 02/15/20   [provider]  pregabalin (LYRICA) 150 MG capsule Take 1 capsule (150 mg total) by mouth 2 (two) times daily. Patient not taking: Reported on 07/27/2020 05/04/20   Monica Becton, MD  triazolam (HALCION) 0.25 MG tablet 1-2 tabs PO 2 hours before procedure or imaging.  Do not drive with this medication. 05/04/20   Monica Becton, MD    Allergies Doxycycline, Cymbalta [duloxetine hcl], and Ferrous sulfate  Family History  Problem Relation Age of Onset  . Diabetes Mother   . Cancer Mother        Lung  . Hyperlipidemia Father   . Breast cancer Maternal Aunt   . Ovarian cancer Paternal Aunt     Social History Social History   Tobacco Use  . Smoking status: Never Smoker  . Smokeless tobacco: Never Used  Vaping Use  . Vaping Use: Never used  Substance Use Topics  . Alcohol use: No  . Drug use: No      Review of Systems  Constitutional: No fever/chills Eyes:  No discharge ENT: Patient has bilateral otalgia.  Respiratory: no cough. No SOB/ use of accessory muscles to breath Gastrointestinal:   No nausea, no vomiting.  No diarrhea.  No constipation. Musculoskeletal: Negative for musculoskeletal pain. Skin: Negative for rash, abrasions, lacerations, ecchymosis.    ____________________________________________   PHYSICAL EXAM:  VITAL SIGNS: ED Triage Vitals  Enc Vitals Group     BP 07/27/20 1023 130/82     Pulse Rate 07/27/20 1023 98     Resp 07/27/20 1023 17     Temp 07/27/20 1023 98.7 F (37.1 C)     Temp Source 07/27/20 1023 Oral     SpO2 07/27/20 1023 100 %     Weight --      Height --      Head Circumference --  Peak Flow --      Pain Score 07/27/20 1026 4     Pain Loc --      Pain Edu? --      Excl. in GC? --      Constitutional: Alert and oriented. Well appearing and in no acute distress. Eyes: Conjunctivae are normal. PERRL. EOMI. Head: Atraumatic. ENT:      Ears: TMs are effused bilaterally without evidence of otitis media.      Nose: No congestion/rhinnorhea.      Mouth/Throat: Mucous membranes are moist.  Neck: No stridor.  No cervical spine tenderness to palpation. Cardiovascular: Normal rate, regular rhythm. Normal S1 and S2.  Good peripheral circulation. Respiratory: Normal respiratory effort without tachypnea or retractions. Lungs CTAB. Good air entry to the bases with no decreased or absent breath sounds Gastrointestinal: Bowel sounds x 4 quadrants. Soft and nontender to palpation. No guarding or rigidity. No distention. Musculoskeletal: Full range of motion to all extremities. No obvious deformities noted Neurologic:  Normal for age. No gross focal neurologic deficits are appreciated.  Skin:  Skin is warm, dry and intact. No rash noted. Psychiatric: Mood and affect are normal for age. Speech and behavior are normal.    ____________________________________________   LABS (all labs ordered are listed, but only abnormal results are displayed)  Labs Reviewed - No data to display ____________________________________________  EKG   ____________________________________________  RADIOLOGY   No results found.  ____________________________________________    PROCEDURES  Procedure(s) performed:     Procedures     Medications - No data to display   ____________________________________________   INITIAL IMPRESSION / ASSESSMENT AND PLAN / ED COURSE  Pertinent labs & imaging results that were available during my care of the patient were reviewed by me and considered in my medical decision making (see chart for details).     Assessment and plan Middle ear effusion 55 year old female presents to the urgent care with bilateral otalgia for the past 3 weeks.  Vital signs were reassuring at triage.  On exam, patient had bilateral middle ear effusions but no signs of otitis media.  We will start patient on a prednisone taper as she is already taking Zyrtec and Flonase.  Return precautions were given to return with new or worsening symptoms.      ____________________________________________  FINAL CLINICAL IMPRESSION(S) / ED DIAGNOSES  Final diagnoses:  Fluid level behind tympanic membrane of both ears      NEW MEDICATIONS STARTED DURING THIS VISIT:  ED Discharge Orders         Ordered    predniSONE (STERAPRED UNI-PAK 21 TAB) 10 MG (21) TBPK tablet  Daily        07/27/20 1112              This chart was dictated using voice recognition software/Dragon. Despite best efforts to proofread, errors can occur which can change the meaning. Any change was purely unintentional.     Orvil Feil, PA-C 07/27/20 1116

## 2020-07-27 NOTE — ED Triage Notes (Addendum)
Left ear pain x 3 weeks - right ear also hurts more this last week Pt finished antibiotics 9 days ago  Denies fever or chills  Generalized sinus headache x 3 weeks Taking zyrtec, flonase & tylenol - min relief  No meds today  COVID 10/21 Negative COVID home test last weekend

## 2020-08-10 ENCOUNTER — Other Ambulatory Visit: Payer: Self-pay

## 2020-08-10 ENCOUNTER — Encounter: Payer: Self-pay | Admitting: Emergency Medicine

## 2020-08-10 ENCOUNTER — Emergency Department
Admission: EM | Admit: 2020-08-10 | Discharge: 2020-08-10 | Disposition: A | Discharge status: Home / Self Care | Payer: Federal, State, Local not specified - PPO | Source: Home / Self Care

## 2020-08-10 DIAGNOSIS — M546 Pain in thoracic spine: Secondary | ICD-10-CM | POA: Diagnosis not present

## 2020-08-10 LAB — POCT URINALYSIS DIP (MANUAL ENTRY)
Bilirubin, UA: NEGATIVE
Blood, UA: NEGATIVE
Glucose, UA: NEGATIVE mg/dL
Ketones, POC UA: NEGATIVE mg/dL
Leukocytes, UA: NEGATIVE
Nitrite, UA: NEGATIVE
Protein Ur, POC: NEGATIVE mg/dL
Spec Grav, UA: 1.015 (ref 1.010–1.025)
Urobilinogen, UA: 0.2 E.U./dL
pH, UA: 6 (ref 5.0–8.0)

## 2020-08-10 MED ORDER — PREDNISONE 10 MG (21) PO TBPK: ORAL_TABLET | ORAL | 0 refills | Status: DC

## 2020-08-10 MED ORDER — TIZANIDINE HCL 4 MG PO CAPS: 4.0000 mg | ORAL_CAPSULE | Freq: Three times a day (TID) | ORAL | 0 refills | Status: DC

## 2020-08-10 NOTE — Discharge Instructions (Addendum)
I believe that you have strained a muscle.  I have called in tizanidine which is a different muscle relaxer that you can take up to 3 times a day.  This will make you sleepy so do not drive or drink alcohol with it.  You should not take baclofen on the same days as taking tizanidine as they are similar medications and it would be too much medicine.  I have also called in a prednisone taper since you have taken ibuprofen and Tylenol without improvement.  While you are taking prednisone do not take ibuprofen, aspirin, Aleve as this can cause stomach bleeding.  You can use Tylenol for breakthrough pain.  Continue with topical medications.  If anything worsens please return for reevaluation.

## 2020-08-10 NOTE — ED Triage Notes (Signed)
Patient c/o right sided back upper shoulder blade pain x 3-4 days.  No apparent injury to the area.  Patient has taken Ibuprofen and Tylenol.

## 2020-08-10 NOTE — ED Provider Notes (Signed)
[name]    CSN: [id number] Arrival date & time: 08/10/20  1625      History   Chief Complaint Chief Complaint  Patient presents with   Back Pain    HPI Rhonda Oneal is a 55 y.o. female.   Patient presents today with a 3 to 4-day history of right-sided thoracic back pain.  She denies any known injury or increase in activity prior to symptom onset.  Reports pain is rated 8 on a 0-10 pain scale, localized to right thoracic back without radiation, described as aching periodic sharp pains, worse with certain movements, no alleviating factors identified.  Patient denies history of previous back injury or spinal surgery.  She does have a history of fibromyalgia but states current symptoms are more intense than typical fibromyalgia flares.  She has been using Tylenol, ibuprofen, heat, lidocaine patches with minimal improvement of symptoms.  She denies any urinary symptoms including frequency, hematuria, urinary urgency, abdominal pain, nausea, vomiting, fever.  She denies history of nephrolithiasis.  She is having difficulty with daily activities as a result of symptoms.   Past Medical History:  Diagnosis Date   Allergy to mold    Arthritis    Asthma    DERMATITIS, ATOPIC 02/09/2010   Qualifier: Diagnosis of  By: Orson Aloe MD, Tinnie Gens     EDEMA LEG 03/22/2010   Qualifier: Diagnosis of  By: Orson Aloe MD, Tinnie Gens     Fibromyalgia    IBS (irritable bowel syndrome)    Morbid obesity (HCC)     Patient Active Problem List   Diagnosis Date Noted   Allergic rhinitis 03/14/2020   Asthma 03/14/2020   Fibromyalgia 03/14/2020   Sciatic nerve pain 03/14/2020   Trigger thumb, left thumb 04/02/2019   Degenerative spondylolisthesis 12/07/2016   Acute foreign body of right ear canal 11/05/2016   Cough 11/05/2016   Mild intermittent asthma with exacerbation 11/05/2016   Pain of left sacroiliac joint 04/18/2016   Trochanteric bursitis of both hips 03/16/2016   History of  asthma 01/17/2016   Acute non-recurrent maxillary sinusitis 12/15/2015   Ischial bursitis 12/15/2015   Mid back pain 03/18/2015   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   Sickle cell trait (HCC) 01/20/2013   Synovitis of foot 12/26/2012   Arthralgia 12/08/2012   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   Cervical spinal stenosis 11/12/2012   Brachial neuritis 11/12/2012   Left lumbar radiculopathy 05/13/2012   Irritable bowel syndrome 11/29/2011   Morbid obesity (HCC)    ALLERGIC RHINITIS, SEASONAL 06/08/2010   EDEMA LEG 03/22/2010   DERMATITIS, ATOPIC 02/09/2010   Vitamin D deficiency 04/15/2008    Past Surgical History:  Procedure Laterality Date   CESAREAN SECTION     TONSILLECTOMY AND ADENOIDECTOMY     WISDOM TOOTH EXTRACTION      OB History     Gravida  2   Para  2   Term      Preterm      AB      Living         SAB      IAB      Ectopic      Multiple      Live Births               Home Medications    Prior to Admission medications   Medication Sig Start Date End Date Taking? Authorizing Provider  AMBULATORY NON FORMULARY MEDICATION One nebulizer  machine with accesories for mild persistent asthma 11/01/16  Yes Breeback, Jade L, PA-C  baclofen (LIORESAL) 10 MG tablet TAKE 1 TABLET BY MOUTH EVERY DAY 12/04/18  Yes [provider]  diclofenac Sodium (VOLTAREN) 1 % GEL APPLY TO AFFECTED AREA 3 TIMES A DAY 12/14/19  Yes [provider]  levocetirizine (XYZAL) 5 MG tablet Take by mouth. 11/23/19 11/22/20 Yes [provider]  lidocaine (LIDODERM) 5 % PLACE 1 PATCH ON THE SKIN DAILY FOR 30 DAY REMOVE & DISCARD PATCH WITHIN 12 HRS OR AS DIRECTED BY MD 02/15/20  Yes [provider]  losartan-hydrochlorothiazide (HYZAAR) 50-12.5 MG tablet Take by mouth. 12/03/19 12/02/20 Yes [provider]  metFORMIN (GLUCOPHAGE-XR) 500 MG 24 hr tablet Take by mouth. 07/12/20  Yes [provider]  omeprazole (PRILOSEC) 40 MG capsule Take by mouth. 05/01/20  Yes [provider]  pantoprazole (PROTONIX) 40 MG tablet Take by mouth. 11/23/19 11/22/20 Yes [provider]  pravastatin (PRAVACHOL) 20 MG tablet Take 20 mg by mouth at bedtime. 03/19/19  Yes [provider]  predniSONE (STERAPRED UNI-PAK 21 TAB) 10 MG (21) TBPK tablet As directed 08/10/20  Yes Jerzey Komperda K, PA-C  tiZANidine (ZANAFLEX) 4 MG capsule Take 1 capsule (4 mg total) by mouth 3 (three) times daily. 08/10/20  Yes Koraline Phillipson K, PA-C  traMADol (ULTRAM) 50 MG tablet Take by mouth every 6 (six) hours as needed.   Yes [provider]  BYSTOLIC 10 MG tablet Take 10 mg by mouth daily. Patient not taking: Reported on 07/27/2020 01/31/19   [provider]  doxycycline (VIBRAMYCIN) 100 MG capsule Take one cap PO Q12hr with food. Patient not taking: Reported on 04/06/2020 03/14/20   Lattie Haw, MD  furosemide (LASIX) 20 MG tablet Take 1 tablet by mouth daily. 01/01/20   [provider]  ibuprofen (ADVIL) 800 MG tablet TAKE 1 TABLET BY MOUTH EVERY 8 HOURS AS NEEDED FOR PAIN Patient not taking: Reported on 07/27/2020 01/07/19   [provider]  pregabalin (LYRICA) 150 MG capsule Take 1 capsule (150 mg total) by mouth 2 (two) times daily. Patient not taking: Reported on 07/27/2020 05/04/20   Monica Becton, MD    Family History Family History  Problem Relation Age of Onset   Diabetes Mother    Cancer Mother        Lung   Hyperlipidemia Father    Breast cancer Maternal Aunt    Ovarian cancer Paternal Aunt     Social History Social History   Tobacco Use   Smoking status: Never   Smokeless tobacco: Never  Vaping Use   Vaping Use: Never used  Substance Use Topics   Alcohol use: No   Drug use: No     Allergies   Doxycycline, Cymbalta [duloxetine hcl], and Ferrous sulfate   Review of Systems Review of Systems  Constitutional:  Positive  for activity change. Negative for appetite change, fatigue and fever.  Respiratory:  Negative for cough and shortness of breath.   Cardiovascular:  Negative for chest pain.  Gastrointestinal:  Negative for abdominal pain, diarrhea, nausea and vomiting.  Genitourinary:  Negative for dysuria, frequency, hematuria and urgency.  Musculoskeletal:  Positive for back pain. Negative for arthralgias and myalgias.  Neurological:  Negative for dizziness, light-headedness, numbness and headaches.    Physical Exam Triage Vital Signs ED Triage Vitals  Enc Vitals Group     BP 08/10/20 1641 121/81     Pulse Rate 08/10/20 1641 Marland Kitchen)  103     Resp --      Temp --      Temp src --      SpO2 08/10/20 1641 100 %     Weight --      Height --      Head Circumference --      Peak Flow --      Pain Score 08/10/20 1642 8     Pain Loc --      Pain Edu? --      Excl. in GC? --    No data found.  Updated Vital Signs BP 121/81 (BP Location: Right Arm)   Pulse (!) 103   SpO2 100%   Visual Acuity Right Eye Distance:   Left Eye Distance:   Bilateral Distance:    Right Eye Near:   Left Eye Near:    Bilateral Near:     Physical Exam Vitals reviewed.  Constitutional:      General: She is awake. She is not in acute distress.    Appearance: Normal appearance. She is normal weight. She is not ill-appearing.     Comments: Very pleasant female appears stated age in no acute distress sitting comfortably in exam room  HENT:     Head: Normocephalic and atraumatic.  Cardiovascular:     Rate and Rhythm: Normal rate and regular rhythm.     Heart sounds: Normal heart sounds, S1 normal and S2 normal. No murmur heard. Pulmonary:     Effort: Pulmonary effort is normal.     Breath sounds: Normal breath sounds. No wheezing, rhonchi or rales.     Comments: Clear to auscultation bilaterally Abdominal:     General: Bowel sounds are normal.     Palpations: Abdomen is soft.     Tenderness: There is no abdominal  tenderness. There is right CVA tenderness. There is no left CVA tenderness, guarding or rebound.     Comments: Mild CVA tenderness on right.  Musculoskeletal:     Cervical back: No tenderness or bony tenderness.     Thoracic back: Tenderness present. No bony tenderness.     Lumbar back: No tenderness or bony tenderness.     Comments: Back: Decreased range of motion with forward flexion.  Tenderness palpation of right thoracic paraspinal muscles.  No pain percussion of vertebrae.  No deformity or step-off noted.  Psychiatric:        Behavior: Behavior is cooperative.     UC Treatments / Results  Labs (all labs ordered are listed, but only abnormal results are displayed) Labs Reviewed  POCT URINALYSIS DIP (MANUAL ENTRY)    EKG   Radiology No results found.  Procedures Procedures (including critical care time)  Medications Ordered in UC Medications - No data to display  Initial Impression / Assessment and Plan / UC Course  I have reviewed the triage vital signs and the nursing notes.  Pertinent labs & imaging results that were available during my care of the patient were reviewed by me and considered in my medical decision making (see chart for details).     UA was normal with no evidence of hematuria making nephrolithiasis less likely. We will treat for musculoskeletal etiology.  Patient was prescribed Zanaflex to be used up to 3 times a day as he was instructed not to drive or drink alcohol with this medication as drowsiness is a common side effect.  She will prescribe prednisone taper with instruction not to take NSAIDs including aspirin, Profen/Advil, naproxen/Aleve  due to risk of GI bleeding.  She can use Tylenol for breakthrough pain.  Encouraged her to use conservative treatment measures including topical medication, heat, rest, stretch.  I discussed alarm symptoms or warrant emergent evaluation.  Strict return precautions given to which patient expressed understanding.     Final Clinical Impressions(s) / UC Diagnoses   Final diagnoses:  Acute right-sided thoracic back pain     Discharge Instructions       I believe that you have strained a muscle.  I have called in tizanidine which is a different muscle relaxer that you can take up to 3 times a day.  This will make you sleepy so do not drive or drink alcohol with it.  You should not take baclofen on the same days as taking tizanidine as they are similar medications and it would be too much medicine.  I have also called in a prednisone taper since you have taken ibuprofen and Tylenol without improvement.  While you are taking prednisone do not take ibuprofen, aspirin, Aleve as this can cause stomach bleeding.  You can use Tylenol for breakthrough pain.  Continue with topical medications.  If anything worsens please return for reevaluation.     ED Prescriptions     Medication Sig Dispense Auth. Provider   predniSONE (STERAPRED UNI-PAK 21 TAB) 10 MG (21) TBPK tablet As directed 21 tablet Aryka Coonradt K, PA-C   tiZANidine (ZANAFLEX) 4 MG capsule Take 1 capsule (4 mg total) by mouth 3 (three) times daily. 21 capsule Destane Speas K, PA-C      PDMP not reviewed this encounter.   Jeani Hawking, PA-C 08/10/20 1708

## 2020-08-12 ENCOUNTER — Ambulatory Visit: Payer: Federal, State, Local not specified - PPO | Admitting: Sports Medicine

## 2020-10-12 ENCOUNTER — Other Ambulatory Visit: Payer: Self-pay

## 2020-10-12 ENCOUNTER — Ambulatory Visit (INDEPENDENT_AMBULATORY_CARE_PROVIDER_SITE_OTHER): Payer: Federal, State, Local not specified - PPO | Admitting: Sports Medicine

## 2020-10-12 DIAGNOSIS — M5416 Radiculopathy, lumbar region: Secondary | ICD-10-CM

## 2020-10-12 NOTE — Assessment & Plan Note (Signed)
This is a 55 year old female, she has multilevel lumbar spinal stenosis with a left-sided L5 radiculopathy, she at this point has had predominantly left lower leg burning type symptoms. 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 Ultimately she followed our orders and got a left L5-S1 transforaminal epidural with [facility] that seem to work extremely well, she is now having a recurrence of left leg burning symptoms, so she is being set up for another lumbar epidural. Return to see me as needed, she can continue with [facility] for pain management and epidural injections.

## 2020-10-12 NOTE — Progress Notes (Signed)
    Procedures performed today:    None.  Independent interpretation of notes and tests performed by another provider:   None.  Brief History, Exam, Impression, and Recommendations:    Left lumbar radiculopathy This is a 55 year old female, she has multilevel lumbar spinal stenosis with a left-sided L5 radiculopathy, she at this point has had predominantly left lower leg burning type symptoms. 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 Ultimately she followed our orders and got a left L5-S1 transforaminal epidural with [facility] that seem to work extremely well, she is now having a recurrence of left leg burning symptoms, so she is being set up for another lumbar epidural. Return to see me as needed, she can continue with [facility] for pain management and epidural injections.    ___________________________________________ Ihor Austin. Benjamin Stain, M.D., ABFM., CAQSM. Primary Care and Sports Medicine [facility]  Adjunct Instructor of Family Medicine  [employer]

## 2020-11-05 ENCOUNTER — Other Ambulatory Visit: Payer: Self-pay

## 2020-11-05 ENCOUNTER — Emergency Department (INDEPENDENT_AMBULATORY_CARE_PROVIDER_SITE_OTHER)
Admission: EM | Admit: 2020-11-05 | Discharge: 2020-11-05 | Disposition: A | Discharge status: Home / Self Care | Payer: Federal, State, Local not specified - PPO | Source: Home / Self Care | Attending: Family Medicine | Admitting: Family Medicine

## 2020-11-05 DIAGNOSIS — J0101 Acute recurrent maxillary sinusitis: Secondary | ICD-10-CM | POA: Diagnosis not present

## 2020-11-05 DIAGNOSIS — J3489 Other specified disorders of nose and nasal sinuses: Secondary | ICD-10-CM | POA: Diagnosis not present

## 2020-11-05 MED ORDER — PREDNISONE 20 MG PO TABS: 20.0000 mg | ORAL_TABLET | Freq: Two times a day (BID) | ORAL | 0 refills | Status: DC

## 2020-11-05 MED ORDER — CEFDINIR 300 MG PO CAPS: 300.0000 mg | ORAL_CAPSULE | Freq: Two times a day (BID) | ORAL | 0 refills | Status: DC

## 2020-11-05 NOTE — ED Triage Notes (Signed)
Pt presents to Urgent Care with c/o headache, facial pain, and dizziness x approx 2 weeks. Reports sinus drainage at beginning of symptom onset.

## 2020-11-05 NOTE — ED Provider Notes (Signed)
[name]    CSN: [id number] Arrival date & time: 11/05/20  0931      History   Chief Complaint Chief Complaint  Patient presents with   Headache   Facial Pain   Dizziness    HPI Rhonda Oneal is a 55 y.o. female.   HPI  Patient states that she has facial pain.  Is been getting worse over the last few days.  She started with cold symptoms 2 weeks ago.  They have never improved.  She has runny nose, stuffy nose, sinus pressure and pain, sore throat, and slight cough.  No fever or chills.  No headache or body ache.  She has had sinus infections before and states she is to the point where she feels like she needs an antibiotic.  She has been using Flonase and saline Xyzal for allergy symptoms. It is noted that her blood pressure is elevated today.  Patient states that she is on a blood pressure medicine but has not yet taken it today. Otherwise she is plagued with a multitude of orthopedic problems, fibromyalgia, IBS, and sickle trait.  Past Medical History:  Diagnosis Date   Allergy to mold    Arthritis    Asthma    DERMATITIS, ATOPIC 02/09/2010   Qualifier: Diagnosis of  By: Orson Aloe MD, Tinnie Gens     EDEMA LEG 03/22/2010   Qualifier: Diagnosis of  By: Orson Aloe MD, Tinnie Gens     Fibromyalgia    IBS (irritable bowel syndrome)    Morbid obesity (HCC)     Patient Active Problem List   Diagnosis Date Noted   Allergic rhinitis 03/14/2020   Asthma 03/14/2020   Fibromyalgia 03/14/2020   Sciatic nerve pain 03/14/2020   Trigger thumb, left thumb 04/02/2019   Degenerative spondylolisthesis 12/07/2016   Mild intermittent asthma with exacerbation 11/05/2016   Pain of left sacroiliac joint 04/18/2016   Trochanteric bursitis of both hips 03/16/2016   History of asthma 01/17/2016   Ischial bursitis 12/15/2015   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   Sickle cell trait (HCC) 01/20/2013   Synovitis of foot 12/26/2012   Arthralgia  12/08/2012   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   Cervical spinal stenosis 11/12/2012   Brachial neuritis 11/12/2012   Left lumbar radiculopathy 05/13/2012   Irritable bowel syndrome 11/29/2011   Morbid obesity (HCC)    ALLERGIC RHINITIS, SEASONAL 06/08/2010   EDEMA LEG 03/22/2010   DERMATITIS, ATOPIC 02/09/2010   Vitamin D deficiency 04/15/2008    Past Surgical History:  Procedure Laterality Date   CESAREAN SECTION     TONSILLECTOMY AND ADENOIDECTOMY     WISDOM TOOTH EXTRACTION      OB History     Gravida  2   Para  2   Term      Preterm      AB      Living         SAB      IAB      Ectopic      Multiple      Live Births               Home Medications    Prior to Admission medications   Medication Sig Start Date End Date Taking? Authorizing Provider  cefdinir (OMNICEF) 300 MG capsule Take 1 capsule (300 mg total) by mouth 2 (two) times daily. 11/05/20  Yes Eustace Moore, MD  baclofen (LIORESAL) 10 MG tablet TAKE  1 TABLET BY MOUTH EVERY DAY 12/04/18   [provider]  diclofenac Sodium (VOLTAREN) 1 % GEL APPLY TO AFFECTED AREA 3 TIMES A DAY 12/14/19   [provider]  furosemide (LASIX) 20 MG tablet Take 1 tablet by mouth daily. 01/01/20   [provider]  ibuprofen (ADVIL) 800 MG tablet  01/07/19   [provider]  levocetirizine (XYZAL) 5 MG tablet Take by mouth. 11/23/19 11/22/20  [provider]  lidocaine (LIDODERM) 5 % PLACE 1 PATCH ON THE SKIN DAILY FOR 30 DAY REMOVE & DISCARD PATCH WITHIN 12 HRS OR AS DIRECTED BY MD 02/15/20   [provider]  losartan-hydrochlorothiazide (HYZAAR) 50-12.5 MG tablet Take by mouth. 12/03/19 12/02/20  [provider]  metFORMIN (GLUCOPHAGE-XR) 500 MG 24 hr tablet Take by mouth. 07/12/20   [provider]  omeprazole (PRILOSEC) 40 MG capsule Take by mouth. 05/01/20   [provider]  pantoprazole (PROTONIX) 40 MG tablet  Take by mouth. 11/23/19 11/22/20  [provider]  pravastatin (PRAVACHOL) 20 MG tablet Take 20 mg by mouth at bedtime. 03/19/19   [provider]  predniSONE (DELTASONE) 20 MG tablet Take 1 tablet (20 mg total) by mouth 2 (two) times daily with a meal. 11/05/20   Eustace Moore, MD  pregabalin (LYRICA) 150 MG capsule Take 1 capsule (150 mg total) by mouth 2 (two) times daily. 05/04/20   Monica Becton, MD  tiZANidine (ZANAFLEX) 4 MG capsule Take 1 capsule (4 mg total) by mouth 3 (three) times daily. 08/10/20   Raspet, Noberto Retort, PA-C    Family History Family History  Problem Relation Age of Onset   Diabetes Mother    Cancer Mother        Lung   Hyperlipidemia Father    Breast cancer Maternal Aunt    Ovarian cancer Paternal Aunt     Social History Social History   Tobacco Use   Smoking status: Never   Smokeless tobacco: Never  Vaping Use   Vaping Use: Never used  Substance Use Topics   Alcohol use: No   Drug use: No     Allergies   Doxycycline, Cymbalta [duloxetine hcl], and Ferrous sulfate   Review of Systems Review of Systems See HPI  Physical Exam Triage Vital Signs ED Triage Vitals  Enc Vitals Group     BP 11/05/20 0948 (!) 143/94     Pulse Rate 11/05/20 0948 97     Resp 11/05/20 0948 20     Temp 11/05/20 0948 97.8 F (36.6 C)     Temp Source 11/05/20 0948 Oral     SpO2 11/05/20 0948 100 %     Weight 11/05/20 0941 252 lb (114.3 kg)     Height 11/05/20 0941 5\' 1"  (1.549 m)     Head Circumference --      Peak Flow --      Pain Score 11/05/20 0940 9     Pain Loc --      Pain Edu? --      Excl. in GC? --    No data found.  Updated Vital Signs BP (!) 143/94 (BP Location: Left Arm)   Pulse 97   Temp 97.8 F (36.6 C) (Oral)   Resp 20   Ht 5\' 1"  (1.549 m)   Wt 114.3 kg   LMP 10/10/2020 (Approximate)   SpO2 100%   BMI 47.61 kg/m      Physical Exam Constitutional:  General: She is not in acute distress.    Appearance:  She is well-developed. She is obese. She is ill-appearing.  HENT:     Head: Normocephalic and atraumatic.     Right Ear: Tympanic membrane, ear canal and external ear normal.     Left Ear: Tympanic membrane, ear canal and external ear normal.     Nose: Congestion and rhinorrhea present.     Mouth/Throat:     Pharynx: Posterior oropharyngeal erythema present.     Comments: Facial sinuses frontal, ethmoid, and maxillary are all very tender to even light pressure.  Nasal membranes are congested and red.  Scant thick white PND seen in posterior pharynx Eyes:     Conjunctiva/sclera: Conjunctivae normal.     Pupils: Pupils are equal, round, and reactive to light.  Cardiovascular:     Rate and Rhythm: Normal rate and regular rhythm.     Heart sounds: Normal heart sounds.  Pulmonary:     Effort: Pulmonary effort is normal. No respiratory distress.     Breath sounds: Normal breath sounds.  Abdominal:     General: There is no distension.     Palpations: Abdomen is soft.  Musculoskeletal:        General: Normal range of motion.     Cervical back: Normal range of motion.  Skin:    General: Skin is warm and dry.  Neurological:     Mental Status: She is alert.  Psychiatric:        Mood and Affect: Mood normal.        Behavior: Behavior normal.     UC Treatments / Results  Labs (all labs ordered are listed, but only abnormal results are displayed) Labs Reviewed - No data to display  EKG   Radiology No results found.  Procedures Procedures (including critical care time)  Medications Ordered in UC Medications - No data to display  Initial Impression / Assessment and Plan / UC Course  I have reviewed the triage vital signs and the nursing notes.  Pertinent labs & imaging results that were available during my care of the patient were reviewed by me and considered in my medical decision making (see chart for details).     Patient has had symptoms for 2 weeks.  Likely started as  a viral sinusitis but at this point I agree that an antibiotic is likely indicated.  She states she usually does well on Augmentin but I explained that there is a Acupuncturist.  We will provide [employer] with instructions for use. Final Clinical Impressions(s) / UC Diagnoses   Final diagnoses:  Acute recurrent maxillary sinusitis  Pain of maxillary sinus     Discharge Instructions      Continue to drink lots of fluids Take antibiotic 2 times a day, take 2 doses today Use prednisone 2 times a day for 5 days Continue with your Flonase Take Tylenol or ibuprofen for moderate pain, may take your hydrocodone for severe pain Call your doctor if not improving by next week    ED Prescriptions     Medication Sig Dispense Auth. Provider   cefdinir (OMNICEF) 300 MG capsule Take 1 capsule (300 mg total) by mouth 2 (two) times daily. 20 capsule Eustace Moore, MD   predniSONE (DELTASONE) 20 MG tablet Take 1 tablet (20 mg total) by mouth 2 (two) times daily with a meal. 10 tablet Eustace Moore, MD      I have reviewed the PDMP during this encounter.  Eustace Moore, MD 11/05/20 1023

## 2020-11-05 NOTE — Discharge Instructions (Signed)
Continue to drink lots of fluids Take antibiotic 2 times a day, take 2 doses today Use prednisone 2 times a day for 5 days Continue with your Flonase Take Tylenol or ibuprofen for moderate pain, may take your hydrocodone for severe pain Call your doctor if not improving by next week

## 2020-11-06 ENCOUNTER — Telehealth: Payer: Self-pay | Admitting: Emergency Medicine

## 2020-11-06 MED ORDER — AMOXICILLIN 875 MG PO TABS: 875.0000 mg | ORAL_TABLET | Freq: Two times a day (BID) | ORAL | 0 refills | Status: AC

## 2020-11-06 NOTE — Telephone Encounter (Signed)
Patient desires a different antibiotic.  States that the cefdinir is "too strong".  We will give her amoxicillin

## 2020-11-06 NOTE — Telephone Encounter (Signed)
Call back to pt to let her know that different antibiotic had been called to pharmacy on record. Pt requests script be transferred or sent to a different pharmacy due to [facility]. Dr Delton See updated. Voicemail left at [facility] ([facility] [address]for new script. [facility] [address] also called by RN to cancel e-script sent in by Dr Delton See.

## 2020-11-06 NOTE — Telephone Encounter (Signed)
Voice mail from Grace stating she can not take the cefdinir. It "hurts her stomach & is causing ringing in her ears" Pt was seen by Dr Delton See yesterday - asking for a different antibiotic. Note forwarded to Dr Delton See

## 2021-01-08 ENCOUNTER — Other Ambulatory Visit: Payer: Self-pay | Admitting: Sports Medicine

## 2021-01-08 DIAGNOSIS — M5416 Radiculopathy, lumbar region: Secondary | ICD-10-CM

## 2021-09-28 ENCOUNTER — Ambulatory Visit (INDEPENDENT_AMBULATORY_CARE_PROVIDER_SITE_OTHER): Payer: Federal, State, Local not specified - PPO | Admitting: Podiatry

## 2021-09-28 ENCOUNTER — Ambulatory Visit: Payer: Federal, State, Local not specified - PPO | Admitting: Podiatry

## 2021-09-28 ENCOUNTER — Encounter: Payer: Self-pay | Admitting: Podiatry

## 2021-09-28 ENCOUNTER — Ambulatory Visit (INDEPENDENT_AMBULATORY_CARE_PROVIDER_SITE_OTHER): Payer: Federal, State, Local not specified - PPO

## 2021-09-28 DIAGNOSIS — M722 Plantar fascial fibromatosis: Secondary | ICD-10-CM | POA: Diagnosis not present

## 2021-09-28 MED ORDER — DEXAMETHASONE SODIUM PHOSPHATE 120 MG/30ML IJ SOLN
4.0000 mg | Freq: Once | INTRAMUSCULAR | Status: AC
  Administered 2021-09-28: 4 mg via INTRA_ARTICULAR

## 2021-09-28 MED ORDER — MELOXICAM 15 MG PO TABS: 15.0000 mg | ORAL_TABLET | Freq: Every day | ORAL | 0 refills | Status: DC

## 2021-09-28 NOTE — Progress Notes (Signed)
  Subjective:  Patient ID: Rhonda Oneal, female    DOB: [birth year],   MRN: [id number]  Chief Complaint  Patient presents with   Plantar Fasciitis    Right heel pain     56 y.o. female presents for concern of right heel pain that has been going on for 4 months. Relates constant pain when walking and worse when getting up from sitting. She has tried icing the area and using a heating pad. Does relates that she is taking hydrocodone for her hip pain which does not help. 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   Denies any other pedal complaints. Denies n/v/f/c.   Past Medical History:  Diagnosis Date   Allergy to mold    Arthritis    Asthma    DERMATITIS, ATOPIC 02/09/2010   Qualifier: Diagnosis of  By: Orson Aloe MD, Tinnie Gens     EDEMA LEG 03/22/2010   Qualifier: Diagnosis of  By: Orson Aloe MD, Tinnie Gens     Fibromyalgia    IBS (irritable bowel syndrome)    Morbid obesity (HCC)     Objective:  Physical Exam: Vascular: DP/PT pulses 2/4 bilateral. CFT <3 seconds. Normal hair growth on digits. No edema.  Skin. No lacerations or abrasions bilateral feet.  Musculoskeletal: MMT 5/5 bilateral lower extremities in DF, PF, Inversion and Eversion. Deceased ROM in DF of ankle joint.  Tender to plantar right heel mostly lateral calcaneal tubercle and more proximally along proximal heel. Some pain medially. Some pain along achilles tendon. No pain along PT or arch and no pain with calcaneal squeeze.  Neurological: Sensation intact to light touch.   Assessment:   1. Plantar fasciitis of right foot      Plan:  Patient was evaluated and treated and all questions answered. Reviewed notes from Dr. Karle Barr and Dr. Karie Schwalbe.  Discussed plantar fasciitis with patient.  X-rays reviewed and discussed with patient. No acute fractures or dislocations noted. Mild spurring noted at inferior calcaneus.  Discussed treatment options  including, ice, NSAIDS, supportive shoes, bracing, and stretching. Stretching exercises provided to be done on a daily basis.   Prescription for meloxicam provided and sent to pharmacy. Patient requesting injection today. Procedure note below.  PF brace and night splint dispensed.  Follow-up 6 weeks or sooner if any problems arise. In the meantime, encouraged to call the office with any questions, concerns, change in symptoms.   Procedure:  Discussed etiology, pathology, conservative vs. surgical therapies. At this time a plantar fascial injection was recommended.  The patient agreed and a sterile skin prep was applied.  An injection consisting of  dexamethasone and marcaine mixture was infiltrated at the point of maximal tenderness on the right Heel.  Bandaid applied. The patient tolerated this well and was given instructions for aftercare.    Louann Sjogren, DPM

## 2021-09-28 NOTE — Patient Instructions (Signed)

## 2021-10-02 ENCOUNTER — Ambulatory Visit: Payer: Federal, State, Local not specified - PPO | Admitting: Sports Medicine

## 2021-10-21 ENCOUNTER — Other Ambulatory Visit: Payer: Self-pay | Admitting: Podiatry

## 2021-11-09 ENCOUNTER — Encounter: Payer: Self-pay | Admitting: Podiatry

## 2021-11-09 ENCOUNTER — Ambulatory Visit (INDEPENDENT_AMBULATORY_CARE_PROVIDER_SITE_OTHER): Payer: Federal, State, Local not specified - PPO | Admitting: Podiatry

## 2021-11-09 DIAGNOSIS — M722 Plantar fascial fibromatosis: Secondary | ICD-10-CM | POA: Diagnosis not present

## 2021-11-09 MED ORDER — DEXAMETHASONE SODIUM PHOSPHATE 120 MG/30ML IJ SOLN
4.0000 mg | Freq: Once | INTRAMUSCULAR | Status: AC
  Administered 2021-11-09: 4 mg via INTRA_ARTICULAR

## 2021-11-09 MED ORDER — MELOXICAM 15 MG PO TABS: 15.0000 mg | ORAL_TABLET | Freq: Every day | ORAL | 0 refills | Status: DC

## 2021-11-09 NOTE — Progress Notes (Signed)
  Subjective:  Patient ID: Rhonda Oneal, female    DOB: 19-Nov-1965,   MRN: [id number]  Chief Complaint  Patient presents with   Plantar Fasciitis    Patient states foot is better there is still some pain and it is still constant pain     56 y.o. female presents for follow-up of right heel pain. Relates she is doing better. About 75% better. States she still gets some sharp pain. Relates injection did help. Streching and night splint have helped. Has been wearing brooks. Relates she tried an insert that was really helpful but has not purchased yet and will be purchasing soon. 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   Denies any other pedal complaints. Denies n/v/f/c.   Past Medical History:  Diagnosis Date   Allergy to mold    Arthritis    Asthma    DERMATITIS, ATOPIC 02/09/2010   Qualifier: Diagnosis of  By: Koleen Nimrod MD, Dellis Filbert     EDEMA LEG 03/22/2010   Qualifier: Diagnosis of  By: Koleen Nimrod MD, Dellis Filbert     Fibromyalgia    IBS (irritable bowel syndrome)    Morbid obesity (HCC)     Objective:  Physical Exam: Vascular: DP/PT pulses 2/4 bilateral. CFT <3 seconds. Normal hair growth on digits. No edema.  Skin. No lacerations or abrasions bilateral feet.  Musculoskeletal: MMT 5/5 bilateral lower extremities in DF, PF, Inversion and Eversion. Deceased ROM in DF of ankle joint.  Mildly tender to plantar right heel mostly lateral calcaneal tubercle and more proximally along proximal heel. Some pain medially. Some pain along achilles tendon. No pain along PT or arch and no pain with calcaneal squeeze.  Neurological: Sensation intact to light touch.   Assessment:   1. Plantar fasciitis of right foot      Plan:  Patient was evaluated and treated and all questions answered. Reviewed notes from Dr. Raelyn Ensign and Dr. Darene Lamer.  Discussed plantar fasciitis with patient.  X-rays reviewed and discussed with patient. No  acute fractures or dislocations noted. Mild spurring noted at inferior calcaneus.  Discussed treatment options including, ice, NSAIDS, supportive shoes, bracing, and stretching.  Continue stretching.  Refill for meloxicam.  Patient requesting injection today. Procedure note below.  Continue night splint and support.  Follow-up as needed.    Procedure:  Discussed etiology, pathology, conservative vs. surgical therapies. At this time a plantar fascial injection was recommended.  The patient agreed and a sterile skin prep was applied.  An injection consisting of  dexamethasone and marcaine mixture was infiltrated at the point of maximal tenderness on the right Heel.  Bandaid applied. The patient tolerated this well and was given instructions for aftercare.    Lorenda Peck, DPM

## 2021-12-16 ENCOUNTER — Ambulatory Visit
Admission: EM | Admit: 2021-12-16 | Discharge: 2021-12-16 | Disposition: A | Discharge status: Home / Self Care | Payer: Federal, State, Local not specified - PPO | Attending: Family Medicine | Admitting: Family Medicine

## 2021-12-16 ENCOUNTER — Encounter: Payer: Self-pay | Admitting: Emergency Medicine

## 2021-12-16 DIAGNOSIS — J4541 Moderate persistent asthma with (acute) exacerbation: Secondary | ICD-10-CM | POA: Diagnosis not present

## 2021-12-16 DIAGNOSIS — J324 Chronic pansinusitis: Secondary | ICD-10-CM

## 2021-12-16 MED ORDER — PREDNISONE 20 MG PO TABS: 20.0000 mg | ORAL_TABLET | Freq: Two times a day (BID) | ORAL | 0 refills | Status: DC

## 2021-12-16 NOTE — ED Provider Notes (Signed)
[name]    CSN: [id number] Arrival date & time: 12/16/21  1005      History   Chief Complaint Chief Complaint  Patient presents with   Cough    HPI Rhonda Oneal Rhonda Oneal is a 56 y.o. female.   HPI  Patient has a history of asthma.  Environmental allergies.  Chronic pansinusitis.  Recurring sinus infections.  States that she saw her primary care doctor and was given cefdinir.  She took 10 days of cefdinir called back with continued symptoms.  She was then given prednisone.  She felt better while on the prednisone but now feels sick again.  She states she has sinus congestion, tight feeling in the back of her throat, coughing and some chest pain with cough.  She has been using more of her albuterol.  She has continued to use her Pulmicort.  She is also on cetirizine, azelastine nasal spray. Past Medical History:  Diagnosis Date   Allergy to mold    Arthritis    Asthma    DERMATITIS, ATOPIC 02/09/2010   Qualifier: Diagnosis of  By: Orson Aloe MD, Tinnie Gens     EDEMA LEG 03/22/2010   Qualifier: Diagnosis of  By: Orson Aloe MD, Tinnie Gens     Fibromyalgia    IBS (irritable bowel syndrome)    Morbid obesity (HCC)     Patient Active Problem List   Diagnosis Date Noted   Allergic rhinitis 03/14/2020   Asthma 03/14/2020   Fibromyalgia 03/14/2020   Sciatic nerve pain 03/14/2020   Trigger thumb, left thumb 04/02/2019   Degenerative spondylolisthesis 12/07/2016   Mild intermittent asthma with exacerbation 11/05/2016   Pain of left sacroiliac joint 04/18/2016   Trochanteric bursitis of both hips 03/16/2016   History of asthma 01/17/2016   Ischial bursitis 12/15/2015   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   Sickle cell trait (HCC) 01/20/2013   Synovitis of foot 12/26/2012   Arthralgia 12/08/2012   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   Cervical spinal stenosis 11/12/2012   Brachial neuritis 11/12/2012   Left lumbar  radiculopathy 05/13/2012   Irritable bowel syndrome 11/29/2011   Morbid obesity (HCC)    ALLERGIC RHINITIS, SEASONAL 06/08/2010   EDEMA LEG 03/22/2010   DERMATITIS, ATOPIC 02/09/2010   Vitamin D deficiency 04/15/2008    Past Surgical History:  Procedure Laterality Date   CESAREAN SECTION     TONSILLECTOMY AND ADENOIDECTOMY     WISDOM TOOTH EXTRACTION      OB History     Gravida  2   Para  2   Term      Preterm      AB      Living         SAB      IAB      Ectopic      Multiple      Live Births               Home Medications    Prior to Admission medications   Medication Sig Start Date End Date Taking? Authorizing Provider  baclofen (LIORESAL) 10 MG tablet TAKE 1 TABLET BY MOUTH EVERY DAY 12/04/18  Yes [provider]  diclofenac Sodium (VOLTAREN) 1 % GEL APPLY TO AFFECTED AREA 3 TIMES A DAY 12/14/19  Yes [provider]  furosemide (LASIX) 20 MG tablet Take 1 tablet by mouth daily. 01/01/20  Yes [provider]  ibuprofen (ADVIL) 800 MG tablet  01/07/19  Yes [provider]  lidocaine (LIDODERM) 5 % PLACE 1 PATCH ON THE SKIN DAILY FOR 30 DAY REMOVE & DISCARD PATCH WITHIN 12 HRS OR AS DIRECTED BY MD 02/15/20  Yes [provider]  meloxicam (MOBIC) 15 MG tablet Take 1 tablet (15 mg total) by mouth daily. 11/09/21  Yes Louann Sjogren, DPM  metFORMIN (GLUCOPHAGE-XR) 500 MG 24 hr tablet Take by mouth. 07/12/20  Yes [provider]  omeprazole (PRILOSEC) 40 MG capsule Take by mouth. 05/01/20  Yes [provider]  pravastatin (PRAVACHOL) 20 MG tablet Take 20 mg by mouth at bedtime. 03/19/19  Yes [provider]  predniSONE (DELTASONE) 20 MG tablet Take 1 tablet (20 mg total) by mouth 2 (two) times daily with a meal. 12/16/21  Yes Eustace Moore, MD  tiZANidine (ZANAFLEX) 4 MG capsule Take 1 capsule (4 mg total) by mouth 3 (three) times daily. 08/10/20  Yes Raspet, Denny Peon K, PA-C   levocetirizine (XYZAL) 5 MG tablet Take by mouth. 11/23/19 11/22/20  [provider]  losartan-hydrochlorothiazide [name]) 50-12.5 MG tablet Take by mouth. 12/03/19 12/02/20  [provider]  pantoprazole (PROTONIX) 40 MG tablet Take by mouth. 11/23/19 11/22/20  [provider]    Family History Family History  Problem Relation Age of Onset   Diabetes Mother    Cancer Mother        Lung   Hyperlipidemia Father    Breast cancer Maternal Aunt    Ovarian cancer Paternal Aunt     Social History Social History   Tobacco Use   Smoking status: Never   Smokeless tobacco: Never  Vaping Use   Vaping Use: Never used  Substance Use Topics   Alcohol use: No   Drug use: No     Allergies   Cymbalta [duloxetine hcl], Doxycycline, and Ferrous sulfate   Review of Systems Review of Systems See HPI  Physical Exam Triage Vital Signs ED Triage Vitals  Enc Vitals Group     BP 12/16/21 1021 138/88     Pulse Rate 12/16/21 1021 76     Resp 12/16/21 1021 18     Temp 12/16/21 1021 98.8 F (37.1 C)     Temp Source 12/16/21 1021 Oral     SpO2 12/16/21 1021 100 %     Weight 12/16/21 1022 205 lb (93 kg)     Height 12/16/21 1022 5\' 1"  (1.549 m)     Head Circumference --      Peak Flow --      Pain Score 12/16/21 1022 8     Pain Loc --      Pain Edu? --      Excl. in GC? --    No data found.  Updated Vital Signs BP 138/88 (BP Location: Left Arm)   Pulse 76   Temp 98.8 F (37.1 C) (Oral)   Resp 18   Ht 5\' 1"  (1.549 m)   Wt 93 kg   SpO2 100%   BMI 38.73 kg/m       Physical Exam Constitutional:      General: She is not in acute distress.    Appearance: She is well-developed. She is ill-appearing.  HENT:     Head: Normocephalic and atraumatic.     Right Ear: Tympanic membrane and ear canal normal.     Left Ear: Tympanic membrane and ear canal normal.     Nose: Congestion and rhinorrhea present.     Mouth/Throat:     Pharynx:  Posterior  oropharyngeal erythema present.     Comments: Nasal membranes are swollen and erythematous.  Posterior pharynx has some postnasal drip.  Tonsils are surgically absent Eyes:     Conjunctiva/sclera: Conjunctivae normal.     Pupils: Pupils are equal, round, and reactive to light.  Cardiovascular:     Rate and Rhythm: Normal rate and regular rhythm.     Heart sounds: Normal heart sounds.  Pulmonary:     Effort: Pulmonary effort is normal. No respiratory distress.     Breath sounds: Normal breath sounds.  Abdominal:     General: There is no distension.     Palpations: Abdomen is soft.  Musculoskeletal:        General: Normal range of motion.     Cervical back: Normal range of motion.  Lymphadenopathy:     Cervical: No cervical adenopathy.  Skin:    General: Skin is warm and dry.  Neurological:     Mental Status: She is alert.      UC Treatments / Results  Labs (all labs ordered are listed, but only abnormal results are displayed) Labs Reviewed - No data to display  EKG   Radiology No results found.  Procedures Procedures (including critical care time)  Medications Ordered in UC Medications - No data to display  Initial Impression / Assessment and Plan / UC Course  I have reviewed the triage vital signs and the nursing notes.  Pertinent labs & imaging results that were available during my care of the patient were reviewed by me and considered in my medical decision making (see chart for details).     Final Clinical Impressions(s) / UC Diagnoses   Final diagnoses:  Moderate persistent asthma with exacerbation  Chronic pansinusitis     Discharge Instructions      Continue your usual asthma and allergy medicines At prednisone 2 times a day for 5 days Call your doctor if you do not see improvement by next week   ED Prescriptions     Medication Sig Dispense Auth. Provider   predniSONE (DELTASONE) 20 MG tablet Take 1 tablet (20 mg total) by mouth 2 (two)  times daily with a meal. 10 tablet Rhonda Everts, MD      PDMP not reviewed this encounter.   Rhonda Everts, MD 12/16/21 1139

## 2021-12-16 NOTE — ED Triage Notes (Signed)
Patient c/o non-productive cough, head and chest congestion, voice is now hoarse x 2 weeks.  Bilateral ear pain.  Patient has taken Mucinex, Tylenol and Ibuprofen.

## 2021-12-16 NOTE — Discharge Instructions (Signed)
Continue your usual asthma and allergy medicines At prednisone 2 times a day for 5 days Call your doctor if you do not see improvement by next week

## 2022-02-15 ENCOUNTER — Ambulatory Visit
Admission: EM | Admit: 2022-02-15 | Discharge: 2022-02-15 | Disposition: A | Discharge status: Home / Self Care | Payer: Federal, State, Local not specified - PPO | Attending: Family Medicine | Admitting: Family Medicine

## 2022-02-15 ENCOUNTER — Ambulatory Visit (INDEPENDENT_AMBULATORY_CARE_PROVIDER_SITE_OTHER): Payer: Federal, State, Local not specified - PPO

## 2022-02-15 DIAGNOSIS — M542 Cervicalgia: Secondary | ICD-10-CM | POA: Diagnosis not present

## 2022-02-15 DIAGNOSIS — M25522 Pain in left elbow: Secondary | ICD-10-CM

## 2022-02-15 DIAGNOSIS — S8000XA Contusion of unspecified knee, initial encounter: Secondary | ICD-10-CM

## 2022-02-15 DIAGNOSIS — W19XXXA Unspecified fall, initial encounter: Secondary | ICD-10-CM

## 2022-02-15 DIAGNOSIS — J029 Acute pharyngitis, unspecified: Secondary | ICD-10-CM

## 2022-02-15 DIAGNOSIS — Y92009 Unspecified place in unspecified non-institutional (private) residence as the place of occurrence of the external cause: Secondary | ICD-10-CM

## 2022-02-15 MED ORDER — PREDNISONE 20 MG PO TABS: 40.0000 mg | ORAL_TABLET | Freq: Every day | ORAL | 0 refills | Status: DC

## 2022-02-15 MED ORDER — CYCLOBENZAPRINE HCL 10 MG PO TABS: 10.0000 mg | ORAL_TABLET | Freq: Two times a day (BID) | ORAL | 0 refills | Status: DC | PRN

## 2022-02-15 NOTE — ED Triage Notes (Signed)
Pt c/o LT hand and knee pain since fall last night when she tripped over a cord in her home. Also states having some pain in both shoulders and upper lip due to teeth puncturing it. Takes hydrocodone for fibromyalgia and also motrin prn. Ice prn.

## 2022-02-15 NOTE — ED Provider Notes (Signed)
[name]    CSN: [id number] Arrival date & time: 02/15/22  0820      History   Chief Complaint Chief Complaint  Patient presents with   Fall   Hand Pain    LT   Knee Pain    LT    HPI Rhonda Oneal is a 56 y.o. female.   HPI  This is a complicated pain patient with fibromyalgia and significant arthritis who is on regular hydrocodone.  She tripped over her vacuum cord and fell and last night, landing on both hands and knees.  She cut her upper lip on her teeth.  She has pain in her neck.  She did not hit her head. Today she complains of bruising on both knees.  She can fully weight-bear.  She has pain in her left elbow, pain with movement, numbness down into her fourth and fifth fingers on the left.  She has pain in both shoulders and points to her upper trapezius region.  She has some pain in her lip and teeth but no looseness of the teeth.   Past Medical History:  Diagnosis Date   Allergy to mold    Arthritis    Asthma    DERMATITIS, ATOPIC 02/09/2010   Qualifier: Diagnosis of  By: Orson Aloe MD, Tinnie Gens     EDEMA LEG 03/22/2010   Qualifier: Diagnosis of  By: Orson Aloe MD, Tinnie Gens     Fibromyalgia    IBS (irritable bowel syndrome)    Morbid obesity (HCC)     Patient Active Problem List   Diagnosis Date Noted   Allergic rhinitis 03/14/2020   Fibromyalgia 03/14/2020   Sciatic nerve pain 03/14/2020   Trigger thumb, left thumb 04/02/2019   Degenerative spondylolisthesis 12/07/2016   Mild intermittent asthma with exacerbation 11/05/2016   Pain of left sacroiliac joint 04/18/2016   Trochanteric bursitis of both hips 03/16/2016   Ischial bursitis 12/15/2015   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   Sickle cell trait (HCC) 01/20/2013   Synovitis of foot 12/26/2012   Arthralgia 12/08/2012   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   Cervical spinal stenosis 11/12/2012   Brachial neuritis 11/12/2012   Left  lumbar radiculopathy 05/13/2012   Irritable bowel syndrome 11/29/2011   Morbid obesity (HCC)    ALLERGIC RHINITIS, SEASONAL 06/08/2010   EDEMA LEG 03/22/2010   DERMATITIS, ATOPIC 02/09/2010   Vitamin D deficiency 04/15/2008    Past Surgical History:  Procedure Laterality Date   CESAREAN SECTION     TONSILLECTOMY AND ADENOIDECTOMY     WISDOM TOOTH EXTRACTION      OB History     Gravida  2   Para  2   Term      Preterm      AB      Living         SAB      IAB      Ectopic      Multiple      Live Births               Home Medications    Prior to Admission medications   Medication Sig Start Date End Date Taking? Authorizing Provider  cyclobenzaprine (FLEXERIL) 10 MG tablet Take 1 tablet (10 mg total) by mouth 2 (two) times daily as needed for muscle spasms. 02/15/22  Yes Eustace Moore, MD  predniSONE (DELTASONE) 20 MG tablet Take 2 tablets (40 mg total) by mouth daily  with breakfast. 02/15/22  Yes Eustace Moore, MD  diclofenac Sodium (VOLTAREN) 1 % GEL APPLY TO AFFECTED AREA 3 TIMES A DAY 12/14/19   [provider]  furosemide (LASIX) 20 MG tablet Take 1 tablet by mouth daily. 01/01/20   [provider]  ibuprofen (ADVIL) 800 MG tablet  01/07/19   [provider]  lidocaine (LIDODERM) 5 % PLACE 1 PATCH ON THE SKIN DAILY FOR 30 DAY REMOVE & DISCARD PATCH WITHIN 12 HRS OR AS DIRECTED BY MD 02/15/20   [provider]  losartan-hydrochlorothiazide (HYZAAR) 50-12.5 MG tablet Take by mouth. 12/03/19 12/02/20  [provider]  metFORMIN (GLUCOPHAGE-XR) 500 MG 24 hr tablet Take by mouth. 07/12/20   [provider]  pantoprazole (PROTONIX) 40 MG tablet Take by mouth. 11/23/19 11/22/20  [provider]  pravastatin (PRAVACHOL) 20 MG tablet Take 20 mg by mouth at bedtime. 03/19/19   [provider]    Family History Family History  Problem Relation Age of Onset   Diabetes Mother     Cancer Mother        Lung   Hyperlipidemia Father    Breast cancer Maternal Aunt    Ovarian cancer Paternal Aunt     Social History Social History   Tobacco Use   Smoking status: Never   Smokeless tobacco: Never  Vaping Use   Vaping Use: Never used  Substance Use Topics   Alcohol use: No   Drug use: No     Allergies   Cymbalta [duloxetine hcl], Doxycycline, and Ferrous sulfate   Review of Systems Review of Systems See HPI  Physical Exam Triage Vital Signs ED Triage Vitals  Enc Vitals Group     BP 02/15/22 0830 126/84     Pulse Rate 02/15/22 0830 88     Resp 02/15/22 0830 17     Temp 02/15/22 0830 98.6 F (37 C)     Temp Source 02/15/22 0830 Oral     SpO2 02/15/22 0830 100 %     Weight --      Height --      Head Circumference --      Peak Flow --      Pain Score 02/15/22 0828 8     Pain Loc --      Pain Edu? --      Excl. in GC? --    No data found.  Updated Vital Signs BP 126/84 (BP Location: Right Arm)   Pulse 88   Temp 98.6 F (37 C) (Oral)   Resp 17   SpO2 100%      Physical Exam Constitutional:      General: She is in acute distress.     Appearance: She is well-developed. She is obese.     Comments: Guarded movements.  Appears uncomfortable  HENT:     Head: Normocephalic and atraumatic.     Mouth/Throat:     Mouth: Mucous membranes are moist.     Pharynx: Posterior oropharyngeal erythema present.     Comments: Patient complains also of a sore throat.  Has some erythema in the posterior pharynx and postnasal drip.  No exudate.  No adenopathy.  No suspicion of bacterial infection.  Likely allergies Eyes:     Conjunctiva/sclera: Conjunctivae normal.     Pupils: Pupils are equal, round, and reactive to light.  Neck:     Comments: Tenderness in the right greater than left trapezius muscles and paraspinous cervical muscles.  Slow but full  range of motion.  No bony tenderness. Cardiovascular:     Rate and Rhythm: Normal rate.  Pulmonary:      Effort: Pulmonary effort is normal. No respiratory distress.  Abdominal:     General: There is no distension.     Palpations: Abdomen is soft.  Musculoskeletal:        General: Signs of injury present. No swelling. Normal range of motion.     Cervical back: Normal range of motion. Tenderness present.     Comments: Patient has a slow gait, small steps.  Not antalgic.  She has slight bruising on both anterior knees.  She has tenderness all about the left elbow medial and lateral, olecranon process.  Very tender in the ulnar groove.  Palpation in this area is positive for tingling in the fifth finger.  Skin:    General: Skin is warm and dry.  Neurological:     Mental Status: She is alert.     Gait: Gait abnormal.  Psychiatric:        Mood and Affect: Mood normal.      UC Treatments / Results  Labs (all labs ordered are listed, but only abnormal results are displayed) Labs Reviewed - No data to display  EKG   Radiology DG Elbow Complete Left  Result Date: 02/15/2022 CLINICAL DATA:  Status post fall, elbow pain EXAM: LEFT ELBOW - COMPLETE 3+ VIEW COMPARISON:  None Available. FINDINGS: No acute fracture or dislocation. No aggressive osseous lesion. Normal alignment. Mild osteoarthritis of the ulnohumeral joint. No joint effusion. Soft tissue are unremarkable. No radiopaque foreign body or soft tissue emphysema. IMPRESSION: 1. No acute osseous injury of the left elbow. Electronically Signed   By: Elige Ko M.D.   On: 02/15/2022 08:57    Procedures Procedures (including critical care time)  Medications Ordered in UC Medications - No data to display  Initial Impression / Assessment and Plan / UC Course  I have reviewed the triage vital signs and the nursing notes.  Pertinent labs & imaging results that were available during my care of the patient were reviewed by me and considered in my medical decision making (see chart for details).    Service for management of  musculoskeletal injuries discussed. Final Clinical Impressions(s) / UC Diagnoses   Final diagnoses:  Pain in left elbow  Contusion of knee, unspecified laterality, initial encounter  Musculoskeletal neck pain  Fall at home, initial encounter  Sore throat     Discharge Instructions      For your muscle pain; take prednisone daily for the next 5 days.  After that she will go back to your ibuprofen Take Flexeril 2-3 times a day.  This is a muscle relaxer.  It may cause drowsiness If pain is severe you have hydrocodone  reduce activity when muscles are painful Use ice or heat for pain relief  For the postnasal drip continue your Zyrtec.  Go back on your Flonase. See your primary care if you fail to improve    ED Prescriptions     Medication Sig Dispense Auth. Provider   predniSONE (DELTASONE) 20 MG tablet Take 2 tablets (40 mg total) by mouth daily with breakfast. 10 tablet Eustace Moore, MD   cyclobenzaprine (FLEXERIL) 10 MG tablet Take 1 tablet (10 mg total) by mouth 2 (two) times daily as needed for muscle spasms. 20 tablet Eustace Moore, MD      PDMP not reviewed this encounter.   Eustace Moore, MD 02/15/22  0947  

## 2022-02-15 NOTE — Discharge Instructions (Signed)
For your muscle pain; take prednisone daily for the next 5 days.  After that she will go back to your ibuprofen Take Flexeril 2-3 times a day.  This is a muscle relaxer.  It may cause drowsiness If pain is severe you have hydrocodone  reduce activity when muscles are painful Use ice or heat for pain relief  For the postnasal drip continue your Zyrtec.  Go back on your Flonase. See your primary care if you fail to improve

## 2022-02-21 ENCOUNTER — Ambulatory Visit (INDEPENDENT_AMBULATORY_CARE_PROVIDER_SITE_OTHER): Payer: Federal, State, Local not specified - PPO

## 2022-02-21 ENCOUNTER — Ambulatory Visit
Admission: EM | Admit: 2022-02-21 | Discharge: 2022-02-21 | Disposition: A | Discharge status: Home / Self Care | Payer: Federal, State, Local not specified - PPO | Attending: Family Medicine | Admitting: Family Medicine

## 2022-02-21 DIAGNOSIS — M25512 Pain in left shoulder: Secondary | ICD-10-CM | POA: Diagnosis not present

## 2022-02-21 DIAGNOSIS — W19XXXD Unspecified fall, subsequent encounter: Secondary | ICD-10-CM | POA: Diagnosis not present

## 2022-02-21 DIAGNOSIS — M25562 Pain in left knee: Secondary | ICD-10-CM

## 2022-02-21 NOTE — ED Triage Notes (Signed)
Pt presents to Urgent Care with c/o L shoulder and knee pain following fall three days ago. Has been seen here after this incident and had x-ray to L elbow which was negative.

## 2022-02-21 NOTE — ED Provider Notes (Signed)
[name]    CSN: [id number] Arrival date & time: 02/21/22  1152      History   Chief Complaint Chief Complaint  Patient presents with   Fall    HPI Rhonda Oneal is a 57 y.o. female.   HPI 57 year old female presents with left shoulder and left knee pain secondary to fall at home 3 days ago (02/16/27).  PMH significant for morbid obesity, IBS, and fibromyalgia  Past Medical History:  Diagnosis Date   Allergy to mold    Arthritis    Asthma    DERMATITIS, ATOPIC 02/09/2010   Qualifier: Diagnosis of  By: Koleen Nimrod MD, Dellis Filbert     EDEMA LEG 03/22/2010   Qualifier: Diagnosis of  By: Koleen Nimrod MD, Dellis Filbert     Fibromyalgia    IBS (irritable bowel syndrome)    Morbid obesity ([place])     Patient Active Problem List   Diagnosis Date Noted   Allergic rhinitis 03/14/2020   Fibromyalgia 03/14/2020   Sciatic nerve pain 03/14/2020   Trigger thumb, left thumb 04/02/2019   Degenerative spondylolisthesis 12/07/2016   Mild intermittent asthma with exacerbation 11/05/2016   Pain of left sacroiliac joint 04/18/2016   Trochanteric bursitis of both hips 03/16/2016   Ischial bursitis 12/15/2015   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   Sickle cell trait ([place]) 01/20/2013   Synovitis of foot 12/26/2012   Arthralgia 12/08/2012   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   Cervical spinal stenosis 11/12/2012   Brachial neuritis 11/12/2012   Left lumbar radiculopathy 05/13/2012   Irritable bowel syndrome 11/29/2011   Morbid obesity ([place])    ALLERGIC RHINITIS, SEASONAL 06/08/2010   EDEMA LEG 03/22/2010   DERMATITIS, ATOPIC 02/09/2010   Vitamin D deficiency 04/15/2008    Past Surgical History:  Procedure Laterality Date   CESAREAN SECTION     TONSILLECTOMY AND ADENOIDECTOMY     WISDOM TOOTH EXTRACTION      OB History     Gravida  2   Para  2   Term      Preterm      AB      Living         SAB      IAB       Ectopic      Multiple      Live Births               Home Medications    Prior to Admission medications   Medication Sig Start Date End Date Taking? Authorizing Provider  cyclobenzaprine (FLEXERIL) 10 MG tablet Take 1 tablet (10 mg total) by mouth 2 (two) times daily as needed for muscle spasms. 02/15/22   Raylene Everts, MD  diclofenac Sodium (VOLTAREN) 1 % GEL APPLY TO AFFECTED AREA 3 TIMES A DAY 12/14/19   [provider]  furosemide (LASIX) 20 MG tablet Take 1 tablet by mouth daily. 01/01/20   [provider]  ibuprofen (ADVIL) 800 MG tablet  01/07/19   [provider]  lidocaine (LIDODERM) 5 % PLACE 1 PATCH ON THE SKIN DAILY FOR 30 DAY REMOVE & DISCARD PATCH WITHIN 12 HRS OR AS DIRECTED BY MD 02/15/20   [provider]  losartan-hydrochlorothiazide (HYZAAR) 50-12.5 MG tablet Take by mouth. 12/03/19 12/02/20  [provider]  metFORMIN (GLUCOPHAGE-XR) 500 MG 24 hr tablet Take by mouth. 07/12/20   [provider]  pantoprazole (PROTONIX) 40 MG tablet Take by mouth. 11/23/19  11/22/20  [provider]  pravastatin (PRAVACHOL) 20 MG tablet Take 20 mg by mouth at bedtime. 03/19/19   [provider]  predniSONE (DELTASONE) 20 MG tablet Take 2 tablets (40 mg total) by mouth daily with breakfast. 02/15/22   Eustace Moore, MD    Family History Family History  Problem Relation Age of Onset   Diabetes Mother    Cancer Mother        Lung   Hyperlipidemia Father    Breast cancer Maternal Aunt    Ovarian cancer Paternal Aunt     Social History Social History   Tobacco Use   Smoking status: Never   Smokeless tobacco: Never  Vaping Use   Vaping Use: Never used  Substance Use Topics   Alcohol use: No   Drug use: No     Allergies   Cymbalta [duloxetine hcl], Doxycycline, and Ferrous sulfate   Review of Systems Review of Systems  Musculoskeletal:        Left knee pain/left shoulder pain secondary  to fall that occurred on 02/15/2022.  All other systems reviewed and are negative.    Physical Exam Triage Vital Signs ED Triage Vitals  Enc Vitals Group     BP 02/21/22 1248 97/62     Pulse Rate 02/21/22 1248 (!) 106     Resp 02/21/22 1248 20     Temp 02/21/22 1248 98.3 F (36.8 C)     Temp Source 02/21/22 1248 Oral     SpO2 02/21/22 1248 100 %     Weight 02/21/22 1244 203 lb (92.1 kg)     Height 02/21/22 1244 5\' 1"  (1.549 m)     Head Circumference --      Peak Flow --      Pain Score 02/21/22 1244 7     Pain Loc --      Pain Edu? --      Excl. in GC? --    No data found.  Updated Vital Signs BP 97/62 (BP Location: Right Arm)   Pulse (!) 106   Temp 98.3 F (36.8 C) (Oral)   Resp 20   Ht 5\' 1"  (1.549 m)   Wt 203 lb (92.1 kg)   SpO2 100%   BMI 38.36 kg/m   Visual Acuity Right Eye Distance:   Left Eye Distance:   Bilateral Distance:    Right Eye Near:   Left Eye Near:    Bilateral Near:     Physical Exam Vitals and nursing note reviewed.  Constitutional:      General: She is not in acute distress.    Appearance: Normal appearance. She is obese. She is not ill-appearing.  HENT:     Head: Normocephalic and atraumatic.     Mouth/Throat:     Mouth: Mucous membranes are moist.     Pharynx: Oropharynx is clear.  Eyes:     Extraocular Movements: Extraocular movements intact.     Conjunctiva/sclera: Conjunctivae normal.     Pupils: Pupils are equal, round, and reactive to light.  Cardiovascular:     Rate and Rhythm: Normal rate and regular rhythm.     Pulses: Normal pulses.     Heart sounds: Normal heart sounds.  Pulmonary:     Effort: Pulmonary effort is normal.     Breath sounds: Normal breath sounds. No wheezing, rhonchi or rales.  Musculoskeletal:        General: Normal range of motion.     Cervical back: Normal range  of motion and neck supple.  Skin:    General: Skin is warm and dry.  Neurological:     General: No focal deficit present.      Mental Status: She is alert and oriented to person, place, and time. Mental status is at baseline.      UC Treatments / Results  Labs (all labs ordered are listed, but only abnormal results are displayed) Labs Reviewed - No data to display  EKG   Radiology DG Shoulder Left  Result Date: 02/21/2022 CLINICAL DATA:  Left shoulder pain after fall 3 days ago. EXAM: LEFT SHOULDER - 2+ VIEW COMPARISON:  None Available. FINDINGS: There is no evidence of fracture or dislocation. There is no evidence of arthropathy or other focal bone abnormality. Soft tissues are unremarkable. IMPRESSION: Negative. Electronically Signed   By: Marijo Conception M.D.   On: 02/21/2022 13:52   DG Knee Complete 4 Views Left  Result Date: 02/21/2022 CLINICAL DATA:  Left knee pain after fall 3 days ago. EXAM: LEFT KNEE - COMPLETE 4+ VIEW COMPARISON:  None Available. FINDINGS: No evidence of fracture, dislocation, or joint effusion. No evidence of arthropathy or other focal bone abnormality. Soft tissues are unremarkable. IMPRESSION: Negative. Electronically Signed   By: Marijo Conception M.D.   On: 02/21/2022 13:50    Procedures Procedures (including critical care time)  Medications Ordered in UC Medications - No data to display  Initial Impression / Assessment and Plan / UC Course  I have reviewed the triage vital signs and the nursing notes.  Pertinent labs & imaging results that were available during my care of the patient were reviewed by me and considered in my medical decision making (see chart for details).     MDM: 1.  Fall, subsequent encounter-fall at home patiently initially seen here on 02/15/2022 and evaluated for left elbow pain; 2.  Left shoulder pain-left shoulder x-ray revealed above; 3.  Left knee pain-left knee x-ray revealed above. Advised patient of left shoulder/left knee x-ray results with hardcopy provided to patient.  Advised patient may RICE affected areas of left shoulder and left knee for 25  to 30 minutes 3 times daily for the next 3 days.  Advised patient if pain of these areas continue please follow-up with PCP or [facility] orthopedic provider for further evaluation.  Contact information is below.  Patient discharged home, hemodynamically stable. Final Clinical Impressions(s) / UC Diagnoses   Final diagnoses:  Fall, subsequent encounter  Left knee pain, unspecified chronicity  Left shoulder pain, unspecified chronicity     Discharge Instructions      Advised patient of left shoulder/left knee x-ray results with hardcopy provided to patient.  Advised patient may RICE affected areas of left shoulder and left knee for 25 to 30 minutes 3 times daily for the next 3 days.  Advised patient if pain of these areas continue please follow-up with PCP or [place] orthopedic provider for further evaluation.  Contact information is below.     ED Prescriptions   None    PDMP not reviewed this encounter.   Eliezer Lofts, FNP 02/21/22 1423

## 2022-02-21 NOTE — Discharge Instructions (Addendum)
Advised patient of left shoulder/left knee x-ray results with hardcopy provided to patient.  Advised patient may RICE affected areas of left shoulder and left knee for 25 to 30 minutes 3 times daily for the next 3 days.  Advised patient if pain of these areas continue please follow-up with PCP or [place] orthopedic provider for further evaluation.  Contact information is below.

## 2022-02-22 ENCOUNTER — Ambulatory Visit
Admission: EM | Admit: 2022-02-22 | Discharge: 2022-02-22 | Disposition: A | Discharge status: Home / Self Care | Payer: Federal, State, Local not specified - PPO

## 2022-03-22 ENCOUNTER — Ambulatory Visit (INDEPENDENT_AMBULATORY_CARE_PROVIDER_SITE_OTHER): Payer: Federal, State, Local not specified - PPO | Admitting: Sports Medicine

## 2022-03-22 ENCOUNTER — Ambulatory Visit (INDEPENDENT_AMBULATORY_CARE_PROVIDER_SITE_OTHER): Payer: Federal, State, Local not specified - PPO

## 2022-03-22 DIAGNOSIS — M19012 Primary osteoarthritis, left shoulder: Secondary | ICD-10-CM

## 2022-03-22 MED ORDER — TRIAMCINOLONE ACETONIDE 40 MG/ML IJ SUSP
40.0000 mg | Freq: Once | INTRAMUSCULAR | Status: AC
  Administered 2022-03-22: 40 mg via INTRAMUSCULAR

## 2022-03-22 NOTE — Assessment & Plan Note (Signed)
Pleasant 57 year old female, she had a fall back in early January, she had pain all over, still has some pain periscapular on the left as well as over the left deltoid. She was seen in urgent care, x-rays did not show any fractures but she did have some severe shoulder osteoarthritis. She also has been seen in her pain clinic where she gets narcotics, gabapentin. Today the dominant pain generator is her shoulder. She has impingement and glenohumeral signs. She is fairly weak throughout, however I do not note any focal neurologic deficits and she has a negative Hoffmann sign bilaterally. She was hoping for an injection so we will give her a glenohumeral joint injection today, I did advise that prolonged achiness after a fall would necessitate aggressive physical therapy. We will also pull the trigger for this, return to see me in 6 weeks.

## 2022-03-22 NOTE — Progress Notes (Signed)
    Procedures performed today:    Procedure: Real-time Ultrasound Guided injection of the left glenohumeral joint Device: Samsung HS60  Verbal informed consent obtained.  Time-out conducted.  Noted no overlying erythema, induration, or other signs of local infection.  Skin prepped in a sterile fashion.  Local anesthesia: Topical Ethyl chloride.  With sterile technique and under real time ultrasound guidance: Arthritic joint noted, 1 cc Kenalog 40, 2 cc lidocaine, 2 cc bupivacaine injected easily Completed without difficulty  Advised to call if fevers/chills, erythema, induration, drainage, or persistent bleeding.  Images permanently stored and available for review in PACS.  Impression: Technically successful ultrasound guided injection.  Independent interpretation of notes and tests performed by another provider:   None.  Brief History, Exam, Impression, and Recommendations:    Primary osteoarthritis of left shoulder Pleasant 57 year old female, she had a fall back in early January, she had pain all over, still has some pain periscapular on the left as well as over the left deltoid. She was seen in urgent care, x-rays did not show any fractures but she did have some severe shoulder osteoarthritis. She also has been seen in her pain clinic where she gets narcotics, gabapentin. Today the dominant pain generator is her shoulder. She has impingement and glenohumeral signs. She is fairly weak throughout, however I do not note any focal neurologic deficits and she has a negative Hoffmann sign bilaterally. She was hoping for an injection so we will give her a glenohumeral joint injection today, I did advise that prolonged achiness after a fall would necessitate aggressive physical therapy. We will also pull the trigger for this, return to see me in 6 weeks.    ____________________________________________ Gwen Her. Dianah Field, M.D., ABFM., CAQSM., AME. Primary Care and Sports  Medicine [facility]  Adjunct Professor of [place] [employer] [facility] [employer]  Risk manager

## 2022-03-26 NOTE — Therapy (Signed)
OUTPATIENT PHYSICAL THERAPY SHOULDER EVALUATION   Patient Name: Rhonda Oneal MRN: [id number] DOB:09-14-1965, 57 y.o., female Today's Date: 03/27/2022  END OF SESSION:  PT End of Session - 03/27/22 1102     Visit Number 1    Number of Visits 8    Date for PT Re-Evaluation 05/08/22    Authorization Type [employer]    PT Start Time 1102    PT Stop Time 1145    PT Time Calculation (min) 43 min    Activity Tolerance Patient tolerated treatment well    Behavior During Therapy WFL for tasks assessed/performed             Past Medical History:  Diagnosis Date   Allergy to mold    Arthritis    Asthma    DERMATITIS, ATOPIC 02/09/2010   Qualifier: Diagnosis of  By: Orson Aloe MD, Tinnie Gens     EDEMA LEG 03/22/2010   Qualifier: Diagnosis of  By: Orson Aloe MD, Tinnie Gens     Fibromyalgia    IBS (irritable bowel syndrome)    Morbid obesity (HCC)    Past Surgical History:  Procedure Laterality Date   CESAREAN SECTION     TONSILLECTOMY AND ADENOIDECTOMY     WISDOM TOOTH EXTRACTION     Patient Active Problem List   Diagnosis Date Noted   Primary osteoarthritis of left shoulder 03/22/2022   Allergic rhinitis 03/14/2020   Fibromyalgia 03/14/2020   Sciatic nerve pain 03/14/2020   Trigger thumb, left thumb 04/02/2019   Degenerative spondylolisthesis 12/07/2016   Mild intermittent asthma with exacerbation 11/05/2016   Pain of left sacroiliac joint 04/18/2016   Trochanteric bursitis of both hips 03/16/2016   Ischial bursitis 12/15/2015   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   Sickle cell trait (HCC) 01/20/2013   Synovitis of foot 12/26/2012   Arthralgia 12/08/2012   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   Cervical spinal stenosis 11/12/2012   Brachial neuritis 11/12/2012   Left lumbar radiculopathy 05/13/2012   Irritable bowel syndrome 11/29/2011   Morbid obesity (HCC)    ALLERGIC RHINITIS, SEASONAL 06/08/2010   EDEMA LEG 03/22/2010    DERMATITIS, ATOPIC 02/09/2010   Vitamin D deficiency 04/15/2008    PCP: Harvie Heck MD  REFERRING PROVIDER: Monica Becton, MD  REFERRING DIAG: M19.012 (ICD-10-CM) - Primary osteoarthritis of left shoulder  THERAPY DIAG:  Stiffness of left shoulder, not elsewhere classified  Left shoulder pain, unspecified chronicity  Muscle weakness (generalized)  Rationale for Evaluation and Treatment: Rehabilitation  ONSET DATE: Beginning of January 2024  SUBJECTIVE:  SUBJECTIVE STATEMENT: Pt states she fell and extended her arm and hurt her shoulder in the beginning of January. Pt reports she got a shot and her L shoulder has been feeling better. Pt is right hand dominant. Pt states she can do all of her normal functional mobility but was recommended by Dr. Karie Schwalbe to come to PT due to her shoulder OA and weakness.  PERTINENT HISTORY: Fibromyalgia  PAIN:  Are you having pain? No  PRECAUTIONS: None  WEIGHT BEARING RESTRICTIONS: No  FALLS:  Has patient fallen in last 6 months? Yes. Number of falls 1  LIVING ENVIRONMENT: Lives with: lives with their family 2 daughters and husband Lives in: House/apartment Stairs: No Has following equipment at home: None  OCCUPATION: Retired  PLOF: Independent  PATIENT GOALS:Preventative management of shoulder  NEXT MD VISIT:   OBJECTIVE:   DIAGNOSTIC FINDINGS:  Shoulder x-ray 02/21/22: Negative  PATIENT SURVEYS:  FOTO 51; predicted 67  COGNITION: Overall cognitive status: Within functional limits for tasks assessed     SENSATION: WFL  POSTURE: Anterior pelvic tilt  UPPER EXTREMITY ROM:   Active ROM Right eval Left eval  Shoulder flexion 158 135*  Shoulder extension 80 80  Shoulder abduction 160 140*  Shoulder adduction    Shoulder internal  rotation Btwn shoulder blades Btwn shoulder blades *   Shoulder external rotation T7 T5  Elbow flexion    Elbow extension    Wrist flexion    Wrist extension    Wrist ulnar deviation    Wrist radial deviation    Wrist pronation    Wrist supination    (Blank rows = not tested) * = pain  UPPER EXTREMITY MMT:  MMT Right eval Left eval  Shoulder flexion 5 4-  Shoulder extension 4- 3+  Shoulder abduction 5 4-  Shoulder adduction    Shoulder internal rotation 5 4+  Shoulder external rotation 5 4-  Middle trapezius    Lower trapezius    Elbow flexion    Elbow extension    Wrist flexion    Wrist extension    Wrist ulnar deviation    Wrist radial deviation    Wrist pronation    Wrist supination    Grip strength (lbs) 50, 53, 55 50, 55, 50  (Blank rows = not tested)  SHOULDER SPECIAL TESTS: Impingement tests: Neer impingement test: positive  and Hawkins/Kennedy impingement test: positive   JOINT MOBILITY TESTING:  WFL  PALPATION:  TTP L pec major, minor, lat, UT   TODAY'S TREATMENT:                                                                                                                                         DATE: 03/27/22 See HEP  PATIENT EDUCATION: Education details: Exam findings, POC, HEP Person educated: Patient Education method: Explanation, Demonstration, and Handouts Education comprehension: verbalized understanding, returned demonstration, and needs further education  HOME EXERCISE PROGRAM: Access Code: [email or web address] URL: [email or web address] Date: 03/27/2022 Prepared by: Vernon Prey April Kirstie Peri  Exercises - Doorway Pec Stretch at 60 Elevation  - 1 x daily - 7 x weekly - 2 sets - 30 sec hold - Doorway Pec Stretch at 90 Degrees Abduction  - 1 x daily - 7 x weekly - 2 sets - 30 sec hold - Doorway Pec Stretch at 120 Degrees Abduction  - 1 x daily - 7 x weekly - 2 sets - 30 sec hold - Shoulder External Rotation and Scapular Retraction  with Resistance  - 1 x daily - 7 x weekly - 2 sets - 10 reps - Shoulder External Rotation in 45 Degrees Abduction  - 1 x daily - 7 x weekly - 2 sets - 10 reps - Standing Shoulder Row with Anchored Resistance  - 1 x daily - 7 x weekly - 2 sets - 10 reps  ASSESSMENT:  CLINICAL IMPRESSION: Patient is a 57 y.o. F who was seen today for physical therapy evaluation and treatment for L shoulder pain s/p fall. Pt reports she is able to do all of her normal activities. Assessment significant for decreased L shoulder AROM compared to R, gross L shoulder weakness, and shoulder pain at end range ROM. Pt would benefit from PT to address these issues to prevent other issues or shoulder injury.   OBJECTIVE IMPAIRMENTS: decreased ROM, decreased strength, increased fascial restrictions, increased muscle spasms, impaired UE functional use, and pain.   ACTIVITY LIMITATIONS: carrying, lifting, and reach over head  PARTICIPATION LIMITATIONS: cleaning and community activity  PERSONAL FACTORS: Past/current experiences and Time since onset of injury/illness/exacerbation are also affecting patient's functional outcome.   REHAB POTENTIAL: Good  CLINICAL DECISION MAKING: Stable/uncomplicated  EVALUATION COMPLEXITY: Low   GOALS: Goals reviewed with patient? Yes  SHORT TERM GOALS: Target date: 04/24/2022   Pt will be ind with initial HEP Baseline: Goal status: INITIAL   LONG TERM GOALS: Target date: 05/08/2022   Pt will demo L = R shoulder ROM without pain Baseline:  Goal status: INITIAL  2.  Pt will be able to lift and carry at least 5# overhead to reach in/out of high kitchen cabinets Baseline:  Goal status: INITIAL  3.  Pt will have improved FOTO to 67 Baseline:  Goal status: INITIAL   PLAN:  PT FREQUENCY: 1x/week  PT DURATION: 6 weeks  PLANNED INTERVENTIONS: Therapeutic exercises, Therapeutic activity, Neuromuscular re-education, Balance training, Gait training, Patient/Family education,  Self Care, Joint mobilization, Dry Needling, Electrical stimulation, Cryotherapy, Moist heat, Taping, Vasopneumatic device, Ionotophoresis 4mg /ml Dexamethasone, Manual therapy, and Re-evaluation  PLAN FOR NEXT SESSION: Assess response to HEP. Continue to strengthen posterior shoulder girdle and rotator cuff. Stretch pecs and lats.    Javanna Patin April Ma L Allisen Pidgeon, PT 03/27/2022, 2:08 PM

## 2022-03-27 ENCOUNTER — Other Ambulatory Visit: Payer: Self-pay

## 2022-03-27 ENCOUNTER — Encounter: Payer: Self-pay | Admitting: Physical Therapy

## 2022-03-27 ENCOUNTER — Ambulatory Visit: Payer: Federal, State, Local not specified - PPO | Attending: Sports Medicine | Admitting: Physical Therapy

## 2022-03-27 DIAGNOSIS — M6281 Muscle weakness (generalized): Secondary | ICD-10-CM

## 2022-03-27 DIAGNOSIS — M25612 Stiffness of left shoulder, not elsewhere classified: Secondary | ICD-10-CM | POA: Diagnosis present

## 2022-03-27 DIAGNOSIS — M5442 Lumbago with sciatica, left side: Secondary | ICD-10-CM | POA: Insufficient documentation

## 2022-03-27 DIAGNOSIS — M19012 Primary osteoarthritis, left shoulder: Secondary | ICD-10-CM | POA: Insufficient documentation

## 2022-03-27 DIAGNOSIS — M25512 Pain in left shoulder: Secondary | ICD-10-CM | POA: Insufficient documentation

## 2022-03-27 DIAGNOSIS — R262 Difficulty in walking, not elsewhere classified: Secondary | ICD-10-CM | POA: Diagnosis present

## 2022-04-03 ENCOUNTER — Ambulatory Visit: Payer: Federal, State, Local not specified - PPO

## 2022-04-03 DIAGNOSIS — M25612 Stiffness of left shoulder, not elsewhere classified: Secondary | ICD-10-CM | POA: Diagnosis not present

## 2022-04-03 DIAGNOSIS — M6281 Muscle weakness (generalized): Secondary | ICD-10-CM

## 2022-04-03 DIAGNOSIS — M25512 Pain in left shoulder: Secondary | ICD-10-CM

## 2022-04-03 DIAGNOSIS — M5442 Lumbago with sciatica, left side: Secondary | ICD-10-CM

## 2022-04-03 DIAGNOSIS — R262 Difficulty in walking, not elsewhere classified: Secondary | ICD-10-CM

## 2022-04-03 NOTE — Therapy (Signed)
OUTPATIENT PHYSICAL THERAPY SHOULDER T   Patient Name: Rhonda Oneal MRN: [id number] DOB:1965/08/20, 57 y.o., female Today's Date: 04/03/2022  END OF SESSION:  PT End of Session - 04/03/22 0900     Visit Number 2    Number of Visits 8    Date for PT Re-Evaluation 05/08/22    Authorization Type [employer]    PT Start Time 0857   arrived late   PT Stop Time 0943    PT Time Calculation (min) 46 min    Activity Tolerance Patient tolerated treatment well    Behavior During Therapy [facility] for tasks assessed/performed              Past Medical History:  Diagnosis Date   Allergy to mold    Arthritis    Asthma    DERMATITIS, ATOPIC 02/09/2010   Qualifier: Diagnosis of  By: Koleen Nimrod MD, Dellis Filbert     EDEMA LEG 03/22/2010   Qualifier: Diagnosis of  By: Koleen Nimrod MD, Dellis Filbert     Fibromyalgia    IBS (irritable bowel syndrome)    Morbid obesity ([place])    Past Surgical History:  Procedure Laterality Date   CESAREAN SECTION     TONSILLECTOMY AND ADENOIDECTOMY     WISDOM TOOTH EXTRACTION     Patient Active Problem List   Diagnosis Date Noted   Primary osteoarthritis of left shoulder 03/22/2022   Allergic rhinitis 03/14/2020   Fibromyalgia 03/14/2020   Sciatic nerve pain 03/14/2020   Trigger thumb, left thumb 04/02/2019   Degenerative spondylolisthesis 12/07/2016   Mild intermittent asthma with exacerbation 11/05/2016   Pain of left sacroiliac joint 04/18/2016   Trochanteric bursitis of both hips 03/16/2016   Ischial bursitis 12/15/2015   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   Sickle cell trait ([place]) 01/20/2013   Synovitis of foot 12/26/2012   Arthralgia 12/08/2012   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   Cervical spinal stenosis 11/12/2012   Brachial neuritis 11/12/2012   Left lumbar radiculopathy 05/13/2012   Irritable bowel syndrome 11/29/2011   Morbid obesity ([place])    ALLERGIC RHINITIS, SEASONAL 06/08/2010   EDEMA LEG  03/22/2010   DERMATITIS, ATOPIC 02/09/2010   Vitamin D deficiency 04/15/2008    PCP: Hermine Messick MD  REFERRING PROVIDER: Silverio Decamp, MD  REFERRING DIAG: M19.012 (ICD-10-CM) - Primary osteoarthritis of left shoulder  THERAPY DIAG:  Stiffness of left shoulder, not elsewhere classified  Left shoulder pain, unspecified chronicity  Muscle weakness (generalized)  Left-sided low back pain with left-sided sciatica, unspecified chronicity  Difficulty in walking, not elsewhere classified  Rationale for Evaluation and Treatment: Rehabilitation  ONSET DATE: Beginning of January 2024  SUBJECTIVE:  SUBJECTIVE STATEMENT: Patient reports no changes in symptoms since eval; reports 2/10 pain at rest and 4/10 pain with overhead reaching.  PERTINENT HISTORY: Fibromyalgia  PAIN:  Are you having pain? No  PRECAUTIONS: None  WEIGHT BEARING RESTRICTIONS: No  FALLS:  Has patient fallen in last 6 months? Yes. Number of falls 1  LIVING ENVIRONMENT: Lives with: lives with their family 2 daughters and husband Lives in: House/apartment Stairs: No Has following equipment at home: None  OCCUPATION: Retired  PLOF: Independent  PATIENT GOALS:Preventative management of shoulder  NEXT MD VISIT:   OBJECTIVE:   DIAGNOSTIC FINDINGS:  Shoulder x-ray 02/21/22: Negative  PATIENT SURVEYS:  FOTO 51; predicted 72  COGNITION: Overall cognitive status: Within functional limits for tasks assessed     SENSATION: WFL  POSTURE: Anterior pelvic tilt  UPPER EXTREMITY ROM:   Active ROM Right eval Left eval  Shoulder flexion 158 135*  Shoulder extension 80 80  Shoulder abduction 160 140*  Shoulder adduction    Shoulder internal rotation Btwn shoulder blades Btwn shoulder blades *   Shoulder  external rotation T7 T5  Elbow flexion    Elbow extension    Wrist flexion    Wrist extension    Wrist ulnar deviation    Wrist radial deviation    Wrist pronation    Wrist supination    (Blank rows = not tested) * = pain  UPPER EXTREMITY MMT:  MMT Right eval Left eval  Shoulder flexion 5 4-  Shoulder extension 4- 3+  Shoulder abduction 5 4-  Shoulder adduction    Shoulder internal rotation 5 4+  Shoulder external rotation 5 4-  Middle trapezius    Lower trapezius    Elbow flexion    Elbow extension    Wrist flexion    Wrist extension    Wrist ulnar deviation    Wrist radial deviation    Wrist pronation    Wrist supination    Grip strength (lbs) 50, 53, 55 50, 55, 50  (Blank rows = not tested)  SHOULDER SPECIAL TESTS: Impingement tests: Neer impingement test: positive  and Hawkins/Kennedy impingement test: positive   JOINT MOBILITY TESTING:  WFL  PALPATION:  TTP L pec major, minor, lat, UT   TODAY'S TREATMENT:     [facility] Adult PT Treatment:                                                DATE: 04/03/2022 Therapeutic Exercise: Doorway pec stretch 3x30" Supine: Shoulder flexion w/dowel Bent arm elbow lifts 2#DB Straight arm small circles 2#DB Thoracic ext w/coregeous ball --> 2# dowel scap pron/retract, shoulder flexion Seated scap squeezes 10x3" No monies YTB 2x10 Shoulder ER/IR isometric step put RTB RTB shoulder extension 2x10 Drawing bow RTB L 2x10, R x10 Prone Y/T 1#DB x10 each  L lat stretch Modalities: Moist heat L shoulder x 10 min  DATE: 03/27/22 See HEP  PATIENT EDUCATION: Education details: Exam findings, POC, HEP Person educated: Patient Education method: Explanation, Demonstration, and Handouts Education comprehension: verbalized understanding, returned demonstration, and needs further education  HOME  EXERCISE PROGRAM: Access Code: [email or web address] URL: [email or web address] Date: 03/27/2022 Prepared by: Estill Bamberg April Thurnell Garbe  Exercises - Doorway Pec Stretch at 60 Elevation  - 1 x daily - 7 x weekly - 2 sets - 30 sec hold - Doorway Pec Stretch at 90 Degrees Abduction  - 1 x daily - 7 x weekly - 2 sets - 30 sec hold - Doorway Pec Stretch at 120 Degrees Abduction  - 1 x daily - 7 x weekly - 2 sets - 30 sec hold - Shoulder External Rotation and Scapular Retraction with Resistance  - 1 x daily - 7 x weekly - 2 sets - 10 reps - Shoulder External Rotation in 45 Degrees Abduction  - 1 x daily - 7 x weekly - 2 sets - 10 reps - Standing Shoulder Row with Anchored Resistance  - 1 x daily - 7 x weekly - 2 sets - 10 reps  ASSESSMENT:  CLINICAL IMPRESSION: Tactile cues improved scapular retraction and depression during prone arm lifts. Performing drawing bow exercise on R side improved proprioceptive awareness and postural alignment when performing exercise on L side. Patient able to complete prone arm raises in scaption and abduction with no pain, however mild pain reported with shoulder flexion.  OBJECTIVE IMPAIRMENTS: decreased ROM, decreased strength, increased fascial restrictions, increased muscle spasms, impaired UE functional use, and pain.   ACTIVITY LIMITATIONS: carrying, lifting, and reach over head  PARTICIPATION LIMITATIONS: cleaning and community activity  PERSONAL FACTORS: Past/current experiences and Time since onset of injury/illness/exacerbation are also affecting patient's functional outcome.   REHAB POTENTIAL: Good  CLINICAL DECISION MAKING: Stable/uncomplicated  EVALUATION COMPLEXITY: Low   GOALS: Goals reviewed with patient? Yes  SHORT TERM GOALS: Target date: 04/24/2022   Pt will be ind with initial HEP Baseline: Goal status: INITIAL   LONG TERM GOALS: Target date: 05/08/2022   Pt will demo L = R shoulder ROM without pain Baseline:  Goal status:  INITIAL  2.  Pt will be able to lift and carry at least 5# overhead to reach in/out of high kitchen cabinets Baseline:  Goal status: INITIAL  3.  Pt will have improved FOTO to 67 Baseline:  Goal status: INITIAL   PLAN:  PT FREQUENCY: 1x/week  PT DURATION: 6 weeks  PLANNED INTERVENTIONS: Therapeutic exercises, Therapeutic activity, Neuromuscular re-education, Balance training, Gait training, Patient/Family education, Self Care, Joint mobilization, Dry Needling, Electrical stimulation, Cryotherapy, Moist heat, Taping, Vasopneumatic device, Ionotophoresis 25m/ml Dexamethasone, Manual therapy, and Re-evaluation  PLAN FOR NEXT SESSION: Continue to strengthen posterior shoulder girdle and rotator cuff. Stretch pecs and lats.    KHardin Negus PTA 04/03/2022, 9:54 AM

## 2022-04-10 ENCOUNTER — Ambulatory Visit: Payer: Federal, State, Local not specified - PPO

## 2022-04-10 DIAGNOSIS — M25512 Pain in left shoulder: Secondary | ICD-10-CM

## 2022-04-10 DIAGNOSIS — M6281 Muscle weakness (generalized): Secondary | ICD-10-CM

## 2022-04-10 DIAGNOSIS — M5442 Lumbago with sciatica, left side: Secondary | ICD-10-CM

## 2022-04-10 DIAGNOSIS — R262 Difficulty in walking, not elsewhere classified: Secondary | ICD-10-CM

## 2022-04-10 DIAGNOSIS — M25612 Stiffness of left shoulder, not elsewhere classified: Secondary | ICD-10-CM

## 2022-04-10 NOTE — Therapy (Signed)
OUTPATIENT PHYSICAL THERAPY SHOULDER TREATMENT   Patient Name: Rhonda Oneal MRN: [id number] DOB:03/04/1965, 57 y.o., female Today's Date: 04/10/2022  END OF SESSION:  PT End of Session - 04/10/22 0849     Visit Number 3    Number of Visits 8    Date for PT Re-Evaluation 05/08/22    Authorization Type BCBS    PT Start Time 0848    PT Stop Time 0938    PT Time Calculation (min) 50 min    Activity Tolerance Patient tolerated treatment well    Behavior During Therapy [facility] for tasks assessed/performed              Past Medical History:  Diagnosis Date   Allergy to mold    Arthritis    Asthma    DERMATITIS, ATOPIC 02/09/2010   Qualifier: Diagnosis of  By: Koleen Nimrod MD, Dellis Filbert     EDEMA LEG 03/22/2010   Qualifier: Diagnosis of  By: Koleen Nimrod MD, Dellis Filbert     Fibromyalgia    IBS (irritable bowel syndrome)    Morbid obesity ([place])    Past Surgical History:  Procedure Laterality Date   CESAREAN SECTION     TONSILLECTOMY AND ADENOIDECTOMY     WISDOM TOOTH EXTRACTION     Patient Active Problem List   Diagnosis Date Noted   Primary osteoarthritis of left shoulder 03/22/2022   Allergic rhinitis 03/14/2020   Fibromyalgia 03/14/2020   Sciatic nerve pain 03/14/2020   Trigger thumb, left thumb 04/02/2019   Degenerative spondylolisthesis 12/07/2016   Mild intermittent asthma with exacerbation 11/05/2016   Pain of left sacroiliac joint 04/18/2016   Trochanteric bursitis of both hips 03/16/2016   Ischial bursitis 12/15/2015   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   Sickle cell trait ([place]) 01/20/2013   Synovitis of foot 12/26/2012   Arthralgia 12/08/2012   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   Cervical spinal stenosis 11/12/2012   Brachial neuritis 11/12/2012   Left lumbar radiculopathy 05/13/2012   Irritable bowel syndrome 11/29/2011   Morbid obesity ([place])    ALLERGIC RHINITIS, SEASONAL 06/08/2010   EDEMA LEG 03/22/2010    DERMATITIS, ATOPIC 02/09/2010   Vitamin D deficiency 04/15/2008    PCP: Hermine Messick MD  REFERRING PROVIDER: Silverio Decamp, MD  REFERRING DIAG: M19.012 (ICD-10-CM) - Primary osteoarthritis of left shoulder  THERAPY DIAG:  Left shoulder pain, unspecified chronicity  Stiffness of left shoulder, not elsewhere classified  Muscle weakness (generalized)  Left-sided low back pain with left-sided sciatica, unspecified chronicity  Difficulty in walking, not elsewhere classified  Rationale for Evaluation and Treatment: Rehabilitation  ONSET DATE: Beginning of January 2024  SUBJECTIVE:  SUBJECTIVE STATEMENT: Patient reports no pain in L shoulder but R shoulder is bothering her. Patient states her fibromyalgia is flared up today.   PERTINENT HISTORY: Fibromyalgia  PAIN:  Are you having pain? No  PRECAUTIONS: None  WEIGHT BEARING RESTRICTIONS: No  FALLS:  Has patient fallen in last 6 months? Yes. Number of falls 1  LIVING ENVIRONMENT: Lives with: lives with their family 2 daughters and husband Lives in: House/apartment Stairs: No Has following equipment at home: None  OCCUPATION: Retired  PLOF: Independent  PATIENT GOALS:Preventative management of shoulder  NEXT MD VISIT:   OBJECTIVE:   DIAGNOSTIC FINDINGS:  Shoulder x-ray 02/21/22: Negative  PATIENT SURVEYS:  FOTO 51; predicted 107  COGNITION: Overall cognitive status: Within functional limits for tasks assessed     SENSATION: WFL  POSTURE: Anterior pelvic tilt  UPPER EXTREMITY ROM:   Active ROM Right eval Left eval  Shoulder flexion 158 135*  Shoulder extension 80 80  Shoulder abduction 160 140*  Shoulder adduction    Shoulder internal rotation Btwn shoulder blades Btwn shoulder blades *   Shoulder external  rotation T7 T5  Elbow flexion    Elbow extension    Wrist flexion    Wrist extension    Wrist ulnar deviation    Wrist radial deviation    Wrist pronation    Wrist supination    (Blank rows = not tested) * = pain  UPPER EXTREMITY MMT:  MMT Right eval Left eval  Shoulder flexion 5 4-  Shoulder extension 4- 3+  Shoulder abduction 5 4-  Shoulder adduction    Shoulder internal rotation 5 4+  Shoulder external rotation 5 4-  Middle trapezius    Lower trapezius    Elbow flexion    Elbow extension    Wrist flexion    Wrist extension    Wrist ulnar deviation    Wrist radial deviation    Wrist pronation    Wrist supination    Grip strength (lbs) 50, 53, 55 50, 55, 50  (Blank rows = not tested)  SHOULDER SPECIAL TESTS: Impingement tests: Neer impingement test: positive  and Hawkins/Kennedy impingement test: positive   JOINT MOBILITY TESTING:  WFL  PALPATION:  TTP L pec major, minor, lat, UT   TODAY'S TREATMENT:    [facility] Adult PT Treatment:                                                DATE: 04/10/2022 Therapeutic Exercise: Seated + moist heat on shoulders: Bkwd shoulder circles Scap squeezes with pool noodle Alternating unilateral shoulder circles CW/CCW No monies x20 Rows w/scap squeeze x20 Drawing bow x10 Doorway pec stretch 3x30" Supine: Sacp punches Swimming Arm circles CW/CCW Shoulder flexion & scaption stretch at counter 10x5" each Seated  dynamic pec stretch x10 chin tucks 10x3" dynamic cervical stretches: rotation, lateral flexion, circles CW/CCW single arm circles w/trunk rotation Modalities: Moist heat shoulders & hands x 10 min     [facility] Adult PT Treatment:                                                DATE: 04/03/2022 Therapeutic Exercise: Doorway pec stretch 3x30" Supine: Shoulder flexion w/dowel Bent arm elbow lifts 2#DB  Straight arm small circles 2#DB Thoracic ext w/coregeous ball --> 2# dowel scap pron/retract, shoulder  flexion Seated scap squeezes 10x3" No monies YTB 2x10 Shoulder ER/IR isometric step put RTB RTB shoulder extension 2x10 Drawing bow RTB L 2x10, R x10 Prone Y/T 1#DB x10 each  L lat stretch Modalities: Moist heat L shoulder x 10 min                                                                                                                                        PATIENT EDUCATION: Education details: Gentle stretching as tolerated with fibromylagia Person educated: Patient Education method: Explanation, Demonstration, and Handouts Education comprehension: verbalized understanding, returned demonstration, and needs further education  HOME EXERCISE PROGRAM: Access Code: [email or web address] URL: [email or web address] Date: 03/27/2022 Prepared by: Estill Bamberg April Thurnell Garbe  Exercises - Doorway Pec Stretch at 60 Elevation  - 1 x daily - 7 x weekly - 2 sets - 30 sec hold - Doorway Pec Stretch at 90 Degrees Abduction  - 1 x daily - 7 x weekly - 2 sets - 30 sec hold - Doorway Pec Stretch at 120 Degrees Abduction  - 1 x daily - 7 x weekly - 2 sets - 30 sec hold - Shoulder External Rotation and Scapular Retraction with Resistance  - 1 x daily - 7 x weekly - 2 sets - 10 reps - Shoulder External Rotation in 45 Degrees Abduction  - 1 x daily - 7 x weekly - 2 sets - 10 reps - Standing Shoulder Row with Anchored Resistance  - 1 x daily - 7 x weekly - 2 sets - 10 reps  ASSESSMENT:  CLINICAL IMPRESSION:  Patient unable to perform exercises requiring resistance or hand grip due to significant discomfort from fibromyalgia; exercises modified to meet patient's tolerance for exercise today, focusing on gently mobility and ROM and stretching.    OBJECTIVE IMPAIRMENTS: decreased ROM, decreased strength, increased fascial restrictions, increased muscle spasms, impaired UE functional use, and pain.   ACTIVITY LIMITATIONS: carrying, lifting, and reach over head  PARTICIPATION LIMITATIONS:  cleaning and community activity  PERSONAL FACTORS: Past/current experiences and Time since onset of injury/illness/exacerbation are also affecting patient's functional outcome.   REHAB POTENTIAL: Good  CLINICAL DECISION MAKING: Stable/uncomplicated  EVALUATION COMPLEXITY: Low   GOALS: Goals reviewed with patient? Yes  SHORT TERM GOALS: Target date: 04/24/2022   Pt will be ind with initial HEP Baseline: Goal status: INITIAL   LONG TERM GOALS: Target date: 05/08/2022   Pt will demo L = R shoulder ROM without pain Baseline:  Goal status: INITIAL  2.  Pt will be able to lift and carry at least 5# overhead to reach in/out of high kitchen cabinets Baseline:  Goal status: INITIAL  3.  Pt will have improved FOTO to 67 Baseline:  Goal status: INITIAL   PLAN:  PT FREQUENCY: 1x/week  PT DURATION: 6 weeks  PLANNED  INTERVENTIONS: Therapeutic exercises, Therapeutic activity, Neuromuscular re-education, Balance training, Gait training, Patient/Family education, Self Care, Joint mobilization, Dry Needling, Electrical stimulation, Cryotherapy, Moist heat, Taping, Vasopneumatic device, Ionotophoresis 38m/ml Dexamethasone, Manual therapy, and Re-evaluation  PLAN FOR NEXT SESSION: Continue to strengthen posterior shoulder girdle and rotator cuff. Stretch pecs and lats.    KHardin Negus PTA 04/10/2022, 11:59 AM

## 2022-04-17 ENCOUNTER — Ambulatory Visit: Payer: Federal, State, Local not specified - PPO

## 2022-04-25 ENCOUNTER — Ambulatory Visit: Payer: Federal, State, Local not specified - PPO

## 2022-05-01 ENCOUNTER — Ambulatory Visit: Payer: Federal, State, Local not specified - PPO | Attending: Sports Medicine | Admitting: Physical Therapy

## 2022-05-01 ENCOUNTER — Encounter: Payer: Self-pay | Admitting: Physical Therapy

## 2022-05-01 DIAGNOSIS — M25512 Pain in left shoulder: Secondary | ICD-10-CM

## 2022-05-01 DIAGNOSIS — R262 Difficulty in walking, not elsewhere classified: Secondary | ICD-10-CM | POA: Diagnosis present

## 2022-05-01 DIAGNOSIS — M5442 Lumbago with sciatica, left side: Secondary | ICD-10-CM

## 2022-05-01 DIAGNOSIS — M25612 Stiffness of left shoulder, not elsewhere classified: Secondary | ICD-10-CM

## 2022-05-01 DIAGNOSIS — M6281 Muscle weakness (generalized): Secondary | ICD-10-CM | POA: Diagnosis present

## 2022-05-01 NOTE — Therapy (Signed)
OUTPATIENT PHYSICAL THERAPY SHOULDER TREATMENT   Patient Name: Rhonda Oneal MRN: [id number] DOB:1966-02-14, 57 y.o., female Today's Date: 05/01/2022  END OF SESSION:  PT End of Session - 05/01/22 0854     Visit Number 4    Number of Visits 8    Date for PT Re-Evaluation 05/08/22    Authorization Type BCBS    PT Start Time [phone]   late arrival   PT Stop Time 0930    PT Time Calculation (min) 36 min    Activity Tolerance Patient tolerated treatment well    Behavior During Therapy WFL for tasks assessed/performed              Past Medical History:  Diagnosis Date   Allergy to mold    Arthritis    Asthma    DERMATITIS, ATOPIC 02/09/2010   Qualifier: Diagnosis of  By: Koleen Nimrod MD, Dellis Filbert     EDEMA LEG 03/22/2010   Qualifier: Diagnosis of  By: Koleen Nimrod MD, Dellis Filbert     Fibromyalgia    IBS (irritable bowel syndrome)    Morbid obesity ([place])    Past Surgical History:  Procedure Laterality Date   CESAREAN SECTION     TONSILLECTOMY AND ADENOIDECTOMY     WISDOM TOOTH EXTRACTION     Patient Active Problem List   Diagnosis Date Noted   Primary osteoarthritis of left shoulder 03/22/2022   Allergic rhinitis 03/14/2020   Fibromyalgia 03/14/2020   Sciatic nerve pain 03/14/2020   Trigger thumb, left thumb 04/02/2019   Degenerative spondylolisthesis 12/07/2016   Mild intermittent asthma with exacerbation 11/05/2016   Pain of left sacroiliac joint 04/18/2016   Trochanteric bursitis of both hips 03/16/2016   Ischial bursitis 12/15/2015   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   Sickle cell trait ([place]) 01/20/2013   Synovitis of foot 12/26/2012   Arthralgia 12/08/2012   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   Cervical spinal stenosis 11/12/2012   Brachial neuritis 11/12/2012   Left lumbar radiculopathy 05/13/2012   Irritable bowel syndrome 11/29/2011   Morbid obesity ([place])    ALLERGIC RHINITIS, SEASONAL 06/08/2010   EDEMA LEG  03/22/2010   DERMATITIS, ATOPIC 02/09/2010   Vitamin D deficiency 04/15/2008    PCP: Hermine Messick MD  REFERRING PROVIDER: Silverio Decamp, MD  REFERRING DIAG: M19.012 (ICD-10-CM) - Primary osteoarthritis of left shoulder  THERAPY DIAG:  Left shoulder pain, unspecified chronicity  Stiffness of left shoulder, not elsewhere classified  Muscle weakness (generalized)  Left-sided low back pain with left-sided sciatica, unspecified chronicity  Difficulty in walking, not elsewhere classified  Rationale for Evaluation and Treatment: Rehabilitation  ONSET DATE: Beginning of January 2024  SUBJECTIVE:  SUBJECTIVE STATEMENT: Pt reports shoulders are doing better. Just mild today. Does report she has been taking her gabapentin.   PERTINENT HISTORY: Fibromyalgia  PAIN:  Are you having pain? Yes: NPRS scale: 2/10 Pain location: top of left shoulder Pain description: dull Aggravating factors: lifting Relieving factors: heat, medication  PRECAUTIONS: None  WEIGHT BEARING RESTRICTIONS: No  FALLS:  Has patient fallen in last 6 months? Yes. Number of falls 1  LIVING ENVIRONMENT: Lives with: lives with their family 2 daughters and husband Lives in: House/apartment Stairs: No Has following equipment at home: None  OCCUPATION: Retired  PLOF: Independent  PATIENT GOALS:Preventative management of shoulder  NEXT MD VISIT:   OBJECTIVE:   DIAGNOSTIC FINDINGS:  Shoulder x-ray 02/21/22: Negative  PATIENT SURVEYS:  FOTO 51; predicted 4  COGNITION: Overall cognitive status: Within functional limits for tasks assessed     SENSATION: WFL  POSTURE: Anterior pelvic tilt  UPPER EXTREMITY ROM:   Active ROM Right eval Left eval  Shoulder flexion 158 135*  Shoulder extension 80 80   Shoulder abduction 160 140*  Shoulder adduction    Shoulder internal rotation Btwn shoulder blades Btwn shoulder blades *   Shoulder external rotation T7 T5  Elbow flexion    Elbow extension    Wrist flexion    Wrist extension    Wrist ulnar deviation    Wrist radial deviation    Wrist pronation    Wrist supination    (Blank rows = not tested) * = pain  UPPER EXTREMITY MMT:  MMT Right eval Left eval  Shoulder flexion 5 4-  Shoulder extension 4- 3+  Shoulder abduction 5 4-  Shoulder adduction    Shoulder internal rotation 5 4+  Shoulder external rotation 5 4-  Middle trapezius    Lower trapezius    Elbow flexion    Elbow extension    Wrist flexion    Wrist extension    Wrist ulnar deviation    Wrist radial deviation    Wrist pronation    Wrist supination    Grip strength (lbs) 50, 53, 55 50, 55, 50  (Blank rows = not tested)  SHOULDER SPECIAL TESTS: Impingement tests: Neer impingement test: positive  and Hawkins/Kennedy impingement test: positive   JOINT MOBILITY TESTING:  WFL  PALPATION:  TTP L pec major, minor, lat, UT   TODAY'S TREATMENT:   [facility] Adult PT Treatment:                                                DATE: 05/01/22 Therapeutic Exercise: Seated pulleys flexion x1 min, scaption x 1 min  Doorway pec stretch mid and high x 30 sec Standing against pool noodle Scap squeeze hands by side x10 W red TB 10 x 3 sec, green TB x10 Shoulder abd with scap squeeze x5 Standing Low trap setting on wall x10 Bow and arrow red TB 2x10 Shoulder ext red TB 2x10   [facility] Adult PT Treatment:                                                DATE: 04/10/2022 Therapeutic Exercise: Seated + moist heat on shoulders: Bkwd shoulder circles Scap squeezes with pool noodle Alternating unilateral shoulder circles  CW/CCW No monies x20 Rows w/scap squeeze x20 Drawing bow x10 Doorway pec stretch 3x30" Supine: Sacp punches Swimming Arm circles CW/CCW Shoulder flexion &  scaption stretch at counter 10x5" each Seated  dynamic pec stretch x10 chin tucks 10x3" dynamic cervical stretches: rotation, lateral flexion, circles CW/CCW single arm circles w/trunk rotation Modalities: Moist heat shoulders & hands x 10 min     [facility] Adult PT Treatment:                                                DATE: 04/03/2022 Therapeutic Exercise: Doorway pec stretch 3x30" Supine: Shoulder flexion w/dowel Bent arm elbow lifts 2#DB Straight arm small circles 2#DB Thoracic ext w/coregeous ball --> 2# dowel scap pron/retract, shoulder flexion Seated scap squeezes 10x3" No monies YTB 2x10 Shoulder ER/IR isometric step put RTB RTB shoulder extension 2x10 Drawing bow RTB L 2x10, R x10 Prone Y/T 1#DB x10 each  L lat stretch Modalities: Moist heat L shoulder x 10 min                                                                                                                                        PATIENT EDUCATION: Education details: Gentle stretching as tolerated with fibromylagia Person educated: Patient Education method: Explanation, Demonstration, and Handouts Education comprehension: verbalized understanding, returned demonstration, and needs further education  HOME EXERCISE PROGRAM: Access Code: [email or web address] URL: [email or web address] Date: 03/27/2022 Prepared by: Estill Bamberg April Thurnell Garbe  Exercises - Doorway Pec Stretch at 60 Elevation  - 1 x daily - 7 x weekly - 2 sets - 30 sec hold - Doorway Pec Stretch at 90 Degrees Abduction  - 1 x daily - 7 x weekly - 2 sets - 30 sec hold - Doorway Pec Stretch at 120 Degrees Abduction  - 1 x daily - 7 x weekly - 2 sets - 30 sec hold - Shoulder External Rotation and Scapular Retraction with Resistance  - 1 x daily - 7 x weekly - 2 sets - 10 reps - Shoulder External Rotation in 45 Degrees Abduction  - 1 x daily - 7 x weekly - 2 sets - 10 reps - Standing Shoulder Row with Anchored Resistance  - 1 x daily - 7 x  weekly - 2 sets - 10 reps  ASSESSMENT:  CLINICAL IMPRESSION:  Pt tolerating more today. Able to progress to green TB with her HEP. Shoulders continue to improve.    OBJECTIVE IMPAIRMENTS: decreased ROM, decreased strength, increased fascial restrictions, increased muscle spasms, impaired UE functional use, and pain.   ACTIVITY LIMITATIONS: carrying, lifting, and reach over head  PARTICIPATION LIMITATIONS: cleaning and community activity  PERSONAL FACTORS: Past/current experiences and Time since onset of injury/illness/exacerbation are also affecting patient's functional outcome.   REHAB POTENTIAL: Good  CLINICAL DECISION MAKING: Stable/uncomplicated  EVALUATION COMPLEXITY: Low   GOALS: Goals reviewed with patient? Yes  SHORT TERM GOALS: Target date: 04/24/2022   Pt will be ind with initial HEP Baseline: Goal status: INITIAL   LONG TERM GOALS: Target date: 05/08/2022   Pt will demo L = R shoulder ROM without pain Baseline:  Goal status: INITIAL  2.  Pt will be able to lift and carry at least 5# overhead to reach in/out of high kitchen cabinets Baseline:  Goal status: INITIAL  3.  Pt will have improved FOTO to 67 Baseline:  Goal status: INITIAL   PLAN:  PT FREQUENCY: 1x/week  PT DURATION: 6 weeks  PLANNED INTERVENTIONS: Therapeutic exercises, Therapeutic activity, Neuromuscular re-education, Balance training, Gait training, Patient/Family education, Self Care, Joint mobilization, Dry Needling, Electrical stimulation, Cryotherapy, Moist heat, Taping, Vasopneumatic device, Ionotophoresis '4mg'$ /ml Dexamethasone, Manual therapy, and Re-evaluation  PLAN FOR NEXT SESSION: Continue to strengthen posterior shoulder girdle and rotator cuff. Stretch pecs and lats.    Lavoy Bernards April Ma L Emanuella Nickle, PT 05/01/2022, 8:54 AM

## 2022-05-03 ENCOUNTER — Ambulatory Visit (INDEPENDENT_AMBULATORY_CARE_PROVIDER_SITE_OTHER): Payer: Federal, State, Local not specified - PPO | Admitting: Podiatry

## 2022-05-03 ENCOUNTER — Encounter: Payer: Self-pay | Admitting: Podiatry

## 2022-05-03 ENCOUNTER — Ambulatory Visit: Payer: Federal, State, Local not specified - PPO | Admitting: Sports Medicine

## 2022-05-03 ENCOUNTER — Ambulatory Visit (INDEPENDENT_AMBULATORY_CARE_PROVIDER_SITE_OTHER): Payer: Federal, State, Local not specified - PPO

## 2022-05-03 DIAGNOSIS — M722 Plantar fascial fibromatosis: Secondary | ICD-10-CM | POA: Diagnosis not present

## 2022-05-03 DIAGNOSIS — M79671 Pain in right foot: Secondary | ICD-10-CM

## 2022-05-03 MED ORDER — DEXAMETHASONE SODIUM PHOSPHATE 120 MG/30ML IJ SOLN
4.0000 mg | Freq: Once | INTRAMUSCULAR | Status: AC
  Administered 2022-05-03: 4 mg via INTRA_ARTICULAR

## 2022-05-03 NOTE — Progress Notes (Signed)
  Subjective:  Patient ID: Rhonda Oneal, female    DOB: 1965/09/12,   MRN: [id number]  Chief Complaint  Patient presents with   Foot Pain    Right foot pain    57 y.o. female presents for return of right heel pain. Relates she was doing well and now the pain has returned. Relates she has still been doing the stretching exercises. She has been using the night splint and taking meloxicam.   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   Denies any other pedal complaints. Denies n/v/f/c.   Past Medical History:  Diagnosis Date   Allergy to mold    Arthritis    Asthma    DERMATITIS, ATOPIC 02/09/2010   Qualifier: Diagnosis of  By: Koleen Nimrod MD, Dellis Filbert     EDEMA LEG 03/22/2010   Qualifier: Diagnosis of  By: Koleen Nimrod MD, Dellis Filbert     Fibromyalgia    IBS (irritable bowel syndrome)    Morbid obesity ([facility])     Objective:  Physical Exam: Vascular: DP/PT pulses 2/4 bilateral. CFT <3 seconds. Normal hair growth on digits. No edema.  Skin. No lacerations or abrasions bilateral feet.  Musculoskeletal: MMT 5/5 bilateral lower extremities in DF, PF, Inversion and Eversion. Deceased ROM in DF of ankle joint.  Mildly tender to plantar right heel mostly lateral calcaneal tubercle and more proximally along proximal heel. Some pain medially. Some pain along achilles tendon. No pain along PT or arch and no pain with calcaneal squeeze.  Neurological: Sensation intact to light touch.   Assessment:   1. Plantar fasciitis of right foot   2. Right foot pain      Plan:  Patient was evaluated and treated and all questions answered. Discussed plantar fasciitis with patient.  X-rays reviewed and discussed with patient. No acute fractures or dislocations noted. Mild spurring noted at inferior calcaneus.  Discussed treatment options including, ice, NSAIDS, supportive shoes, bracing, and stretching.  Continue stretching.  Continue  meloxicam.  Patient requesting injection today. Procedure note below.  Continue night splint and support.  Follow-up as needed.    Procedure:  Discussed etiology, pathology, conservative vs. surgical therapies. At this time a plantar fascial injection was recommended.  The patient agreed and a sterile skin prep was applied.  An injection consisting of  dexamethasone and marcaine mixture was infiltrated at the point of maximal tenderness on the right Heel.  Bandaid applied. The patient tolerated this well and was given instructions for aftercare.    Lorenda Peck, DPM

## 2022-05-06 ENCOUNTER — Ambulatory Visit
Admission: EM | Admit: 2022-05-06 | Discharge: 2022-05-06 | Disposition: A | Discharge status: Home / Self Care | Payer: Federal, State, Local not specified - PPO | Attending: Family Medicine | Admitting: Family Medicine

## 2022-05-06 ENCOUNTER — Encounter: Payer: Self-pay | Admitting: Emergency Medicine

## 2022-05-06 ENCOUNTER — Ambulatory Visit (INDEPENDENT_AMBULATORY_CARE_PROVIDER_SITE_OTHER): Payer: Federal, State, Local not specified - PPO

## 2022-05-06 DIAGNOSIS — M546 Pain in thoracic spine: Secondary | ICD-10-CM

## 2022-05-06 DIAGNOSIS — M5134 Other intervertebral disc degeneration, thoracic region: Secondary | ICD-10-CM

## 2022-05-06 DIAGNOSIS — R0789 Other chest pain: Secondary | ICD-10-CM | POA: Diagnosis not present

## 2022-05-06 MED ORDER — METHYLPREDNISOLONE 4 MG PO TBPK: ORAL_TABLET | ORAL | 0 refills | Status: DC

## 2022-05-06 NOTE — ED Triage Notes (Signed)
Patient c/o left shoulder pain that radiates around under her left breast x 1 day.  No apparent injury.  Hurts to take a deep breath.  Patient has taken hydrocodone, ibuprofen and robaxin.

## 2022-05-06 NOTE — ED Provider Notes (Signed)
[name]    CSN: [id number] Arrival date & time: 05/06/22  1208      History   Chief Complaint Chief Complaint  Patient presents with   Shoulder Pain    HPI Rhonda Oneal Chantay Ellwood is a 57 y.o. female.   HPI  This is a chronic pain patient with history of fibromyalgia and multiple joints body parts with arthralgias and arthritis.  She is here for upper back pain that radiates around from her left shoulder blade to the anterior chest under her left breast.  Worse with deep breath and with movement.  No accident or injury.  No fall.  She has never had this before. Has not had a respiratory infection or cough.  Past Medical History:  Diagnosis Date   Allergy to mold    Arthritis    Asthma    DERMATITIS, ATOPIC 02/09/2010   Qualifier: Diagnosis of  By: Koleen Nimrod MD, Dellis Filbert     EDEMA LEG 03/22/2010   Qualifier: Diagnosis of  By: Koleen Nimrod MD, Dellis Filbert     Fibromyalgia    IBS (irritable bowel syndrome)    Morbid obesity ([place])     Patient Active Problem List   Diagnosis Date Noted   Primary osteoarthritis of left shoulder 03/22/2022   Allergic rhinitis 03/14/2020   Fibromyalgia 03/14/2020   Sciatic nerve pain 03/14/2020   Trigger thumb, left thumb 04/02/2019   Degenerative spondylolisthesis 12/07/2016   Mild intermittent asthma with exacerbation 11/05/2016   Pain of left sacroiliac joint 04/18/2016   Trochanteric bursitis of both hips 03/16/2016   Ischial bursitis 12/15/2015   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   Sickle cell trait ([place]) 01/20/2013   Synovitis of foot 12/26/2012   Arthralgia 12/08/2012   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   Cervical spinal stenosis 11/12/2012   Brachial neuritis 11/12/2012   Left lumbar radiculopathy 05/13/2012   Irritable bowel syndrome 11/29/2011   Morbid obesity ([place])    ALLERGIC RHINITIS, SEASONAL 06/08/2010   EDEMA LEG 03/22/2010   DERMATITIS, ATOPIC 02/09/2010    Vitamin D deficiency 04/15/2008    Past Surgical History:  Procedure Laterality Date   CESAREAN SECTION     TONSILLECTOMY AND ADENOIDECTOMY     WISDOM TOOTH EXTRACTION      OB History     Gravida  2   Para  2   Term      Preterm      AB      Living         SAB      IAB      Ectopic      Multiple      Live Births               Home Medications    Prior to Admission medications   Medication Sig Start Date End Date Taking? Authorizing Provider  dexlansoprazole (DEXILANT) 60 MG capsule Take 60 mg by mouth daily.   Yes [provider]  diclofenac Sodium (VOLTAREN) 1 % GEL APPLY TO AFFECTED AREA 3 TIMES A DAY 12/14/19  Yes [provider]  HYDROcodone-acetaminophen (NORCO/VICODIN) 5-325 MG tablet Take 1 tablet by mouth every 6 (six) hours as needed for moderate pain.   Yes [provider]  ibuprofen (ADVIL) 800 MG tablet  01/07/19  Yes [provider]  lidocaine (LIDODERM) 5 % PLACE 1 PATCH ON THE SKIN DAILY FOR 30 DAY REMOVE & DISCARD PATCH WITHIN 12 HRS OR  AS DIRECTED BY MD 02/15/20  Yes [provider]  methocarbamol (ROBAXIN) 750 MG tablet Take 750 mg by mouth 4 (four) times daily.   Yes [provider]  methylPREDNISolone (MEDROL DOSEPAK) 4 MG TBPK tablet tad 05/06/22  Yes Raylene Everts, MD  pravastatin (PRAVACHOL) 20 MG tablet Take 20 mg by mouth at bedtime. 03/19/19  Yes [provider]  Semaglutide (OZEMPIC, 0.25 OR 0.5 MG/DOSE, [place]) Inject into the skin.   Yes [provider]  losartan-hydrochlorothiazide Konrad Penta) 50-12.5 MG tablet Take by mouth. 12/03/19 12/02/20  [provider]    Family History Family History  Problem Relation Age of Onset   Diabetes Mother    Cancer Mother        Lung   Hyperlipidemia Father    Breast cancer Maternal Aunt    Ovarian cancer Paternal Aunt     Social History Social History   Tobacco Use   Smoking status: Never   Smokeless  tobacco: Never  Vaping Use   Vaping Use: Never used  Substance Use Topics   Alcohol use: No   Drug use: No     Allergies   Pregabalin, Cymbalta [duloxetine hcl], Doxycycline, and Ferrous sulfate   Review of Systems Review of Systems See HPI  Physical Exam Triage Vital Signs ED Triage Vitals  Enc Vitals Group     BP 05/06/22 1239 107/77     Pulse Rate 05/06/22 1239 81     Resp 05/06/22 1239 16     Temp 05/06/22 1239 98.2 F (36.8 C)     Temp Source 05/06/22 1239 Oral     SpO2 05/06/22 1239 99 %     Weight 05/06/22 1243 213 lb (96.6 kg)     Height 05/06/22 1243 5\' 1"  (1.549 m)     Head Circumference --      Peak Flow --      Pain Score 05/06/22 1243 8     Pain Loc --      Pain Edu? --      Excl. in [place]? --    No data found.  Updated Vital Signs BP 107/77 (BP Location: Left Arm)   Pulse 81   Temp 98.2 F (36.8 C) (Oral)   Resp 16   Ht 5\' 1"  (1.549 m)   Wt 96.6 kg   SpO2 99%   BMI 40.25 kg/m   Physical Exam Constitutional:      General: She is not in acute distress.    Appearance: She is well-developed. She is obese. She is ill-appearing.     Comments: Appears uncomfortable  HENT:     Head: Normocephalic and atraumatic.  Eyes:     Conjunctiva/sclera: Conjunctivae normal.     Pupils: Pupils are equal, round, and reactive to light.  Cardiovascular:     Rate and Rhythm: Normal rate.  Pulmonary:     Effort: Pulmonary effort is normal. No respiratory distress.  Abdominal:     General: There is no distension.     Palpations: Abdomen is soft.  Musculoskeletal:        General: Normal range of motion.     Cervical back: Normal range of motion and neck supple. No tenderness.       Back:  Skin:    General: Skin is warm and dry.  Neurological:     Mental Status: She is alert.      UC Treatments / Results  Labs (all labs ordered are listed, but only abnormal results  are displayed) Labs Reviewed - No data to display  EKG   Radiology DG Thoracic  Spine W/Swimmers  Result Date: 05/06/2022 CLINICAL DATA:  Acute onset mid back pain. EXAM: THORACIC SPINE - 3 VIEWS COMPARISON:  None Available. FINDINGS: No evidence for an acute fracture. Multilevel loss of disc height with endplate spurring evident, most pronounced at T9-10 and at T8-9. Visualized lung parenchyma is unremarkable. SIRT normal heart IMPRESSION: Degenerative disc disease without acute bony findings. Electronically Signed   By: Misty Stanley M.D.   On: 05/06/2022 13:34    Procedures Procedures (including critical care time)  Medications Ordered in UC Medications - No data to display  Initial Impression / Assessment and Plan / UC Course  I have reviewed the triage vital signs and the nursing notes.  Pertinent labs & imaging results that were available during my care of the patient were reviewed by me and considered in my medical decision making (see chart for details).  Clinical Course as of 05/06/22 1345  Sun May 06, 2022  1340 DG Thoracic Spine W/Swimmers [YN]    Clinical Course User Index [YN] Raylene Everts, MD    Patient has hydrocodone.  Methocarbamol.  Lyrica.  This is for her chronic pain complaints.  Will add a Medrol Dosepak.  She can follow-up with Dr. Darene Lamer if she fails to improve Final Clinical Impressions(s) / UC Diagnoses   Final diagnoses:  Degenerative disc disease, thoracic  Chest wall pain     Discharge Instructions      Take the Medrol Dosepak as directed.  Take all of day 1 today (3 now and then 3 at bedtime May use ice or heat to area Continue your pain medicine and muscle relaxers as usual See Dr. Darene Lamer if you fail to improve     ED Prescriptions     Medication Sig Dispense Auth. Provider   methylPREDNISolone (MEDROL DOSEPAK) 4 MG TBPK tablet tad 21 tablet Raylene Everts, MD      PDMP not reviewed this encounter.   Raylene Everts, MD 05/06/22 1345

## 2022-05-06 NOTE — Discharge Instructions (Signed)
Take the Medrol Dosepak as directed.  Take all of day 1 today (3 now and then 3 at bedtime May use ice or heat to area Continue your pain medicine and muscle relaxers as usual See Dr. Darene Lamer if you fail to improve

## 2022-05-08 ENCOUNTER — Ambulatory Visit: Payer: Federal, State, Local not specified - PPO

## 2022-05-11 ENCOUNTER — Ambulatory Visit: Payer: Federal, State, Local not specified - PPO | Admitting: Sports Medicine

## 2022-05-12 ENCOUNTER — Other Ambulatory Visit: Payer: Self-pay

## 2022-05-12 ENCOUNTER — Ambulatory Visit
Admission: RE | Admit: 2022-05-12 | Discharge: 2022-05-12 | Disposition: A | Discharge status: Home / Self Care | Payer: Federal, State, Local not specified - PPO | Source: Ambulatory Visit | Attending: Family Medicine | Admitting: Family Medicine

## 2022-05-12 VITALS — BP 100/84 | HR 97 | Temp 98.2°F | Resp 16

## 2022-05-12 DIAGNOSIS — J3489 Other specified disorders of nose and nasal sinuses: Secondary | ICD-10-CM

## 2022-05-12 DIAGNOSIS — J0100 Acute maxillary sinusitis, unspecified: Secondary | ICD-10-CM | POA: Diagnosis not present

## 2022-05-12 MED ORDER — AMOXICILLIN-POT CLAVULANATE 875-125 MG PO TABS: 1.0000 | ORAL_TABLET | Freq: Two times a day (BID) | ORAL | 0 refills | Status: DC

## 2022-05-12 MED ORDER — PREDNISONE 20 MG PO TABS: ORAL_TABLET | ORAL | 0 refills | Status: DC

## 2022-05-12 NOTE — Discharge Instructions (Addendum)
Instructed patient to take medications as directed with food to completion.  Advised patient to take prednisone with first dose of Augmentin for the next 5 of 7 days.  Encouraged patient to increase daily water intake to 64 ounces per day while taking these medications.  Advised if symptoms worsen and/or unresolved please follow-up with PCP or ENT for further evaluation.

## 2022-05-12 NOTE — ED Provider Notes (Signed)
[facility]    CSN: [id number] Arrival date & time: 05/12/22  [id number]      History   Chief Complaint Chief Complaint  Patient presents with   Facial Pain    APPT 9am   Nasal Congestion    HPI Rhonda Oneal is a 57 y.o. female.   HPI PLEASANT 87-YEAR-OLD FEMALE PRESENTS WITH FACIAL PAIN/SINUS PRESSURE AND SINUS NASAL CONGESTION FOR 1 MONTH.  PATIENT REPORTS ENT PERFORMED CT OF MAXILLARY SINUSES AND INFORMED HER THEY WERE CLEAR.  PMH significant for morbid obesity, fibromyalgia, and allergic rhinitis.  Past Medical History:  Diagnosis Date   Allergy to mold    Arthritis    Asthma    DERMATITIS, ATOPIC 02/09/2010   Qualifier: Diagnosis of  By: Koleen Nimrod MD, Dellis Filbert     EDEMA LEG 03/22/2010   Qualifier: Diagnosis of  By: Koleen Nimrod MD, Dellis Filbert     Fibromyalgia    IBS (irritable bowel syndrome)    Morbid obesity ([place])     Patient Active Problem List   Diagnosis Date Noted   Primary osteoarthritis of left shoulder 03/22/2022   Allergic rhinitis 03/14/2020   Fibromyalgia 03/14/2020   Sciatic nerve pain 03/14/2020   Trigger thumb, left thumb 04/02/2019   Degenerative spondylolisthesis 12/07/2016   Mild intermittent asthma with exacerbation 11/05/2016   Pain of left sacroiliac joint 04/18/2016   Trochanteric bursitis of both hips 03/16/2016   Ischial bursitis 12/15/2015   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   Sickle cell trait ([place]) 01/20/2013   Synovitis of foot 12/26/2012   Arthralgia 12/08/2012   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   Cervical spinal stenosis 11/12/2012   Brachial neuritis 11/12/2012   Left lumbar radiculopathy 05/13/2012   Irritable bowel syndrome 11/29/2011   Morbid obesity ([place])    ALLERGIC RHINITIS, SEASONAL 06/08/2010   EDEMA LEG 03/22/2010   DERMATITIS, ATOPIC 02/09/2010   Vitamin D deficiency 04/15/2008    Past Surgical History:  Procedure Laterality Date   CESAREAN SECTION      TONSILLECTOMY AND ADENOIDECTOMY     WISDOM TOOTH EXTRACTION      OB History     Gravida  2   Para  2   Term      Preterm      AB      Living         SAB      IAB      Ectopic      Multiple      Live Births               Home Medications    Prior to Admission medications   Medication Sig Start Date End Date Taking? Authorizing Provider  amoxicillin-clavulanate (AUGMENTIN) 875-125 MG tablet Take 1 tablet by mouth every 12 (twelve) hours. 05/12/22  Yes Eliezer Lofts, FNP  predniSONE (DELTASONE) 20 MG tablet Take 3 tabs PO daily x 5 days. 05/12/22  Yes Eliezer Lofts, FNP  dexlansoprazole (DEXILANT) 60 MG capsule Take 60 mg by mouth daily.    [provider]  diclofenac Sodium (VOLTAREN) 1 % GEL APPLY TO AFFECTED AREA 3 TIMES A DAY 12/14/19   [provider]  HYDROcodone-acetaminophen (NORCO/VICODIN) 5-325 MG tablet Take 1 tablet by mouth every 6 (six) hours as needed for moderate pain.    [provider]  ibuprofen (ADVIL) 800 MG tablet  01/07/19   [provider]  lidocaine (LIDODERM) 5 % PLACE 1 PATCH  ON THE SKIN DAILY FOR 30 DAY REMOVE & DISCARD PATCH WITHIN 12 HRS OR AS DIRECTED BY MD 02/15/20   [provider]  losartan-hydrochlorothiazide Konrad Penta) 50-12.5 MG tablet Take by mouth. 12/03/19 12/02/20  [provider]  methocarbamol (ROBAXIN) 750 MG tablet Take 750 mg by mouth 4 (four) times daily.    [provider]  pravastatin (PRAVACHOL) 20 MG tablet Take 20 mg by mouth at bedtime. 03/19/19   [provider]  Semaglutide (OZEMPIC, 0.25 OR 0.5 MG/DOSE, [place]) Inject into the skin.    [provider]    Family History Family History  Problem Relation Age of Onset   Diabetes Mother    Cancer Mother        Lung   Hyperlipidemia Father    Breast cancer Maternal Aunt    Ovarian cancer Paternal Aunt     Social History Social History   Tobacco Use   Smoking status: Never    Smokeless tobacco: Never  Vaping Use   Vaping Use: Never used  Substance Use Topics   Alcohol use: No   Drug use: No     Allergies   Pregabalin, Cymbalta [duloxetine hcl], Doxycycline, and Ferrous sulfate   Review of Systems Review of Systems  HENT:  Positive for congestion, sinus pressure and sinus pain.   All other systems reviewed and are negative.    Physical Exam Triage Vital Signs ED Triage Vitals  Enc Vitals Group     BP      Pulse      Resp      Temp      Temp src      SpO2      Weight      Height      Head Circumference      Peak Flow      Pain Score      Pain Loc      Pain Edu?      Excl. in [place]?    No data found.  Updated Vital Signs BP 100/84 (BP Location: Right Arm)   Pulse 97   Temp 98.2 F (36.8 C) (Oral)   Resp 16   SpO2 100%       Physical Exam Vitals and nursing note reviewed.  Constitutional:      General: She is not in acute distress.    Appearance: She is obese. She is ill-appearing.  HENT:     Head: Normocephalic and atraumatic.     Right Ear: Tympanic membrane and external ear normal.     Left Ear: Tympanic membrane and external ear normal.     Nose:     Right Turbinates: Enlarged.     Left Turbinates: Enlarged.     Right Sinus: Maxillary sinus tenderness present.     Left Sinus: Maxillary sinus tenderness present.     Mouth/Throat:     Mouth: Mucous membranes are moist.     Pharynx: Oropharynx is clear.  Eyes:     Extraocular Movements: Extraocular movements intact.     Conjunctiva/sclera: Conjunctivae normal.     Pupils: Pupils are equal, round, and reactive to light.  Cardiovascular:     Rate and Rhythm: Normal rate and regular rhythm.     Pulses: Normal pulses.     Heart sounds: Normal heart sounds.  Pulmonary:     Effort: Pulmonary effort is normal.     Breath sounds: Normal breath sounds. No wheezing, rhonchi or rales.  Musculoskeletal:  General: Normal range of motion.     Cervical back: Normal range  of motion and neck supple.  Skin:    General: Skin is warm and dry.  Neurological:     General: No focal deficit present.     Mental Status: She is alert and oriented to person, place, and time.      UC Treatments / Results  Labs (all labs ordered are listed, but only abnormal results are displayed) Labs Reviewed - No data to display  EKG   Radiology No results found.  Procedures Procedures (including critical care time)  Medications Ordered in UC Medications - No data to display  Initial Impression / Assessment and Plan / UC Course  I have reviewed the triage vital signs and the nursing notes.  Pertinent labs & imaging results that were available during my care of the patient were reviewed by me and considered in my medical decision making (see chart for details).     MDM: 1.  Acute maxillary sinusitis, recurrence not specified-Rx'd Augmentin 875 mg twice daily for 7 days; 2.  Sinus pressure-Rx prednisone 60 mg daily x 5 days. Instructed patient to take medications as directed with food to completion.  Advised patient to take prednisone with first dose of Augmentin for the next 5 of 7 days.  Encouraged patient to increase daily water intake to 64 ounces per day while taking these medications.  Advised if symptoms worsen and/or unresolved please follow-up with PCP or ENT for further evaluation.  Patient discharged home, hemodynamically stable. Final Clinical Impressions(s) / UC Diagnoses   Final diagnoses:  Acute maxillary sinusitis, recurrence not specified  Sinus pressure     Discharge Instructions      Instructed patient to take medications as directed with food to completion.  Advised patient to take prednisone with first dose of Augmentin for the next 5 of 7 days.  Encouraged patient to increase daily water intake to 64 ounces per day while taking these medications.  Advised if symptoms worsen and/or unresolved please follow-up with PCP or ENT for further  evaluation.     ED Prescriptions     Medication Sig Dispense Auth. Provider   amoxicillin-clavulanate (AUGMENTIN) 875-125 MG tablet Take 1 tablet by mouth every 12 (twelve) hours. 14 tablet Eliezer Lofts, FNP   predniSONE (DELTASONE) 20 MG tablet Take 3 tabs PO daily x 5 days. 15 tablet Eliezer Lofts, FNP      PDMP not reviewed this encounter.   Eliezer Lofts, [place] 05/12/22 [phone]

## 2022-05-12 NOTE — ED Triage Notes (Signed)
Pt c/o facial pain and congestion x 1 month. Evisit with [facility], rx'd prednisone. Has finished course. Also takes zyrtec daily.

## 2022-05-15 ENCOUNTER — Ambulatory Visit: Payer: Federal, State, Local not specified - PPO | Admitting: Physical Therapy

## 2022-05-16 ENCOUNTER — Other Ambulatory Visit (INDEPENDENT_AMBULATORY_CARE_PROVIDER_SITE_OTHER): Payer: Federal, State, Local not specified - PPO

## 2022-05-16 ENCOUNTER — Ambulatory Visit (INDEPENDENT_AMBULATORY_CARE_PROVIDER_SITE_OTHER): Payer: Federal, State, Local not specified - PPO | Admitting: Sports Medicine

## 2022-05-16 DIAGNOSIS — M7541 Impingement syndrome of right shoulder: Secondary | ICD-10-CM

## 2022-05-16 DIAGNOSIS — M19012 Primary osteoarthritis, left shoulder: Secondary | ICD-10-CM | POA: Diagnosis not present

## 2022-05-16 NOTE — Assessment & Plan Note (Signed)
Fantastic relief after glenohumeral joint injection February 1, return as needed.

## 2022-05-16 NOTE — Assessment & Plan Note (Signed)
Increasing pain right shoulder, localized over the deltoid and worse with abduction, positive impingement signs on exam, waking her from sleep. Subacromial injection as above, x-rays, home physical therapy, return to see me in 6 weeks. MRI if no better.

## 2022-05-16 NOTE — Progress Notes (Signed)
    Procedures performed today:    Procedure: Real-time Ultrasound Guided injection of the right subacromial bursa  device: Samsung HS60  Verbal informed consent obtained.  Time-out conducted.  Noted no overlying erythema, induration, or other signs of local infection.  Skin prepped in a sterile fashion.  Local anesthesia: Topical Ethyl chloride.  With sterile technique and under real time ultrasound guidance: Noted cuff tearing, 1 cc Kenalog 40, 1 cc lidocaine, 1 cc bupivacaine injected easily Completed without difficulty  Advised to call if fevers/chills, erythema, induration, drainage, or persistent bleeding.  Images permanently stored and available for review in PACS.  Impression: Technically successful ultrasound guided injection.  Independent interpretation of notes and tests performed by another provider:   None.  Brief History, Exam, Impression, and Recommendations:    Impingement syndrome, shoulder, right Increasing pain right shoulder, localized over the deltoid and worse with abduction, positive impingement signs on exam, waking her from sleep. Subacromial injection as above, x-rays, home physical therapy, return to see me in 6 weeks. MRI if no better.  Primary osteoarthritis of left shoulder Fantastic relief after glenohumeral joint injection February 1, return as needed.    ____________________________________________ Gwen Her. Dianah Field, M.D., ABFM., CAQSM., AME. Primary Care and Sports Medicine [facility]  Adjunct Professor of [employer]  Risk manager

## 2022-05-17 ENCOUNTER — Ambulatory Visit (INDEPENDENT_AMBULATORY_CARE_PROVIDER_SITE_OTHER): Payer: Federal, State, Local not specified - PPO

## 2022-05-17 DIAGNOSIS — M7541 Impingement syndrome of right shoulder: Secondary | ICD-10-CM

## 2022-05-18 ENCOUNTER — Encounter: Payer: Self-pay | Admitting: Sports Medicine

## 2022-05-21 ENCOUNTER — Ambulatory Visit
Admission: EM | Admit: 2022-05-21 | Discharge: 2022-05-21 | Disposition: A | Discharge status: Home / Self Care | Payer: Federal, State, Local not specified - PPO | Attending: Family Medicine | Admitting: Family Medicine

## 2022-05-21 ENCOUNTER — Ambulatory Visit (INDEPENDENT_AMBULATORY_CARE_PROVIDER_SITE_OTHER): Payer: Federal, State, Local not specified - PPO

## 2022-05-21 ENCOUNTER — Encounter: Payer: Self-pay | Admitting: Emergency Medicine

## 2022-05-21 DIAGNOSIS — S6992XA Unspecified injury of left wrist, hand and finger(s), initial encounter: Secondary | ICD-10-CM | POA: Diagnosis not present

## 2022-05-21 NOTE — ED Triage Notes (Signed)
Patient states that she hit her left hand on a drawer on Thursday.  Patient is having pain from her left hand down to her elbow.  Patient denies any OTC pain meds.

## 2022-05-21 NOTE — ED Provider Notes (Signed)
[name]    CSN: [id number] Arrival date & time: 05/21/22  1653      History   Chief Complaint Chief Complaint  Patient presents with   Hand Injury    HPI Rhonda Oneal is a 57 y.o. female.   HPI 57 year old female presents with radiating left hand pain to left elbow. Reports hitting her door accidentally on Thursday, PMH significant for morbid obesity, fibromyalgia, and sciatic nerve pain.  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/22/2022   Allergic rhinitis 03/14/2020   Fibromyalgia 03/14/2020   Sciatic nerve pain 03/14/2020   Trigger thumb, left thumb 04/02/2019   Degenerative spondylolisthesis 12/07/2016   Mild intermittent asthma with exacerbation 11/05/2016   Pain of left sacroiliac joint 04/18/2016   Trochanteric bursitis of both hips 03/16/2016   Ischial bursitis 12/15/2015   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   Sickle cell trait 01/20/2013   Synovitis of foot 12/26/2012   Arthralgia 12/08/2012   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   Cervical spinal stenosis 11/12/2012   Brachial neuritis 11/12/2012   Left lumbar radiculopathy 05/13/2012   Irritable bowel syndrome 11/29/2011   Morbid obesity    ALLERGIC RHINITIS, SEASONAL 06/08/2010   EDEMA LEG 03/22/2010   DERMATITIS, ATOPIC 02/09/2010   Vitamin D deficiency 04/15/2008    Past Surgical History:  Procedure  Laterality Date   CESAREAN SECTION     TONSILLECTOMY AND ADENOIDECTOMY     WISDOM TOOTH EXTRACTION      OB History     Gravida  2   Para  2   Term      Preterm      AB      Living         SAB      IAB      Ectopic      Multiple      Live Births               Home Medications    Prior to Admission medications   Medication Sig Start Date End Date Taking? Authorizing Provider  dexlansoprazole (DEXILANT) 60 MG capsule Take 60 mg by mouth daily.   Yes [provider]  diclofenac Sodium (VOLTAREN) 1 % GEL APPLY TO AFFECTED AREA 3 TIMES A DAY 12/14/19  Yes [provider]  HYDROcodone-acetaminophen (NORCO/VICODIN) 5-325 MG tablet Take 1 tablet by mouth every 6 (six) hours as needed for moderate pain.   Yes [provider]  ibuprofen (ADVIL) 800 MG tablet  01/07/19  Yes [provider]  lidocaine (LIDODERM) 5 % PLACE 1 PATCH ON THE SKIN DAILY FOR 30 DAY REMOVE & DISCARD PATCH WITHIN 12 HRS OR AS DIRECTED BY MD 02/15/20  Yes [provider]  methocarbamol (ROBAXIN) 750 MG tablet Take 750 mg by mouth 4 (four) times daily.  Yes [provider]  pravastatin (PRAVACHOL) 20 MG tablet Take 20 mg by mouth at bedtime. 03/19/19  Yes [provider]  Semaglutide (OZEMPIC, 0.25 OR 0.5 MG/DOSE, ) Inject into the skin.   Yes [provider]  amoxicillin-clavulanate (AUGMENTIN) 875-125 MG tablet Take 1 tablet by mouth every 12 (twelve) hours. 05/12/22   Eliezer Lofts, FNP  losartan-hydrochlorothiazide Konrad Penta) 50-12.5 MG tablet Take by mouth. 12/03/19 12/02/20  [provider]  predniSONE (DELTASONE) 20 MG tablet Take 3 tabs PO daily x 5 days. 05/12/22   Eliezer Lofts, FNP    Family History Family History  Problem Relation Age of Onset   Diabetes Mother    Cancer Mother        Lung   Hyperlipidemia Father    Breast cancer Maternal Aunt    Ovarian cancer Paternal Aunt     Social  History Social History   Tobacco Use   Smoking status: Never   Smokeless tobacco: Never  Vaping Use   Vaping Use: Never used  Substance Use Topics   Alcohol use: No   Drug use: No     Allergies   Pregabalin, Cymbalta [duloxetine hcl], Doxycycline, and Ferrous sulfate   Review of Systems Review of Systems  Musculoskeletal:        Left hand pain radiating to left elbow for 4 days     Physical Exam Triage Vital Signs ED Triage Vitals  Enc Vitals Group     BP      Pulse      Resp      Temp      Temp src      SpO2      Weight      Height      Head Circumference      Peak Flow      Pain Score      Pain Loc      Pain Edu?      Excl. in [place]?    No data found.  Updated Vital Signs BP 137/81 (BP Location: Right Arm)   Pulse 78   Temp 98.4 F (36.9 C) (Oral)   Resp 18   Ht 5\' 1"  (1.549 m)   Wt 213 lb (96.6 kg)   SpO2 100%   BMI 40.25 kg/m        Physical Exam Vitals and nursing note reviewed.  Constitutional:      Appearance: Normal appearance. She is obese.  HENT:     Head: Normocephalic and atraumatic.     Mouth/Throat:     Mouth: Mucous membranes are moist.     Pharynx: Oropharynx is clear.  Eyes:     Extraocular Movements: Extraocular movements intact.     Conjunctiva/sclera: Conjunctivae normal.     Pupils: Pupils are equal, round, and reactive to light.  Cardiovascular:     Rate and Rhythm: Normal rate and regular rhythm.     Pulses: Normal pulses.     Heart sounds: Normal heart sounds.  Pulmonary:     Effort: Pulmonary effort is normal.     Breath sounds: Normal breath sounds. No wheezing, rhonchi or rales.  Musculoskeletal:        General: Normal range of motion.     Cervical back: Normal range of motion and neck supple.     Comments: Left hand (dorsum): TTP over second and third metacarpal heads with very mild soft tissue swelling noted  Skin:    General: Skin is warm and dry.  Neurological:     General: No focal deficit present.      Mental Status: She is alert and oriented to person, place, and time. Mental status is at baseline.      UC Treatments / Results  Labs (all labs ordered are listed, but only abnormal results are displayed) Labs Reviewed - No data to display  EKG   Radiology DG Hand Complete Left  Result Date: 05/21/2022 CLINICAL DATA:  Injury EXAM: LEFT HAND - COMPLETE 3+ VIEW COMPARISON:  None Available. FINDINGS: There is no evidence of fracture or dislocation. There is no evidence of arthropathy or other focal bone abnormality. Soft tissues are unremarkable. IMPRESSION: Negative. Electronically Signed   By: Donavan Foil M.D.   On: 05/21/2022 18:11    Procedures Procedures (including critical care time)  Medications Ordered in UC Medications - No data to display  Initial Impression / Assessment and Plan / UC Course  I have reviewed the triage vital signs and the nursing notes.  Pertinent labs & imaging results that were available during my care of the patient were reviewed by me and considered in my medical decision making (see chart for details).     MDM: 1. Injury of left hand, initial encounter-hand x-ray results revealed above. Advised patient of left hand x-ray results with hardcopy provided.  Advised patient to RICE left hand for 30 minutes 3 times daily for the next 3 days.  If symptoms worsen and/or unresolved please follow-up with your PCP for further evaluation.  Patient discharged home, hemodynamically stable. Final Clinical Impressions(s) / UC Diagnoses   Final diagnoses:  Injury of left hand, initial encounter     Discharge Instructions      Advised patient of left hand x-ray results with hardcopy provided.  Advised patient to RICE left hand for 30 minutes 3 times daily for the next 3 days.  If symptoms worsen and/or unresolved please follow-up with your PCP for further evaluation.     ED Prescriptions   None    PDMP not reviewed this encounter.   Eliezer Lofts,  [place] 05/21/22 1821

## 2022-05-21 NOTE — Discharge Instructions (Addendum)
Advised patient of left hand x-ray results with hardcopy provided.  Advised patient to RICE left hand for 30 minutes 3 times daily for the next 3 days.  If symptoms worsen and/or unresolved please follow-up with your PCP for further evaluation.

## 2022-05-24 ENCOUNTER — Ambulatory Visit: Payer: Federal, State, Local not specified - PPO | Admitting: Podiatry

## 2022-05-29 ENCOUNTER — Ambulatory Visit: Payer: Federal, State, Local not specified - PPO | Attending: Sports Medicine

## 2022-05-29 DIAGNOSIS — M25612 Stiffness of left shoulder, not elsewhere classified: Secondary | ICD-10-CM | POA: Insufficient documentation

## 2022-05-29 DIAGNOSIS — M25512 Pain in left shoulder: Secondary | ICD-10-CM | POA: Diagnosis present

## 2022-05-29 DIAGNOSIS — M6281 Muscle weakness (generalized): Secondary | ICD-10-CM | POA: Diagnosis present

## 2022-05-29 NOTE — Therapy (Addendum)
OUTPATIENT PHYSICAL THERAPY SHOULDER TREATMENT AND DISCHARGE  PHYSICAL THERAPY DISCHARGE SUMMARY  Visits from Start of Care: 5  Current functional level related to goals / functional outcomes: See below   Remaining deficits: See below   Education / Equipment: See below   Patient agrees to discharge. Patient goals were met. Patient is being discharged due to meeting the stated rehab goals.   Patient Name: Rhonda Oneal MRN: [id number] DOB:Jun 02, 1965, 57 y.o.,, female Today's Date: 05/29/2022  END OF SESSION:  PT End of Session - 05/29/22 0906     Visit Number 5    Number of Visits 8    Date for PT Re-Evaluation 05/08/22    Authorization Type [employer]    PT Start Time [phone]   patient arrived late   PT Stop Time 0930    PT Time Calculation (min) 24 min    Activity Tolerance Patient tolerated treatment well    Behavior During Therapy [facility] for tasks assessed/performed              Past Medical History:  Diagnosis Date   Allergy to mold    Arthritis    Asthma    DERMATITIS, ATOPIC 02/09/2010   Qualifier: Diagnosis of  By: Orson Aloe MD, Tinnie Gens     EDEMA LEG 03/22/2010   Qualifier: Diagnosis of  By: Orson Aloe MD, Tinnie Gens     Fibromyalgia    IBS (irritable bowel syndrome)    Morbid obesity    Past Surgical History:  Procedure Laterality Date   CESAREAN SECTION     TONSILLECTOMY AND ADENOIDECTOMY     WISDOM TOOTH EXTRACTION     Patient Active Problem List   Diagnosis Date Noted   Impingement syndrome, shoulder, right 05/16/2022   Primary osteoarthritis of left shoulder 03/22/2022   Allergic rhinitis 03/14/2020   Fibromyalgia 03/14/2020   Sciatic nerve pain 03/14/2020   Trigger thumb, left thumb 04/02/2019   Degenerative spondylolisthesis 12/07/2016   Mild intermittent asthma with exacerbation 11/05/2016   Pain of left sacroiliac joint 04/18/2016   Trochanteric bursitis of both hips 03/16/2016   Ischial bursitis 12/15/2015   Iron deficiency anemia  secondary to blood loss (chronic) 01/20/2013   Sickle cell trait 01/20/2013   Synovitis of foot 12/26/2012   Arthralgia 12/08/2012   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   Cervical spinal stenosis 11/12/2012   Brachial neuritis 11/12/2012   Left lumbar radiculopathy 05/13/2012   Irritable bowel syndrome 11/29/2011   Morbid obesity    ALLERGIC RHINITIS, SEASONAL 06/08/2010   EDEMA LEG 03/22/2010   DERMATITIS, ATOPIC 02/09/2010   Vitamin D deficiency 04/15/2008    PCP: Harvie Heck MD  REFERRING PROVIDER: Monica Becton, MD  REFERRING DIAG: M19.012 (ICD-10-CM) - Primary osteoarthritis of left shoulder  THERAPY DIAG:  Left shoulder pain, unspecified chronicity  Stiffness of left shoulder, not elsewhere classified  Muscle weakness (generalized)  Rationale for Evaluation and Treatment: Rehabilitation  ONSET DATE: Beginning of January 2024  SUBJECTIVE:  SUBJECTIVE STATEMENT: Patient reports shoulders have been feeling better, however patient continued to have flare-ups with fibromyalgia. Patient states she has been getting migraines and had an MRI of neck and head with neurologist.   PERTINENT HISTORY: Fibromyalgia  PAIN:  Are you having pain? Yes: NPRS scale: 2/10 Pain location: top of left shoulder Pain description: dull Aggravating factors: lifting Relieving factors: heat, medication  PRECAUTIONS: None  WEIGHT BEARING RESTRICTIONS: No  FALLS:  Has patient fallen in last 6 months? Yes. Number of falls 1  LIVING ENVIRONMENT: Lives with: lives with their family 2 daughters and husband Lives in: House/apartment Stairs: No Has following equipment at home: None  OCCUPATION: Retired  PLOF: Independent  PATIENT GOALS:Preventative management of shoulder  NEXT MD  VISIT:   OBJECTIVE:   DIAGNOSTIC FINDINGS:  Shoulder x-ray 02/21/22: Negative  PATIENT SURVEYS:  FOTO 51; predicted 67  COGNITION: Overall cognitive status: Within functional limits for tasks assessed     SENSATION: WFL  POSTURE: Anterior pelvic tilt  UPPER EXTREMITY ROM:   Active ROM Right eval Left eval  Shoulder flexion 158 135*  Shoulder extension 80 80  Shoulder abduction 160 140*  Shoulder adduction    Shoulder internal rotation Btwn shoulder blades Btwn shoulder blades *   Shoulder external rotation T7 T5  Elbow flexion    Elbow extension    Wrist flexion    Wrist extension    Wrist ulnar deviation    Wrist radial deviation    Wrist pronation    Wrist supination    (Blank rows = not tested) * = pain  UPPER EXTREMITY MMT:  MMT Right eval Left eval Right  05/29/22 Left  05/29/22  Shoulder flexion 5 4- 5 5  Shoulder extension 4- 3+ 4+ 4+  Shoulder abduction 5 4- 5 5  Shoulder adduction      Shoulder internal rotation 5 4+ 5 5  Shoulder external rotation 5 4- 5 5  Middle trapezius      Lower trapezius      Elbow flexion      Elbow extension      Wrist flexion      Wrist extension      Wrist ulnar deviation      Wrist radial deviation      Wrist pronation      Wrist supination      Grip strength (lbs) 50, 53, 55 50, 55, 50    (Blank rows = not tested)  SHOULDER SPECIAL TESTS: Impingement tests: Neer impingement test: positive  and Hawkins/Kennedy impingement test: positive   JOINT MOBILITY TESTING:  WFL  PALPATION:  TTP L pec major, minor, lat, UT   TODAY'S TREATMENT:   [facility] Adult PT Treatment:                                                DATE: 05/29/2022 Therapeutic Activity: Subjective intake, LTG review, FOTO intake, review of new HEP with visual demo   [facility] Adult PT Treatment:                                                DATE: 05/01/22 Therapeutic Exercise: Seated pulleys flexion x1 min, scaption x 1 min  Doorway pec stretch  mid  and high x 30 sec Standing against pool noodle Scap squeeze hands by side x10 W red TB 10 x 3 sec, green TB x10 Shoulder abd with scap squeeze x5 Standing Low trap setting on wall x10 Bow and arrow red TB 2x10 Shoulder ext red TB 2x10                     PATIENT EDUCATION: Education details: Updated HEP Person educated: Patient Education method: Explanation, Demonstration, and Handouts Education comprehension: verbalized understanding, returned demonstration, and needs further education  HOME EXERCISE PROGRAM: Access Code: [email or web address] URL: [email or web address] Date: 05/29/2022 Prepared by: Carlynn HeraldKathryn Jalyric Kaestner  Exercises - Doorway Pec Stretch at 60 Elevation  - 1 x daily - 7 x weekly - 2 sets - 30 sec hold - Doorway Pec Stretch at 90 Degrees Abduction  - 1 x daily - 7 x weekly - 2 sets - 30 sec hold - Doorway Pec Stretch at 120 Degrees Abduction  - 1 x daily - 7 x weekly - 2 sets - 30 sec hold - Shoulder External Rotation and Scapular Retraction with Resistance  - 1 x daily - 7 x weekly - 2 sets - 10 reps - Shoulder extension with resistance - Neutral  - 1 x daily - 7 x weekly - 2 sets - 10 reps - Shoulder W - External Rotation with Resistance  - 1 x daily - 7 x weekly - 2 sets - 10 reps - Drawing Bow  - 1 x daily - 7 x weekly - 2 sets - 10 reps - Low Trap Setting at [employer]  - 1 x daily - 7 x weekly - 1 sets - 10 reps - Standing Bilateral Low Shoulder Row with Anchored Resistance  - 1 x daily - 7 x weekly - 3 sets - 10 reps - Shoulder Flexion Serratus Activation with Resistance  - 1 x daily - 7 x weekly - 3 sets - 10 reps - Standing Plank on Wall with Reaches and Resistance  - 1 x daily - 7 x weekly - 3 sets - 10 reps - Prone W Scapular Retraction  - 1 x daily - 7 x weekly - 3 sets - 10 reps  ASSESSMENT:  CLINICAL IMPRESSION:  Patient has met all goals as outline in POC. Good progression demonstrated with shoulder strength as measured by MMT; patient demonstrated good  shoulder ROM with no pain in all directions. Patient able to lift 5# overhead with no increase in pain or restriction.    OBJECTIVE IMPAIRMENTS: decreased ROM, decreased strength, increased fascial restrictions, increased muscle spasms, impaired UE functional use, and pain.   ACTIVITY LIMITATIONS: carrying, lifting, and reach over head  PARTICIPATION LIMITATIONS: cleaning and community activity  PERSONAL FACTORS: Past/current experiences and Time since onset of injury/illness/exacerbation are also affecting patient's functional outcome.   REHAB POTENTIAL: Good  CLINICAL DECISION MAKING: Stable/uncomplicated  EVALUATION COMPLEXITY: Low   GOALS: Goals reviewed with patient? Yes  SHORT TERM GOALS: Target date: 04/24/2022  Pt will be ind with initial HEP Baseline: Goal status: MET   LONG TERM GOALS: Target date: 05/08/2022  Pt will demo L = R shoulder ROM without pain Baseline:  Goal status: MET  2.  Pt will be able to lift and carry at least 5# overhead to reach in/out of high kitchen cabinets Baseline:  Goal status: MET  3.  Pt will have improved FOTO to 67 Baseline:  Goal status: MET (76)   PLAN:  PT  FREQUENCY: 1x/week  PT DURATION: 6 weeks  PLANNED INTERVENTIONS: Therapeutic exercises, Therapeutic activity, Neuromuscular re-education, Balance training, Gait training, Patient/Family education, Self Care, Joint mobilization, Dry Needling, Electrical stimulation, Cryotherapy, Moist heat, Taping, Vasopneumatic device, Ionotophoresis 4mg /ml Dexamethasone, Manual therapy, and Re-evaluation  PLAN FOR NEXT SESSION: Discharge   Sanjuana Mae, PTA 05/29/2022, 9:30 AM

## 2022-06-22 ENCOUNTER — Other Ambulatory Visit: Payer: Self-pay | Admitting: Podiatry

## 2022-06-27 ENCOUNTER — Ambulatory Visit (INDEPENDENT_AMBULATORY_CARE_PROVIDER_SITE_OTHER): Payer: Federal, State, Local not specified - PPO | Admitting: Sports Medicine

## 2022-06-27 DIAGNOSIS — M7541 Impingement syndrome of right shoulder: Secondary | ICD-10-CM | POA: Diagnosis not present

## 2022-06-27 NOTE — Progress Notes (Signed)
    Procedures performed today:    None.  Independent interpretation of notes and tests performed by another provider:   None.  Brief History, Exam, Impression, and Recommendations:    Impingement syndrome, shoulder, right This pleasant 57 year old female returns, she has right-sided impingement syndrome, we did a subacromial injection and home PT, she did have some cuff tearing on the ultrasound. She returns today 6 weeks after the injection completely pain-free, she will continue home conditioning and return to see me as needed.    ____________________________________________ Ihor Austin. Benjamin Stain, M.D., ABFM., CAQSM., AME. Primary Care and Sports Medicine [facility]  Adjunct Professor of Family Medicine  [employer]  Restaurant manager, fast food

## 2022-06-27 NOTE — Assessment & Plan Note (Signed)
This pleasant 57 year old female returns, she has right-sided impingement syndrome, we did a subacromial injection and home PT, she did have some cuff tearing on the ultrasound. She returns today 6 weeks after the injection completely pain-free, she will continue home conditioning and return to see me as needed.

## 2022-09-05 IMAGING — DX DG ANKLE COMPLETE 3+V*L*
3 series · 3 of 3 positions shown · non-contrast
Comparison: None.

CLINICAL DATA: Pain and tenderness, left lateral ankle

EXAM:
LEFT ANKLE COMPLETE - 3+ VIEW

[ankle ap (1 of 2)]
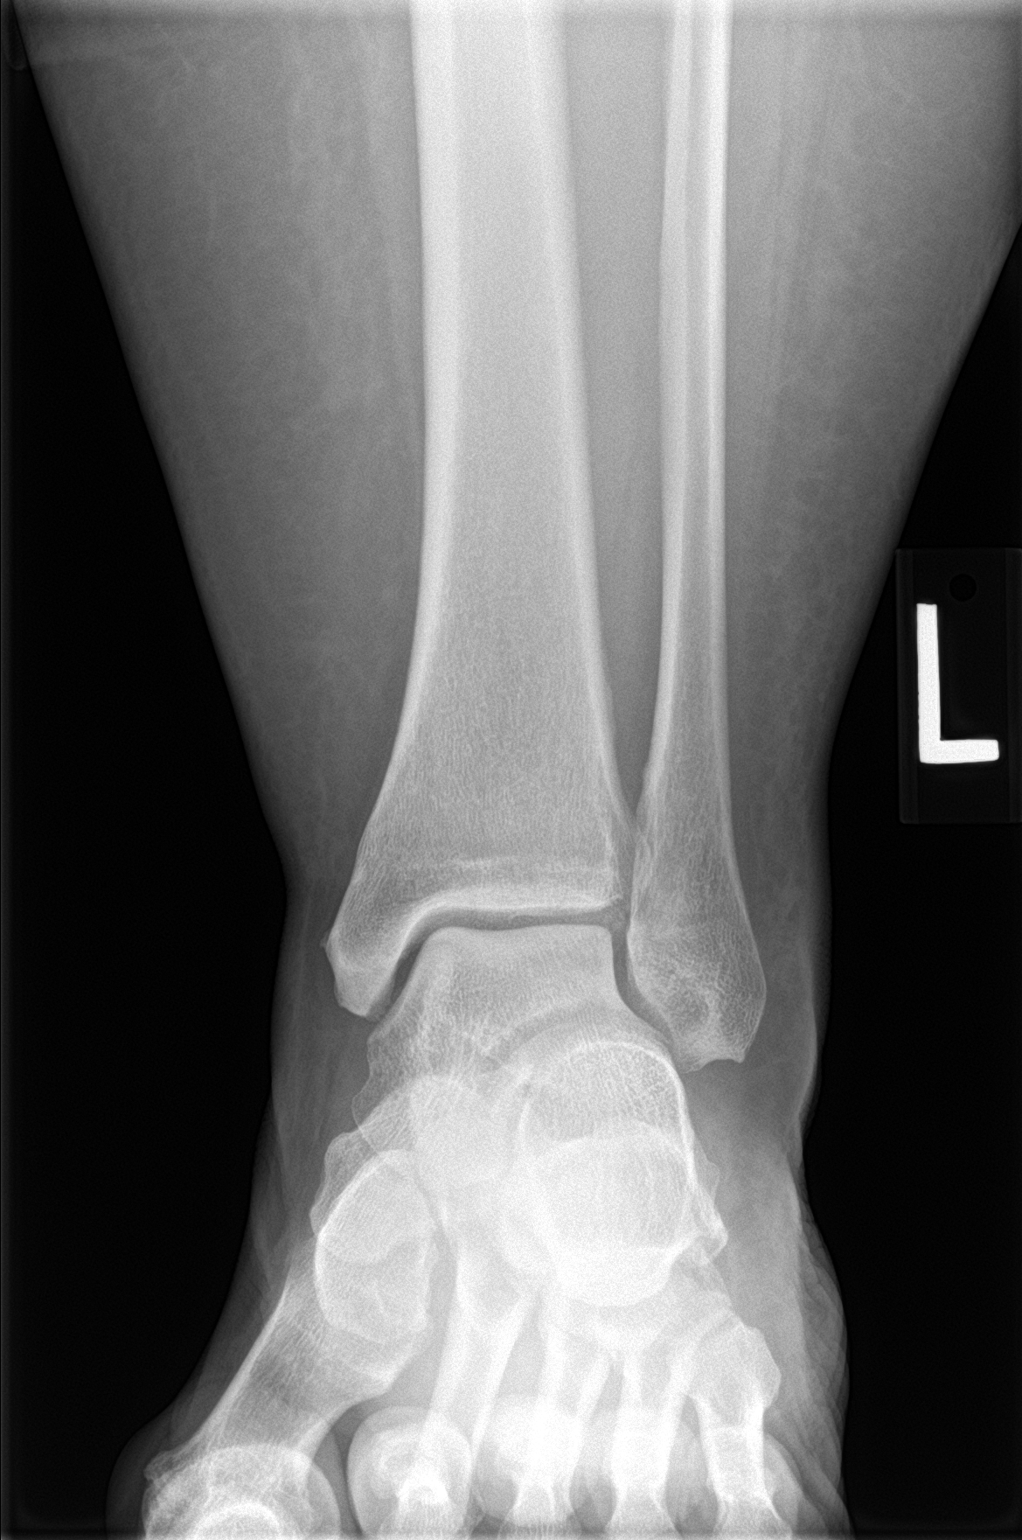

[ankle lat]
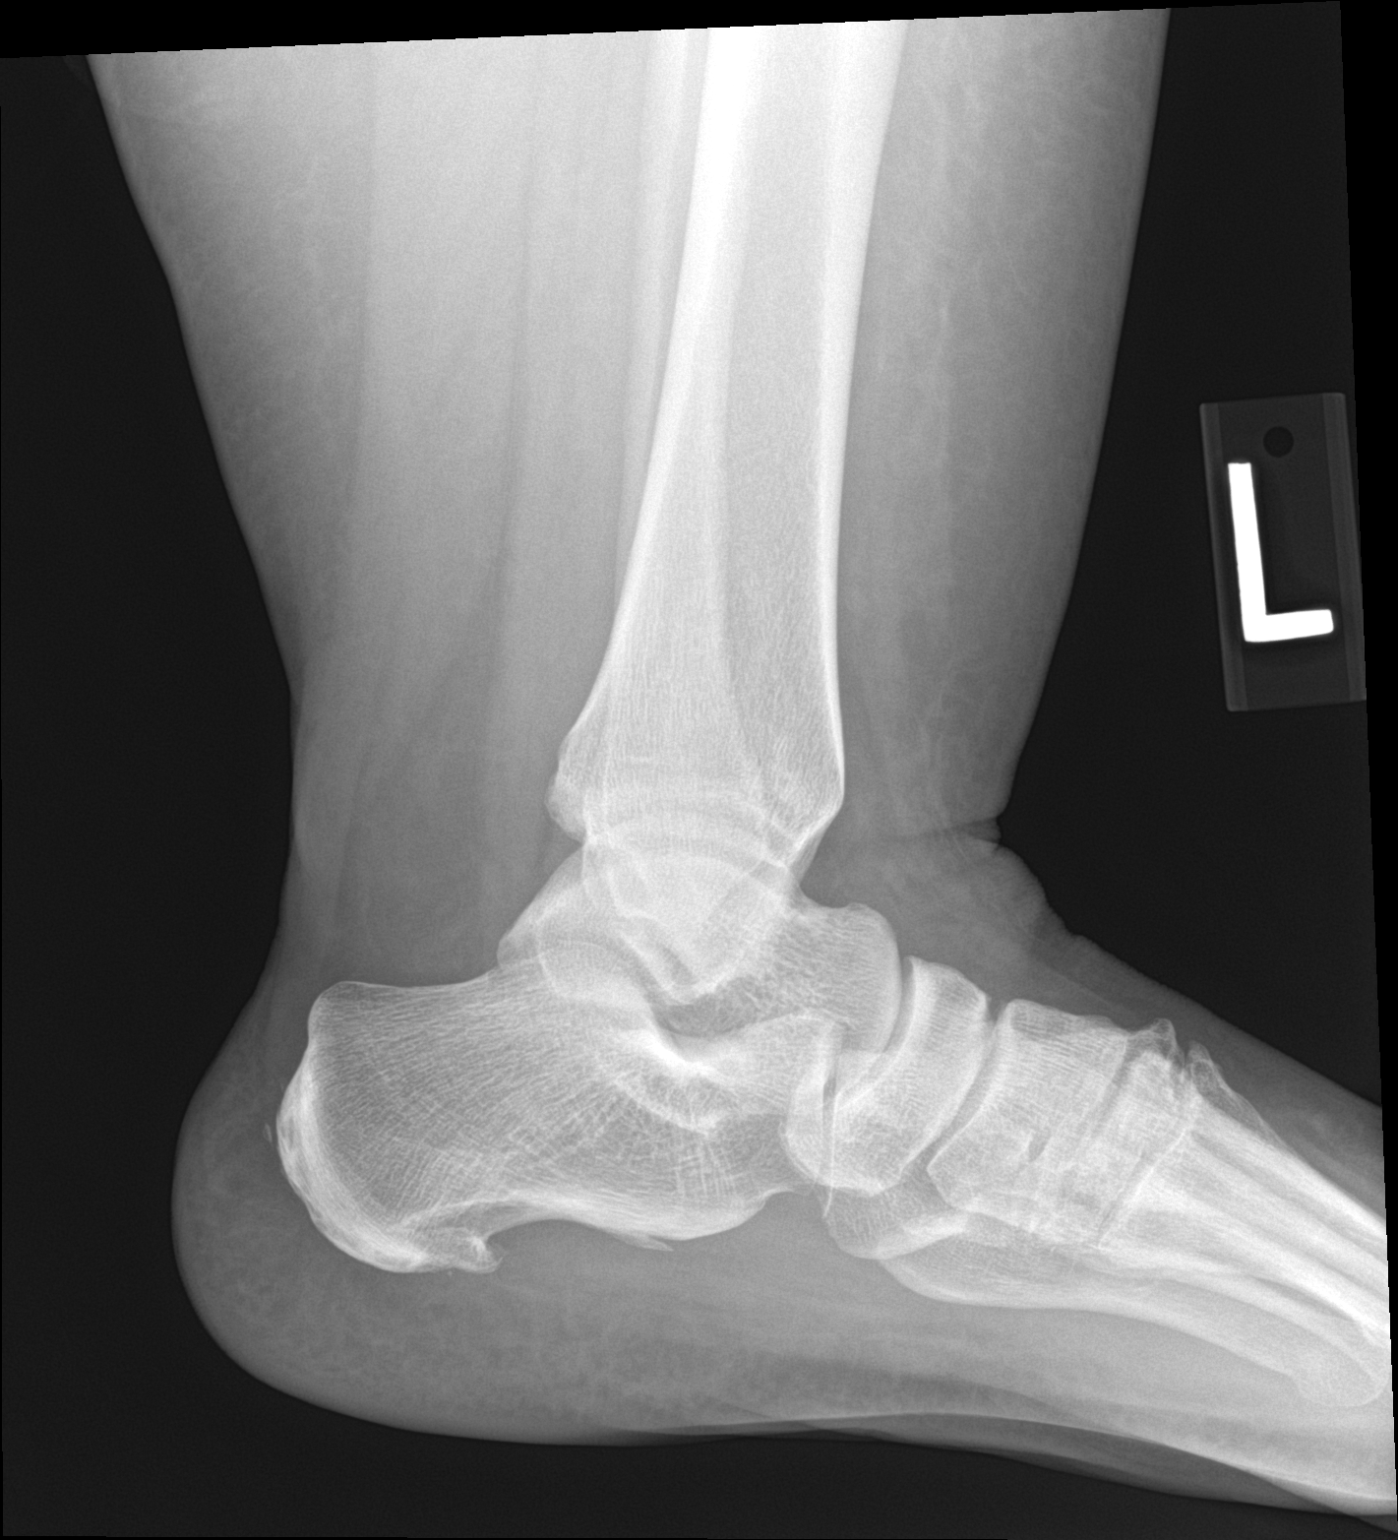

[ankle ap (2 of 2)]
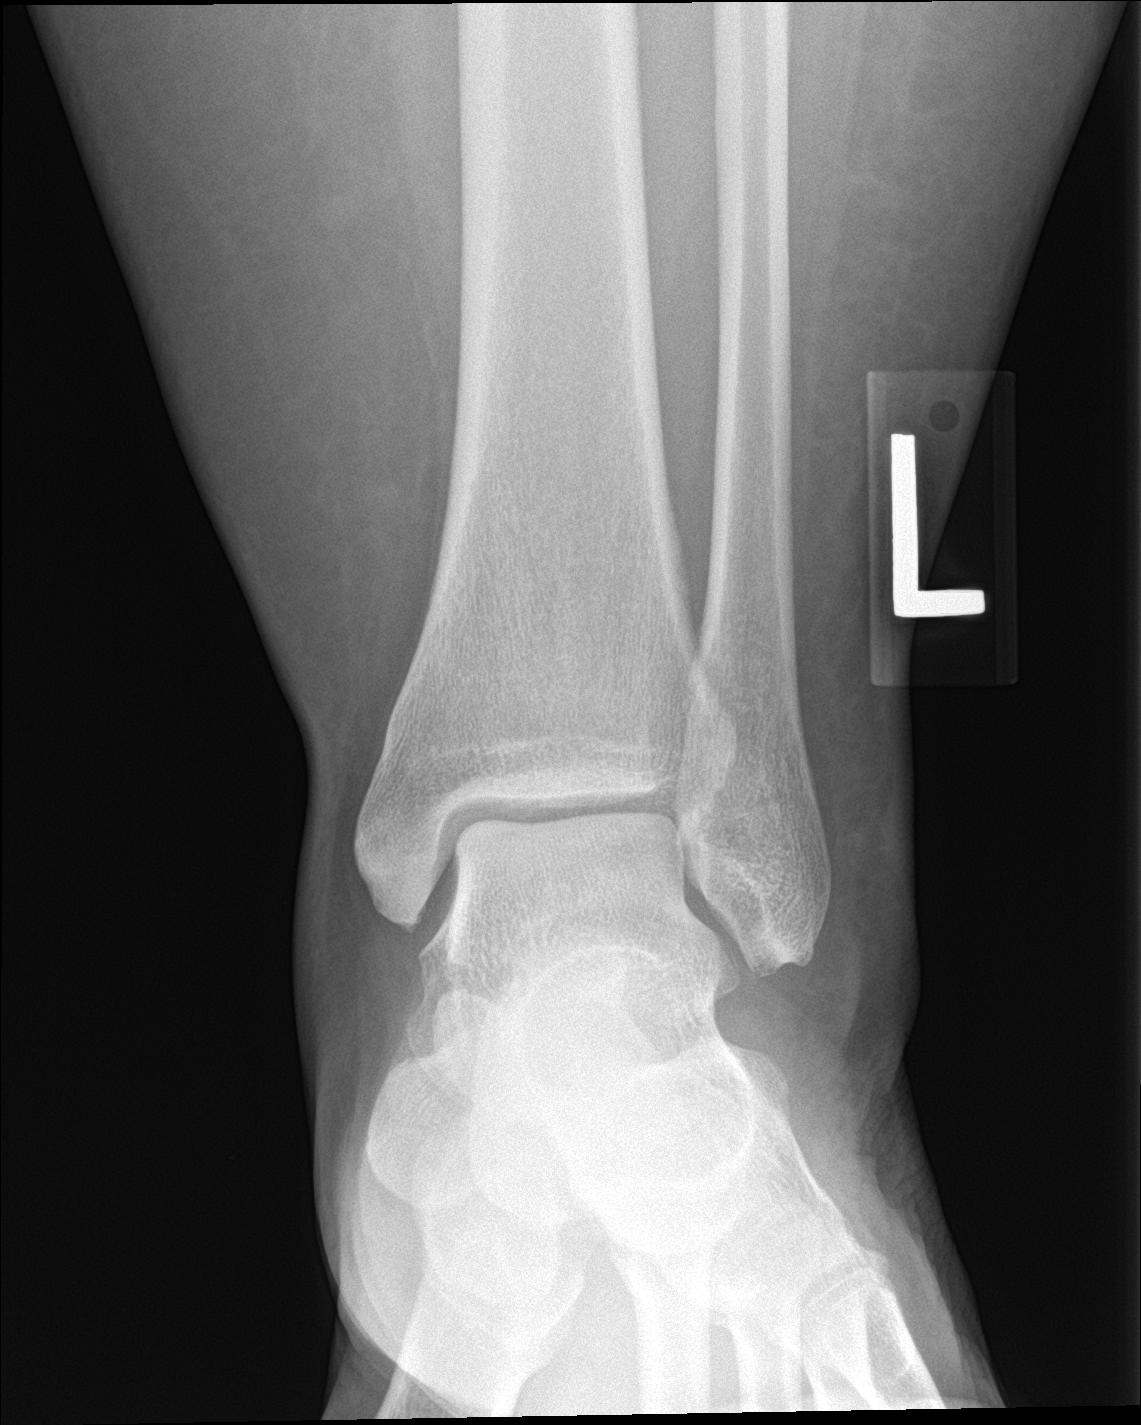

[3 of 3 positions shown; findings below may reference images not displayed]

FINDINGS: There is no evidence of fracture, dislocation, or joint effusion.
There is no evidence of arthropathy or other focal bone abnormality.
Diffuse soft tissue edema about the ankle.
IMPRESSION: No fracture or dislocation of the left ankle. Diffuse soft tissue
edema about the ankle.

## 2022-09-29 ENCOUNTER — Ambulatory Visit
Admission: EM | Admit: 2022-09-29 | Discharge: 2022-09-29 | Disposition: A | Discharge status: Home / Self Care | Payer: Federal, State, Local not specified - PPO | Source: Home / Self Care

## 2022-09-29 ENCOUNTER — Encounter: Payer: Self-pay | Admitting: Emergency Medicine

## 2022-09-29 ENCOUNTER — Other Ambulatory Visit: Payer: Self-pay

## 2022-09-29 DIAGNOSIS — N3000 Acute cystitis without hematuria: Secondary | ICD-10-CM | POA: Diagnosis not present

## 2022-09-29 DIAGNOSIS — R300 Dysuria: Secondary | ICD-10-CM | POA: Diagnosis present

## 2022-09-29 DIAGNOSIS — J45909 Unspecified asthma, uncomplicated: Secondary | ICD-10-CM | POA: Insufficient documentation

## 2022-09-29 DIAGNOSIS — Z6839 Body mass index (BMI) 39.0-39.9, adult: Secondary | ICD-10-CM | POA: Diagnosis not present

## 2022-09-29 DIAGNOSIS — J3489 Other specified disorders of nose and nasal sinuses: Secondary | ICD-10-CM | POA: Diagnosis not present

## 2022-09-29 DIAGNOSIS — J0100 Acute maxillary sinusitis, unspecified: Secondary | ICD-10-CM

## 2022-09-29 DIAGNOSIS — R519 Headache, unspecified: Secondary | ICD-10-CM | POA: Diagnosis present

## 2022-09-29 DIAGNOSIS — R0981 Nasal congestion: Secondary | ICD-10-CM | POA: Diagnosis present

## 2022-09-29 LAB — POCT URINALYSIS DIP (MANUAL ENTRY)
Bilirubin, UA: NEGATIVE
Blood, UA: NEGATIVE
Glucose, UA: NEGATIVE mg/dL
Ketones, POC UA: NEGATIVE mg/dL
Nitrite, UA: NEGATIVE
Protein Ur, POC: NEGATIVE mg/dL
Spec Grav, UA: >=1.03 — AB (ref 1.010–1.025)
Urobilinogen, UA: 0.2 E.U./dL
pH, UA: 5.5 (ref 5.0–8.0)

## 2022-09-29 MED ORDER — CEFDINIR 300 MG PO CAPS: 300.0000 mg | ORAL_CAPSULE | Freq: Two times a day (BID) | ORAL | 0 refills | Status: AC

## 2022-09-29 MED ORDER — PREDNISONE 10 MG (21) PO TBPK: ORAL_TABLET | Freq: Every day | ORAL | 0 refills | Status: DC

## 2022-09-29 NOTE — Discharge Instructions (Addendum)
Patient notified of UTI as well as dehydration via urinalysis.  Instructed patient to discontinue current Macrobid.  Advised patient to take medications as directed with food to completion.  Advised patient to take prednisone with first dose of Cefdinir for the next 7 to 10 days.  Encouraged increase daily water intake to 64 ounces per day 7 days/week.  Advised we will follow-up with urine culture results once received.  Advised if symptoms worsen and/or unresolved please follow-up with PCP or here for further evaluation.

## 2022-09-29 NOTE — ED Triage Notes (Signed)
Patient c/o facial and head congestion x 10 days.  Patient has had some headache, had some body chills.  In the past she had RSV and feels similar to those sx's.  Patient has taken Allegra and Flonase for her sx's.

## 2022-09-29 NOTE — ED Provider Notes (Signed)
[name]    CSN: [id number] Arrival date & time: 09/29/22  5784      History   Chief Complaint Chief Complaint  Patient presents with   Facial Congestion    HPI Rhonda Oneal is a 57 y.o. female.   HPI 57 year old female presents with facial pain and head congestion for 10 days.  Additionally, patient reports being treated recently with e-visit for dysuria and prescribed Macrobid.  Patient request urinalysis and urine culture with this visit today as well.  Reports taking OTC Allegra and Flonase for her symptoms.  PMH significant for morbid obesity, asthma, and fibromyalgia.  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   Allergic rhinitis 03/14/2020   Fibromyalgia 03/14/2020   Sciatic nerve pain 03/14/2020   Trigger thumb, left thumb 04/02/2019   Degenerative spondylolisthesis 12/07/2016   Mild intermittent asthma with exacerbation 11/05/2016   Pain of left sacroiliac joint 04/18/2016   Trochanteric bursitis of both hips 03/16/2016   Ischial bursitis 12/15/2015   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   Sickle cell trait (HCC) 01/20/2013   Synovitis of foot 12/26/2012   Arthralgia 12/08/2012   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   Cervical spinal stenosis 11/12/2012   Brachial neuritis 11/12/2012   Left lumbar radiculopathy 05/13/2012   Irritable bowel syndrome 11/29/2011   Morbid obesity (HCC)    ALLERGIC RHINITIS, SEASONAL 06/08/2010   EDEMA LEG 03/22/2010   DERMATITIS,  ATOPIC 02/09/2010   Vitamin D deficiency 04/15/2008    Past Surgical History:  Procedure Laterality Date   CESAREAN SECTION     TONSILLECTOMY AND ADENOIDECTOMY     WISDOM TOOTH EXTRACTION      OB History     Gravida  2   Para  2   Term      Preterm      AB      Living         SAB      IAB      Ectopic      Multiple      Live Births               Home Medications    Prior to Admission medications   Medication Sig Start Date End Date Taking? Authorizing Provider  cefdinir (OMNICEF) 300 MG capsule Take 1 capsule (300 mg total) by mouth 2 (two) times daily for 7 days. 09/29/22 10/06/22 Yes Trevor Iha, FNP  dexlansoprazole (DEXILANT) 60 MG capsule Take 60 mg by mouth daily.   Yes [provider]  diclofenac Sodium (VOLTAREN) 1 % GEL APPLY TO AFFECTED AREA 3 TIMES A DAY 12/14/19  Yes [provider]  HYDROcodone-acetaminophen (NORCO/VICODIN) 5-325 MG tablet Take 1 tablet by mouth every 6 (six) hours as needed for moderate pain.   Yes [provider]  ibuprofen (ADVIL) 800 MG tablet  01/07/19  Yes [provider]  lidocaine (LIDODERM) 5 % PLACE  1 PATCH ON THE SKIN DAILY FOR 30 DAY REMOVE & DISCARD PATCH WITHIN 12 HRS OR AS DIRECTED BY MD 02/15/20  Yes [provider]  methocarbamol (ROBAXIN) 750 MG tablet Take 750 mg by mouth 4 (four) times daily.   Yes [provider]  pravastatin (PRAVACHOL) 20 MG tablet Take 20 mg by mouth at bedtime. 03/19/19  Yes [provider]  predniSONE (STERAPRED UNI-PAK 21 TAB) 10 MG (21) TBPK tablet Take by mouth daily. Take 6 tabs by mouth daily  for 2 days, then 5 tabs for 2 days, then 4 tabs for 2 days, then 3 tabs for 2 days, 2 tabs for 2 days, then 1 tab by mouth daily for 2 days 09/29/22  Yes Trevor Iha, FNP  Semaglutide (OZEMPIC, 0.25 OR 0.5 MG/DOSE, [place]) Inject into the skin.   Yes [provider]  losartan-hydrochlorothiazide [name]) 50-12.5 MG tablet  Take by mouth. 12/03/19 12/02/20  [provider]    Family History Family History  Problem Relation Age of Onset   Diabetes Mother    Cancer Mother        Lung   Hyperlipidemia Father    Breast cancer Maternal Aunt    Ovarian cancer Paternal Aunt     Social History Social History   Tobacco Use   Smoking status: Never   Smokeless tobacco: Never  Vaping Use   Vaping status: Never Used  Substance Use Topics   Alcohol use: No   Drug use: No     Allergies   Pregabalin, Cymbalta [duloxetine hcl], Doxycycline, and Ferrous sulfate   Review of Systems Review of Systems  HENT:  Positive for congestion, sinus pressure and sinus pain.   All other systems reviewed and are negative.    Physical Exam Triage Vital Signs ED Triage Vitals  Encounter Vitals Group     BP 09/29/22 0946 (!) 140/82     Systolic BP Percentile --      Diastolic BP Percentile --      Pulse Rate 09/29/22 0946 85     Resp 09/29/22 0946 16     Temp 09/29/22 0946 98.5 F (36.9 C)     Temp Source 09/29/22 0946 Oral     SpO2 09/29/22 0946 100 %     Weight 09/29/22 0947 207 lb (93.9 kg)     Height 09/29/22 0947 5\' 1"  (1.549 m)     Head Circumference --      Peak Flow --      Pain Score 09/29/22 0947 8     Pain Loc --      Pain Education --      Exclude from Growth Chart --    No data found.  Updated Vital Signs BP (!) 140/82 (BP Location: Right Arm)   Pulse 85   Temp 98.5 F (36.9 C) (Oral)   Resp 16   Ht 5\' 1"  (1.549 m)   Wt 207 lb (93.9 kg)   SpO2 100%   BMI 39.11 kg/m    Physical Exam Vitals and nursing note reviewed.  Constitutional:      General: She is not in acute distress.    Appearance: Normal appearance. She is obese. She is not ill-appearing.  HENT:     Head: Normocephalic and atraumatic.     Right Ear: Tympanic membrane and external ear normal.     Left Ear: Tympanic membrane and external ear normal.     Ears:     Comments: Significant eustachian tube  dysfunction noted bilaterally    Nose:     Right Sinus: Maxillary sinus tenderness and frontal sinus tenderness present.     Left Sinus: Maxillary sinus tenderness and frontal sinus tenderness present.     Comments: Turbinates are erythematous/edematous    Mouth/Throat:     Mouth: Mucous membranes are moist.     Pharynx: Oropharynx is clear.  Eyes:     Extraocular Movements: Extraocular movements intact.     Conjunctiva/sclera: Conjunctivae normal.     Pupils: Pupils are equal, round, and reactive to light.  Cardiovascular:     Rate and Rhythm: Normal rate and regular rhythm.     Pulses: Normal pulses.     Heart sounds: Normal heart sounds.  Pulmonary:     Effort: Pulmonary effort is normal.     Breath sounds: Normal breath sounds. No wheezing, rhonchi or rales.  Musculoskeletal:        General: Normal range of motion.     Cervical back: Normal range of motion and neck supple.  Skin:    General: Skin is warm and dry.  Neurological:     General: No focal deficit present.     Mental Status: She is alert and oriented to person, place, and time. Mental status is at baseline.  Psychiatric:        Mood and Affect: Mood normal.        Behavior: Behavior normal.        Thought Content: Thought content normal.      UC Treatments / Results  Labs (all labs ordered are listed, but only abnormal results are displayed) Labs Reviewed  POCT URINALYSIS DIP (MANUAL ENTRY) - Abnormal; Notable for the following components:      Result Value   Spec Grav, UA >=1.030 (*)    Leukocytes, UA Trace (*)    All other components within normal limits  URINE CULTURE    EKG   Radiology No results found.  Procedures Procedures (including critical care time)  Medications Ordered in UC Medications - No data to display  Initial Impression / Assessment and Plan / UC Course  I have reviewed the triage vital signs and the nursing notes.  Pertinent labs & imaging results that were available  during my care of the patient were reviewed by me and considered in my medical decision making (see chart for details).     MDM: 1.  Acute maxillary sinusitis, recurrence not specified-Rx'd Cefdinir 300 mg capsule twice daily x 7 days; 2.  Acute cystitis without hematuria-UA revealed above, urine culture ordered Rx'd Cefdinir 300 mg capsule twice daily x 7 days; 3.  Sinus pressure-Rx Sterapred Unipak (tapering from 60 mg to 10 mg over the next 10 days).  Instructed patient to discontinue current Macrobid.  Patient notified of UTI as well as dehydration via urinalysis.  Advised patient to take medications as directed with food to completion.  Advised patient to take prednisone with first dose of cefdinir for the next 7 to 10 days.  Encouraged increase daily water intake to 64 ounces per day 7 days/week.  Advised we will follow-up with urine culture results once received.  Advised if symptoms worsen and/or unresolved please follow-up with PCP or here for further evaluation.  Final Clinical Impressions(s) / UC Diagnoses   Final diagnoses:  Acute maxillary sinusitis, recurrence not specified  Acute cystitis without hematuria  Sinus pressure     Discharge Instructions      Patient notified of UTI as well as  dehydration via urinalysis.  Instructed patient to discontinue current Macrobid.  Advised patient to take medications as directed with food to completion.  Advised patient to take prednisone with first dose of Cefdinir for the next 7 to 10 days.  Encouraged increase daily water intake to 64 ounces per day 7 days/week.  Advised we will follow-up with urine culture results once received.  Advised if symptoms worsen and/or unresolved please follow-up with PCP or here for further evaluation.     ED Prescriptions     Medication Sig Dispense Auth. Provider   cefdinir (OMNICEF) 300 MG capsule Take 1 capsule (300 mg total) by mouth 2 (two) times daily for 7 days. 14 capsule Trevor Iha, FNP    predniSONE (STERAPRED UNI-PAK 21 TAB) 10 MG (21) TBPK tablet Take by mouth daily. Take 6 tabs by mouth daily  for 2 days, then 5 tabs for 2 days, then 4 tabs for 2 days, then 3 tabs for 2 days, 2 tabs for 2 days, then 1 tab by mouth daily for 2 days 42 tablet Trevor Iha, FNP      PDMP not reviewed this encounter.   Trevor Iha, FNP 09/29/22 1119

## 2022-09-30 LAB — URINE CULTURE: Culture: NO GROWTH

## 2022-10-31 ENCOUNTER — Encounter: Payer: Self-pay | Admitting: Emergency Medicine

## 2022-10-31 ENCOUNTER — Ambulatory Visit
Admission: EM | Admit: 2022-10-31 | Discharge: 2022-10-31 | Disposition: A | Discharge status: Home / Self Care | Payer: Federal, State, Local not specified - PPO | Attending: Family Medicine | Admitting: Family Medicine

## 2022-10-31 DIAGNOSIS — M19011 Primary osteoarthritis, right shoulder: Secondary | ICD-10-CM | POA: Diagnosis not present

## 2022-10-31 DIAGNOSIS — M25511 Pain in right shoulder: Secondary | ICD-10-CM | POA: Diagnosis not present

## 2022-10-31 MED ORDER — CELECOXIB 200 MG PO CAPS: 200.0000 mg | ORAL_CAPSULE | Freq: Every day | ORAL | 0 refills | Status: DC

## 2022-10-31 NOTE — Discharge Instructions (Addendum)
Instructed patient to hold Voltaren gel while taking Celebrex.  Advised patient to take medication as directed with food to completion.  Encouraged increase daily water intake to 64 ounces per day while taking this medication.  Advised if symptoms worsen and/or unresolved please follow-up with PCP or here for further evaluation.

## 2022-10-31 NOTE — ED Provider Notes (Signed)
[facility]    CSN: [id number] Arrival date & time: 10/31/22  0912      History   Chief Complaint Chief Complaint  Patient presents with   Shoulder Pain    HPI Rhonda Oneal [name] is a 57 y.o. female.   HPI 57 year old female presents with right shoulder pain for 4 days.  Denies injury or insult to right shoulder.  Patient reports has fibromyalgia and has tried Tramadol and muscle relaxer.  PMH significant for morbid obesity, fibromyalgia, and IBS.  Right shoulder x-ray results of 05/17/2022 revealed mild degenerative changes of the right shoulder.  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   Allergic rhinitis 03/14/2020   Fibromyalgia 03/14/2020   Sciatic nerve pain 03/14/2020   Trigger thumb, left thumb 04/02/2019   Degenerative spondylolisthesis 12/07/2016   Mild intermittent asthma with exacerbation 11/05/2016   Pain of left sacroiliac joint 04/18/2016   Trochanteric bursitis of both hips 03/16/2016   Ischial bursitis 12/15/2015   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   Sickle cell trait (HCC) 01/20/2013   Synovitis of foot 12/26/2012   Arthralgia 12/08/2012   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   Cervical spinal stenosis 11/12/2012   Brachial neuritis 11/12/2012   Left lumbar radiculopathy 05/13/2012   Irritable bowel syndrome 11/29/2011   Morbid obesity (HCC)    ALLERGIC RHINITIS, SEASONAL 06/08/2010   EDEMA LEG 03/22/2010   DERMATITIS, ATOPIC 02/09/2010   Vitamin D  deficiency 04/15/2008    Past Surgical History:  Procedure Laterality Date   CESAREAN SECTION     TONSILLECTOMY AND ADENOIDECTOMY     WISDOM TOOTH EXTRACTION      OB History     Gravida  2   Para  2   Term      Preterm      AB      Living         SAB      IAB      Ectopic      Multiple      Live Births               Home Medications    Prior to Admission medications   Medication Sig Start Date End Date Taking? Authorizing Provider  celecoxib (CELEBREX) 200 MG capsule Take 1 capsule (200 mg total) by mouth daily for 15 days. 10/31/22 11/15/22 Yes Trevor Iha, FNP  dexlansoprazole (DEXILANT) 60 MG capsule Take 60 mg by mouth daily.    [provider]  diclofenac Sodium (VOLTAREN) 1 % GEL APPLY TO AFFECTED AREA 3 TIMES A DAY 12/14/19   [provider]  HYDROcodone-acetaminophen (NORCO/VICODIN) 5-325 MG tablet Take 1 tablet by mouth every 6 (six) hours as needed for moderate pain.    [provider]  ibuprofen (ADVIL) 800 MG tablet  01/07/19   [provider]  lidocaine (LIDODERM) 5 % PLACE 1 PATCH ON THE SKIN DAILY FOR  30 DAY REMOVE & DISCARD PATCH WITHIN 12 HRS OR AS DIRECTED BY MD 02/15/20   [provider]  losartan-hydrochlorothiazide [name]) 50-12.5 MG tablet Take by mouth. 12/03/19 12/02/20  [provider]  methocarbamol (ROBAXIN) 750 MG tablet Take 750 mg by mouth 4 (four) times daily.    [provider]  pravastatin (PRAVACHOL) 20 MG tablet Take 20 mg by mouth at bedtime. 03/19/19   [provider]  Semaglutide (OZEMPIC, 0.25 OR 0.5 MG/DOSE, [place]) Inject into the skin.    [provider]    Family History Family History  Problem Relation Age of Onset   Diabetes Mother    Cancer Mother        Lung   Hyperlipidemia Father    Breast cancer Maternal Aunt    Ovarian cancer Paternal Aunt     Social History Social History   Tobacco Use   Smoking status: Never    Smokeless tobacco: Never  Vaping Use   Vaping status: Never Used  Substance Use Topics   Alcohol use: No   Drug use: No     Allergies   Pregabalin, Cymbalta [duloxetine hcl], Doxycycline, and Ferrous sulfate   Review of Systems Review of Systems  Musculoskeletal:        Right shoulder pain for 4 days secondary to fibromyalgia.  All other systems reviewed and are negative.    Physical Exam Triage Vital Signs ED Triage Vitals  Encounter Vitals Group     BP 10/31/22 0930 (!) 144/88     Systolic BP Percentile --      Diastolic BP Percentile --      Pulse Rate 10/31/22 0930 82     Resp 10/31/22 0930 18     Temp 10/31/22 0930 98 F (36.7 C)     Temp Source 10/31/22 0930 Oral     SpO2 10/31/22 0930 99 %     Weight --      Height --      Head Circumference --      Peak Flow --      Pain Score 10/31/22 0931 8     Pain Loc --      Pain Education --      Exclude from Growth Chart --    No data found.  Updated Vital Signs BP (!) 144/88 (BP Location: Right Arm)   Pulse 82   Temp 98 F (36.7 C) (Oral)   Resp 18   SpO2 99%     Physical Exam Vitals and nursing note reviewed.  Constitutional:      Appearance: Normal appearance. She is obese.  HENT:     Head: Normocephalic and atraumatic.     Mouth/Throat:     Mouth: Mucous membranes are moist.     Pharynx: Oropharynx is clear.  Eyes:     Extraocular Movements: Extraocular movements intact.     Conjunctiva/sclera: Conjunctivae normal.     Pupils: Pupils are equal, round, and reactive to light.  Cardiovascular:     Rate and Rhythm: Normal rate and regular rhythm.     Pulses: Normal pulses.     Heart sounds: Normal heart sounds.  Pulmonary:     Effort: Pulmonary effort is normal.     Breath sounds: Normal breath sounds. No wheezing, rhonchi or rales.  Musculoskeletal:        General: Normal range of motion.     Cervical back: Normal range of motion and neck supple.     Comments: Right  shoulder pain: Limited  range of motion due to pain today  Skin:    General: Skin is warm and dry.  Neurological:     General: No focal deficit present.     Mental Status: She is alert and oriented to person, place, and time. Mental status is at baseline.  Psychiatric:        Mood and Affect: Mood normal.        Behavior: Behavior normal.      UC Treatments / Results  Labs (all labs ordered are listed, but only abnormal results are displayed) Labs Reviewed - No data to display  EKG   Radiology No results found.  Procedures Procedures (including critical care time)  Medications Ordered in UC Medications - No data to display  Initial Impression / Assessment and Plan / UC Course  I have reviewed the triage vital signs and the nursing notes.  Pertinent labs & imaging results that were available during my care of the patient were reviewed by me and considered in my medical decision making (see chart for details).     MDM: 1.  Acute pain of right shoulder-Rx'd Celebrex 200 mg capsule once daily x 15 days; 2.  Osteoarthritis of right shoulder, unspecified osteoarthritis type-right shoulder x-ray of 05/17/2022 revealed mild osteoarthritis of right shoulder/GH/AC joints-Rx'd Celebrex 200 mg capsule once daily x 15 days. Instructed patient to hold Voltaren gel while taking Celebrex.  Advised patient to take medication as directed with food to completion.  Encouraged increase daily water intake to 64 ounces per day while taking this medication.  Advised if symptoms worsen and/or unresolved please follow-up with PCP or here for further evaluation.  Patient discharged home, hemodynamically stable. Final Clinical Impressions(s) / UC Diagnoses   Final diagnoses:  Acute pain of right shoulder  Osteoarthritis of right shoulder, unspecified osteoarthritis type     Discharge Instructions      Instructed patient to hold Voltaren gel while taking Celebrex.  Advised patient to take medication as directed with food  to completion.  Encouraged increase daily water intake to 64 ounces per day while taking this medication.  Advised if symptoms worsen and/or unresolved please follow-up with PCP or here for further evaluation.     ED Prescriptions     Medication Sig Dispense Auth. Provider   celecoxib (CELEBREX) 200 MG capsule Take 1 capsule (200 mg total) by mouth daily for 15 days. 15 capsule Trevor Iha, FNP      I have reviewed the PDMP during this encounter.   Trevor Iha, FNP 10/31/22 1040

## 2022-10-31 NOTE — ED Triage Notes (Signed)
Pt c/o right shoulder pain for the last 4 days that is constant denies known injury. States she has fibromyalgia and goes to the pain clinic. Has tried tramadol and muscle relaxer rx by clinic with no relief.

## 2022-11-09 ENCOUNTER — Ambulatory Visit: Payer: Federal, State, Local not specified - PPO | Admitting: Podiatry

## 2022-11-15 ENCOUNTER — Telehealth: Payer: Self-pay | Admitting: Podiatry

## 2022-11-15 ENCOUNTER — Ambulatory Visit (INDEPENDENT_AMBULATORY_CARE_PROVIDER_SITE_OTHER): Payer: Federal, State, Local not specified - PPO | Admitting: Podiatry

## 2022-11-15 ENCOUNTER — Ambulatory Visit: Payer: Federal, State, Local not specified - PPO

## 2022-11-15 ENCOUNTER — Other Ambulatory Visit: Payer: Self-pay

## 2022-11-15 ENCOUNTER — Encounter: Payer: Self-pay | Admitting: Podiatry

## 2022-11-15 DIAGNOSIS — M722 Plantar fascial fibromatosis: Secondary | ICD-10-CM

## 2022-11-15 MED ORDER — DEXAMETHASONE SODIUM PHOSPHATE 120 MG/30ML IJ SOLN
4.0000 mg | Freq: Once | INTRAMUSCULAR | Status: AC
  Administered 2022-11-15: 4 mg via INTRA_ARTICULAR

## 2022-11-15 MED ORDER — CELECOXIB 200 MG PO CAPS: 200.0000 mg | ORAL_CAPSULE | Freq: Every day | ORAL | 0 refills | Status: AC

## 2022-11-15 MED ORDER — TRIAMCINOLONE ACETONIDE 10 MG/ML IJ SUSP
2.5000 mg | Freq: Once | INTRAMUSCULAR | Status: AC
  Administered 2022-11-15: 2.5 mg via INTRA_ARTICULAR

## 2022-11-15 NOTE — Telephone Encounter (Signed)
Patient called.  She saw Dr. Ralene Cork this morning.  States she was told to continue with the meloxicam, but she is out of it and needs more called in.  However, it appears another physician prescribed some celebrex 200mg  QD on 10/31/22, so I am not sure if that's what was meant.    Routing to Dr. Ralene Cork for response on this phone note.

## 2022-11-15 NOTE — Progress Notes (Addendum)
Subjective:  Patient ID: Rhonda Oneal, female    DOB: Sep 15, 1965,   MRN: [id number]  Chief Complaint  Patient presents with   Plantar Fasciitis    Pt presents for bil foot pain.    57 y.o. female presents for return of right heel pain and now having some pain in her left foot. Relates on the left has more pain in the arch and the ball of the foot that has been ongoing for about three weeks. She has continued stretching and night splint. Wondering about orthtoics.  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   Denies any other pedal complaints. Denies n/v/f/c.   Past Medical History:  Diagnosis Date   Allergy to mold    Arthritis    Asthma    DERMATITIS, ATOPIC 02/09/2010   Qualifier: Diagnosis of  By: Orson Aloe MD, Tinnie Gens     EDEMA LEG 03/22/2010   Qualifier: Diagnosis of  By: Orson Aloe MD, Tinnie Gens     Fibromyalgia    IBS (irritable bowel syndrome)    Morbid obesity (HCC)     Objective:  Physical Exam: Vascular: DP/PT pulses 2/4 bilateral. CFT <3 seconds. Normal hair growth on digits. No edema.  Skin. No lacerations or abrasions bilateral feet.  Musculoskeletal: MMT 5/5 bilateral lower extremities in DF, PF, Inversion and Eversion. Deceased ROM in DF of ankle joint.  Mildly tender to plantar right heel mostly lateral calcaneal tubercle and more proximally along proximal heel. Some pain medially. Some pain along achilles tendon. No pain along PT or arch and no pain with calcaneal squeeze. Tender to the medial calcaneal tubercle on the left. No pain along arch or achilles. No pain with calcaneal squeeze on this side.  Neurological: Sensation intact to light touch.   Assessment:   1. Plantar fasciitis of right foot   2. Plantar fasciitis, left       Plan:  Patient was evaluated and treated and all questions answered. Discussed plantar fasciitis and pes planus with patient.  X-rays reviewed and discussed  with patient. No acute fractures or dislocations noted. Mild spurring noted at inferior calcaneus on right. Minimal spurring on left with collapse of the midfoot arch.  Discussed treatment options including, ice, NSAIDS, supportive shoes, bracing, and stretching.  Continue stretching.  Continue celebrex as needed.  Patient requesting injection today. Procedure note below.  Continue night splint and support.  Discussed CMO and iwll check insurance.  Follow-up as needed.    Procedure:  Discussed etiology, pathology, conservative vs. surgical therapies. At this time a plantar fascial injection was recommended.  The patient agreed and a sterile skin prep was applied.  An injection consisting of  dexamethasone and marcaine mixture was infiltrated at the point of maximal tenderness on the bilateral Heel.  Bandaid applied. The patient tolerated this well and was given instructions for aftercare.    Louann Sjogren, DPM

## 2022-11-15 NOTE — Addendum Note (Signed)
Addended by: Louann Sjogren R on: 11/15/2022 03:23 PM   Modules accepted: Orders

## 2022-12-28 ENCOUNTER — Ambulatory Visit
Admission: EM | Admit: 2022-12-28 | Discharge: 2022-12-28 | Disposition: A | Discharge status: Home / Self Care | Payer: Federal, State, Local not specified - PPO

## 2022-12-28 ENCOUNTER — Other Ambulatory Visit: Payer: Self-pay

## 2022-12-28 VITALS — BP 174/98 | HR 89 | Temp 97.8°F | Resp 16

## 2022-12-28 DIAGNOSIS — S8011XA Contusion of right lower leg, initial encounter: Secondary | ICD-10-CM | POA: Diagnosis not present

## 2022-12-28 NOTE — Discharge Instructions (Signed)
Return if any problems.  Follow up with your Physician for recheck  

## 2022-12-28 NOTE — ED Triage Notes (Signed)
Has pain to right leg, since giving diabetes injection x 1 week.

## 2022-12-28 NOTE — ED Provider Notes (Signed)
[facility]    CSN: [id number] Arrival date & time: 12/28/22  1156      History   Chief Complaint Chief Complaint  Patient presents with   Leg Pain    HPI Rhonda Oneal Rhonda Oneal is a 57 y.o. female.   Patient complains of a bruise to her right thigh.  Patient reports that she does Ozempic injections.  Patient reports that she is unsure if she gave herself an injection at this site.  Patient states that it seems to be further out than where she normally does her shots.  Patient also reports that she has had some pain in the back of her right leg.  Patient reports that she has fibromyalgia.  Patient has been using a lidocaine patch in the area.   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   Allergic rhinitis 03/14/2020   Fibromyalgia 03/14/2020   Sciatic nerve pain 03/14/2020   Trigger thumb, left thumb 04/02/2019   Degenerative spondylolisthesis 12/07/2016   Mild intermittent asthma with exacerbation 11/05/2016   Pain of left sacroiliac joint 04/18/2016   Trochanteric bursitis of both hips 03/16/2016   Ischial bursitis 12/15/2015   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   Sickle cell trait (HCC) 01/20/2013   Synovitis of foot 12/26/2012   Arthralgia 12/08/2012   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   Cervical spinal stenosis 11/12/2012   Brachial neuritis 11/12/2012   Left lumbar radiculopathy 05/13/2012   Irritable bowel syndrome 11/29/2011   Morbid obesity (HCC)     ALLERGIC RHINITIS, SEASONAL 06/08/2010   EDEMA LEG 03/22/2010   DERMATITIS, ATOPIC 02/09/2010   Vitamin D deficiency 04/15/2008    Past Surgical History:  Procedure Laterality Date   CESAREAN SECTION     TONSILLECTOMY AND ADENOIDECTOMY     WISDOM TOOTH EXTRACTION      OB History     Gravida  2   Para  2   Term      Preterm      AB      Living         SAB      IAB      Ectopic      Multiple      Live Births               Home Medications    Prior to Admission medications   Medication Sig Start Date End Date Taking? Authorizing Provider  amitriptyline (ELAVIL) 10 MG tablet Take 10 mg by mouth at bedtime.   Yes [provider]  diclofenac (CATAFLAM) 50 MG tablet Take 50 mg by mouth 3 (three) times daily.   Yes [provider]  gabapentin (NEURONTIN) 600 MG tablet Take 600 mg by mouth 3 (three) times daily.   Yes [provider]  traMADol (ULTRAM) 50 MG tablet Take by mouth every 6 (six) hours as needed.   Yes [provider]  dexlansoprazole (DEXILANT) 60 MG capsule Take 60 mg by mouth daily.    [provider]  diclofenac Sodium (VOLTAREN) 1 % GEL APPLY TO AFFECTED AREA 3 TIMES A DAY 12/14/19   [provider]  HYDROcodone-acetaminophen (NORCO/VICODIN) 5-325 MG tablet Take 1 tablet by mouth every 6 (six) hours as needed for moderate pain.    [provider]  ibuprofen (ADVIL) 800 MG tablet  01/07/19   [provider]  lidocaine (LIDODERM) 5 % PLACE 1 PATCH ON THE SKIN DAILY FOR 30 DAY REMOVE & DISCARD PATCH WITHIN 12 HRS OR AS DIRECTED BY MD 02/15/20   [provider]  losartan-hydrochlorothiazide [name]) 50-12.5 MG tablet Take by mouth. 12/03/19 12/02/20  [provider]  methocarbamol (ROBAXIN) 750 MG tablet Take 750 mg by mouth 4 (four) times daily.    [provider]  pravastatin (PRAVACHOL) 20 MG tablet Take 20 mg by mouth at bedtime. 03/19/19   [provider]  Semaglutide (OZEMPIC, 0.25 OR 0.5 MG/DOSE, [name]) Inject into the skin.    [provider]    Family History Family History  Problem Relation Age of Onset   Diabetes Mother    Cancer Mother        Lung   Hyperlipidemia Father    Breast cancer Maternal Aunt    Ovarian cancer Paternal Aunt     Social History Social History   Tobacco Use   Smoking status: Never   Smokeless tobacco: Never  Vaping Use   Vaping status: Never Used  Substance Use Topics   Alcohol use: No   Drug use: No     Allergies   Pregabalin, Cymbalta [duloxetine hcl], Doxycycline, and Ferrous sulfate   Review of Systems Review of Systems  All other systems reviewed and are negative.    Physical Exam Triage Vital Signs ED Triage Vitals  Encounter Vitals Group     BP 12/28/22 1210 (!) 174/98     Systolic BP Percentile --      Diastolic BP Percentile --      Pulse Rate 12/28/22 1210 89     Resp 12/28/22 1210 16     Temp 12/28/22 1210 97.8 F (36.6 C)     Temp Source 12/28/22 1210 Oral     SpO2 12/28/22 1210 99 %     Weight --      Height --      Head Circumference --      Peak Flow --      Pain Score 12/28/22 1217 8     Pain Loc --      Pain Education --      Exclude from Growth Chart --    No data found.  Updated Vital Signs BP (!) 174/98   Pulse 89   Temp 97.8 F (36.6 C) (Oral)   Resp 16   SpO2 99%   Visual Acuity Right Eye Distance:   Left Eye Distance:   Bilateral Distance:    Right Eye Near:   Left Eye Near:    Bilateral Near:     Physical Exam Vitals and nursing note reviewed.  Constitutional:      Appearance: She is well-developed.  HENT:     Head: Normocephalic.  Cardiovascular:     Rate and Rhythm: Normal rate.  Pulmonary:     Effort: Pulmonary effort is normal.  Abdominal:     General: There is no distension.  Musculoskeletal:  General: Normal range of motion.     Cervical back: Normal range of motion.     Comments: Bruised  right outer thigh 12 cm area central clearing, no sign of infection,  Skin:    General: Skin is warm.  Neurological:     General: No focal deficit present.     Mental Status: She is alert and oriented to person, place, and time.      UC Treatments / Results  Labs (all labs ordered are listed, but only abnormal results are displayed) Labs Reviewed - No data to display  EKG   Radiology No results found.  Procedures Procedures (including critical care time)  Medications Ordered in UC Medications - No data to display  Initial Impression / Assessment and Plan / UC Course  I have reviewed the triage vital signs and the nursing notes.  Pertinent labs & imaging results that were available during my care of the patient were reviewed by me and considered in my medical decision making (see chart for details).     Patient appears to have a uncomplicated contusion to her right thigh.  This could be from Ozempic injection.  Patient does not have a hematoma she does not have any signs of infection.  This could also just be a contusion from hitting the area.  Patient is counseled on symptomatic care. Final Clinical Impressions(s) / UC Diagnoses   Final diagnoses:  Contusion of right lower extremity, initial encounter     Discharge Instructions      Return if any problems.  Follow up with your Physician for recheck    ED Prescriptions   None    PDMP not reviewed this encounter. An After Visit Summary was printed and given to the patient.       Elson Areas, [place] 12/28/22 1310

## 2023-01-24 ENCOUNTER — Ambulatory Visit
Admission: RE | Admit: 2023-01-24 | Discharge: 2023-01-24 | Disposition: A | Discharge status: Home / Self Care | Payer: Federal, State, Local not specified - PPO | Source: Ambulatory Visit | Attending: Family Medicine | Admitting: Family Medicine

## 2023-01-24 VITALS — BP 144/82 | HR 78 | Temp 98.4°F | Resp 16

## 2023-01-24 DIAGNOSIS — M5416 Radiculopathy, lumbar region: Secondary | ICD-10-CM

## 2023-01-24 MED ORDER — PREDNISONE 20 MG PO TABS: ORAL_TABLET | ORAL | 0 refills | Status: DC

## 2023-01-24 NOTE — ED Triage Notes (Addendum)
Pt presents to uc with co of right hip pain  non radiating for one month. Pt reports she was seen here and was not treated for anything symptoms have continued . Pt has been taking NSAIDS, voltaren gel, tramadol, ice and heat with no improvement.

## 2023-01-24 NOTE — ED Provider Notes (Signed)
[facility]    CSN: [id number] Arrival date & time: 01/24/23  1109      History   Chief Complaint Chief Complaint  Patient presents with   Hip Pain    APPT 1115AM    HPI Rhonda Oneal is a 57 y.o. female.   Patient complains of right hip and buttock pain for about a month.  Her symptoms have not improved with NSAID's, Voltaren gel, tramadol, and heat.  She denies bowel or bladder dysfunction, and no saddle numbness.  She denies recent injury. She has a history of lumbar foraminal stenosis, followed by [facility].  Review of records reveals that she underwent a left L5-S1 nerve root block on 11/07/22. Patient notes that her pain is worse when she flexes her back forward, and worse when extending her back.  She state that she has a follow-up appointment with Dr. Rodney Langton in one week.  The history is provided by the patient.    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   Allergic rhinitis 03/14/2020   Fibromyalgia 03/14/2020   Sciatic nerve pain 03/14/2020   Trigger thumb, left thumb 04/02/2019   Degenerative spondylolisthesis 12/07/2016   Mild intermittent asthma with exacerbation 11/05/2016   Pain of left sacroiliac joint 04/18/2016   Trochanteric bursitis of both hips 03/16/2016   Ischial bursitis 12/15/2015   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   Sickle cell trait (HCC) 01/20/2013   Synovitis of foot 12/26/2012   Arthralgia 12/08/2012   Sinus tarsi  syndrome of both ankles 11/28/2012   Cervical spinal stenosis 11/12/2012   Brachial neuritis 11/12/2012   Left lumbar radiculopathy 05/13/2012   Irritable bowel syndrome 11/29/2011   Morbid obesity (HCC)    ALLERGIC RHINITIS, SEASONAL 06/08/2010   EDEMA LEG 03/22/2010   DERMATITIS, ATOPIC 02/09/2010   Vitamin D deficiency 04/15/2008    Past Surgical History:  Procedure Laterality Date   CESAREAN SECTION     TONSILLECTOMY AND ADENOIDECTOMY     WISDOM TOOTH EXTRACTION      OB History     Gravida  2   Para  2   Term      Preterm      AB      Living         SAB      IAB      Ectopic      Multiple      Live Births               Home Medications    Prior to Admission medications   Medication Sig Start Date End Date Taking? Authorizing Provider  predniSONE (DELTASONE) 20 MG tablet Take one tab by mouth twice daily for 4 days, then one daily for 3 days. Take with food. 01/24/23  Yes Lattie Haw, MD  amitriptyline (ELAVIL) 10 MG tablet Take 10 mg by mouth at bedtime.    [provider]  dexlansoprazole (DEXILANT) 60 MG capsule Take 60 mg by mouth daily.    [provider]  diclofenac (CATAFLAM) 50 MG tablet Take 50 mg by mouth 3 (three) times daily.    [provider]  diclofenac Sodium (VOLTAREN) 1 % GEL APPLY TO AFFECTED AREA 3 TIMES A DAY 12/14/19   [provider]  gabapentin (NEURONTIN) 600 MG tablet Take 600 mg by mouth 3 (three) times daily.    [provider]  HYDROcodone-acetaminophen (NORCO/VICODIN) 5-325 MG tablet Take 1 tablet by mouth every 6 (six) hours as needed for moderate pain.    [provider]  ibuprofen (ADVIL) 800 MG tablet  01/07/19   [provider]  lidocaine (LIDODERM) 5 % PLACE 1 PATCH ON THE SKIN DAILY FOR 30 DAY REMOVE & DISCARD PATCH WITHIN 12 HRS OR AS DIRECTED BY MD 02/15/20   [provider]  losartan-hydrochlorothiazide [name]) 50-12.5 MG tablet Take  by mouth. 12/03/19 12/02/20  [provider]  methocarbamol (ROBAXIN) 750 MG tablet Take 750 mg by mouth 4 (four) times daily.    [provider]  pravastatin (PRAVACHOL) 20 MG tablet Take 20 mg by mouth at bedtime. 03/19/19   [provider]  Semaglutide (OZEMPIC, 0.25 OR 0.5 MG/DOSE, [place]) Inject into the skin.    [provider]  traMADol (ULTRAM) 50 MG tablet Take by mouth every 6 (six) hours as needed.    [provider]    Family History Family History  Problem Relation Age of Onset   Diabetes Mother    Cancer Mother        Lung   Hyperlipidemia Father    Breast cancer Maternal Aunt    Ovarian cancer Paternal Aunt     Social History Social History   Tobacco Use   Smoking status: Never   Smokeless tobacco: Never  Vaping Use   Vaping status: Never Used  Substance Use Topics   Alcohol use: No   Drug use: No     Allergies   Pregabalin, Cymbalta [duloxetine hcl], Doxycycline, and Ferrous sulfate   Review of Systems Review of Systems  Constitutional:  Negative for activity change, appetite change, chills, diaphoresis, fatigue and fever.  Cardiovascular:  Negative for chest pain.  Gastrointestinal:  Negative for abdominal pain.  Musculoskeletal:  Positive for back pain.  Skin:  Negative for rash.  Neurological:  Negative for numbness.  All other systems reviewed and are negative.    Physical Exam Triage Vital Signs ED Triage Vitals  Encounter Vitals Group     BP 01/24/23 1122 (!) 144/82     Systolic BP Percentile --      Diastolic BP Percentile --      Pulse Rate 01/24/23 1122 78     Resp 01/24/23 1122 16     Temp 01/24/23 1122 98.4 F (36.9 C)     Temp src --      SpO2 01/24/23 1122 98 %     Weight --      Height --      Head Circumference --      Peak Flow --      Pain Score 01/24/23 1120 9     Pain Loc --      Pain Education --      Exclude from Growth Chart --    No data found.  Updated Vital  Signs BP (!) 144/82   Pulse 78   Temp 98.4 F (36.9 C)   Resp  16   SpO2 98%   Visual Acuity Right Eye Distance:   Left Eye Distance:   Bilateral Distance:    Right Eye Near:   Left Eye Near:    Bilateral Near:     Physical Exam Vitals and nursing note reviewed.  Constitutional:      General: She is not in acute distress.    Appearance: She is obese. She is not ill-appearing.  HENT:     Head: Normocephalic.     Mouth/Throat:     Mouth: Mucous membranes are moist.  Eyes:     Extraocular Movements: Extraocular movements intact.     Conjunctiva/sclera: Conjunctivae normal.     Pupils: Pupils are equal, round, and reactive to light.  Cardiovascular:     Rate and Rhythm: Normal rate and regular rhythm.     Heart sounds: Normal heart sounds.  Pulmonary:     Breath sounds: Normal breath sounds.  Abdominal:     Tenderness: There is no abdominal tenderness.  Musculoskeletal:     Cervical back: Normal range of motion.     Lumbar back: Positive right straight leg raise test.       Back:     Right lower leg: No edema.     Left lower leg: No edema.     Comments: Back:  Range of motion relatively well preserved.   Mild enderness in the midline and right paraspinous muscles from L5 to Sacral area.  Straight leg raising test is positive on the right.  Strength and sensation in the lower extremities is normal.  Patellar and achilles reflexes are present.   Skin:    General: Skin is warm and dry.     Findings: No rash.  Neurological:     Mental Status: She is alert.      UC Treatments / Results  Labs (all labs ordered are listed, but only abnormal results are displayed) Labs Reviewed - No data to display  EKG   Radiology No results found.  Procedures Procedures (including critical care time)  Medications Ordered in UC Medications - No data to display  Initial Impression / Assessment and Plan / UC Course  I have reviewed the triage vital signs and the nursing  notes.  Pertinent labs & imaging results that were available during my care of the patient were reviewed by me and considered in my medical decision making (see chart for details).    Lumbar radiculopathy with new right sided symptoms. Begin prednisone burst/taper. Followup with Dr. Rodney [facility]ton ([facility]) as scheduled in one week. Final Clinical Impressions(s) / UC Diagnoses   Final diagnoses:  Lumbar radiculopathy, chronic     Discharge Instructions      Apply ice pack to mid-lower back for 20 to 30 minutes, 3 to 4 times daily  Continue until pain decreases.  May continue tramadol for pain. Avoid heavy lifting.    ED Prescriptions     Medication Sig Dispense Auth. Provider   predniSONE (DELTASONE) 20 MG tablet Take one tab by mouth twice daily for 4 days, then one daily for 3 days. Take with food. 11 tablet Lattie Haw, MD         Lattie Haw, MD 01/26/23 [phone]

## 2023-01-24 NOTE — Discharge Instructions (Signed)
Apply ice pack to mid-lower back for 20 to 30 minutes, 3 to 4 times daily  Continue until pain decreases.  May continue tramadol for pain. Avoid heavy lifting.

## 2023-01-31 ENCOUNTER — Ambulatory Visit: Payer: Federal, State, Local not specified - PPO

## 2023-01-31 ENCOUNTER — Ambulatory Visit (INDEPENDENT_AMBULATORY_CARE_PROVIDER_SITE_OTHER): Payer: Federal, State, Local not specified - PPO | Admitting: Sports Medicine

## 2023-01-31 DIAGNOSIS — M25551 Pain in right hip: Secondary | ICD-10-CM

## 2023-01-31 DIAGNOSIS — M76891 Other specified enthesopathies of right lower limb, excluding foot: Secondary | ICD-10-CM | POA: Diagnosis not present

## 2023-01-31 MED ORDER — VENLAFAXINE HCL ER 37.5 MG PO CP24: ORAL_CAPSULE | ORAL | 0 refills | Status: DC

## 2023-01-31 MED ORDER — VENLAFAXINE HCL ER 75 MG PO CP24: 75.0000 mg | ORAL_CAPSULE | Freq: Every day | ORAL | 1 refills | Status: DC

## 2023-01-31 NOTE — Progress Notes (Signed)
    Procedures performed today:    None.  Independent interpretation of notes and tests performed by another provider:   None.  Brief History, Exam, Impression, and Recommendations:    Hip abductor tendinitis, right Very pleasant 57 year old female, she has had several weeks of increasing pain right hip, on exam she does not some tenderness over the greater trochanter, on exam she has profound weakness of the right hip to abduction, she cannot hold her right leg up against gravity. She has similar symptoms on the left side although lesser so, she can barely hold left side up against gravity. I explained her the importance of hip abductor retraining to resolve this problem. The diagnosis is trochanteric bursitis but the underlying problem is more of a hip abductor tendinopathy. X-rays today, she is not getting sufficient pain control with her amitriptyline, gabapentin, and tramadol. She does have an appointment coming up with pain management, she was on hydrocodone in the past but I think we should certainly try to maximize our neuropathic treatments, we will add venlafaxine, she did not respond well to Cymbalta in the past. Return to see me in about 2 months.    ____________________________________________ Ihor Austin. Benjamin Stain, M.D., ABFM., CAQSM., AME. Primary Care and Sports Medicine [facility]  Adjunct Professor of Family Medicine  [employer]  Restaurant manager, fast food

## 2023-01-31 NOTE — Assessment & Plan Note (Signed)
Very pleasant 57 year old female, she has had several weeks of increasing pain right hip, on exam she does not some tenderness over the greater trochanter, on exam she has profound weakness of the right hip to abduction, she cannot hold her right leg up against gravity. She has similar symptoms on the left side although lesser so, she can barely hold left side up against gravity. I explained her the importance of hip abductor retraining to resolve this problem. The diagnosis is trochanteric bursitis but the underlying problem is more of a hip abductor tendinopathy. X-rays today, she is not getting sufficient pain control with her amitriptyline, gabapentin, and tramadol. She does have an appointment coming up with pain management, she was on hydrocodone in the past but I think we should certainly try to maximize our neuropathic treatments, we will add venlafaxine, she did not respond well to Cymbalta in the past. Return to see me in about 2 months.

## 2023-02-08 ENCOUNTER — Encounter (INDEPENDENT_AMBULATORY_CARE_PROVIDER_SITE_OTHER): Payer: Federal, State, Local not specified - PPO | Admitting: Sports Medicine

## 2023-02-08 DIAGNOSIS — M76891 Other specified enthesopathies of right lower limb, excluding foot: Secondary | ICD-10-CM | POA: Diagnosis not present

## 2023-02-08 NOTE — Telephone Encounter (Signed)

## 2023-02-21 ENCOUNTER — Ambulatory Visit
Admission: RE | Admit: 2023-02-21 | Discharge: 2023-02-21 | Disposition: A | Discharge status: Home / Self Care | Payer: Federal, State, Local not specified - PPO | Source: Ambulatory Visit | Attending: Family Medicine | Admitting: Family Medicine

## 2023-02-21 VITALS — HR 95 | Temp 98.5°F | Resp 16

## 2023-02-21 DIAGNOSIS — J3489 Other specified disorders of nose and nasal sinuses: Secondary | ICD-10-CM

## 2023-02-21 DIAGNOSIS — J0100 Acute maxillary sinusitis, unspecified: Secondary | ICD-10-CM | POA: Diagnosis not present

## 2023-02-21 MED ORDER — CEFDINIR 300 MG PO CAPS: 300.0000 mg | ORAL_CAPSULE | Freq: Two times a day (BID) | ORAL | 0 refills | Status: AC

## 2023-02-21 MED ORDER — PREDNISONE 20 MG PO TABS: ORAL_TABLET | ORAL | 0 refills | Status: DC

## 2023-02-21 NOTE — Discharge Instructions (Addendum)
 Advised patient to take medication as directed with food to completion.  Advised patient to take prednisone  with first dose of cefdinir  for the next 5 of 7 days.  Encouraged to increase daily water intake to 64 ounces per day while taking these medications.  Advised if symptoms worsen and/or unresolved please follow-up with PCP or here for further evaluation.

## 2023-02-21 NOTE — ED Triage Notes (Signed)
 Pt presents to [facility] with co of sinus pain for 3 days pt has been using tylenol sinus with minimal improvement.

## 2023-02-21 NOTE — ED Provider Notes (Signed)
 [facility]    CSN: [id number] Arrival date & time: 02/21/23  1645      History   Chief Complaint Chief Complaint  Patient presents with   Nasal Congestion    Headache - Entered by patient   Facial Pain    HPI Rhonda Oneal is a 58 y.o. female.   HPI 58 year old female presents with nasal congestion and headache.  PMH significant for morbid obesity and fibromyalgia.  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   Allergic rhinitis 03/14/2020   Fibromyalgia 03/14/2020   Sciatic nerve pain 03/14/2020   Trigger thumb, left thumb 04/02/2019   Degenerative spondylolisthesis 12/07/2016   Mild intermittent asthma with exacerbation 11/05/2016   Pain of left sacroiliac joint 04/18/2016   Trochanteric bursitis of both hips 03/16/2016   Ischial bursitis 12/15/2015   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   Sickle cell trait (HCC) 01/20/2013   Synovitis of foot 12/26/2012   Arthralgia 12/08/2012   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   Cervical spinal stenosis 11/12/2012   Brachial neuritis 11/12/2012   Left lumbar radiculopathy 05/13/2012   Irritable bowel syndrome 11/29/2011   Morbid obesity (HCC)    ALLERGIC RHINITIS, SEASONAL 06/08/2010   EDEMA LEG 03/22/2010   DERMATITIS, ATOPIC 02/09/2010   Vitamin D deficiency 04/15/2008    Past Surgical History:  Procedure Laterality Date   CESAREAN SECTION     TONSILLECTOMY AND ADENOIDECTOMY      WISDOM TOOTH EXTRACTION      OB History     Gravida  2   Para  2   Term      Preterm      AB      Living         SAB      IAB      Ectopic      Multiple      Live Births               Home Medications    Prior to Admission medications   Medication Sig Start Date End Date Taking? Authorizing Provider  cefdinir  (OMNICEF ) 300 MG capsule Take 1 capsule (300 mg total) by mouth 2 (two) times daily for 7 days. 02/21/23 02/28/23 Yes Teddy Sharper, FNP  losartan-hydrochlorothiazide (HYZAAR) 50-12.5 MG tablet Take by mouth. 12/03/19 02/21/23 Yes [provider]  predniSONE  (DELTASONE ) 20 MG tablet Take 3 tabs PO daily x 5 days. 02/21/23  Yes Teddy Sharper, FNP  amitriptyline  (ELAVIL ) 10 MG tablet Take 10 mg by mouth at bedtime.    [provider]  dexlansoprazole  (DEXILANT ) 60 MG capsule Take 60 mg by mouth daily.    [provider]  diclofenac  (CATAFLAM) 50 MG tablet Take 50 mg by mouth 3 (three) times daily.    [provider]  diclofenac  Sodium (VOLTAREN ) 1 % GEL APPLY TO AFFECTED AREA 3 TIMES A  DAY 12/14/19   [provider]  gabapentin  (NEURONTIN ) 600 MG tablet Take 600 mg by mouth 3 (three) times daily.    [provider]  HYDROcodone -acetaminophen  (NORCO/VICODIN) 5-325 MG tablet Take 1 tablet by mouth every 6 (six) hours as needed for moderate pain.    [provider]  ibuprofen (ADVIL) 800 MG tablet  01/07/19   [provider]  lidocaine  (LIDODERM ) 5 % PLACE 1 PATCH ON THE SKIN DAILY FOR 30 DAY REMOVE & DISCARD PATCH WITHIN 12 HRS OR AS DIRECTED BY MD 02/15/20   [provider]  methocarbamol (ROBAXIN) 750 MG tablet Take 750 mg by mouth 4 (four) times daily.    [provider]  venlafaxine  XR (EFFEXOR  XR) 37.5 MG 24 hr capsule One tab by mouth daily for 3 days then switch to the higher dose 01/31/23   Curtis Debby PARAS, MD  venlafaxine  XR (EFFEXOR  XR) 75 MG 24 hr capsule Take  1 capsule (75 mg total) by mouth daily with breakfast. 01/31/23   Curtis Debby PARAS, MD    Family History Family History  Problem Relation Age of Onset   Diabetes Mother    Cancer Mother        Lung   Hyperlipidemia Father    Breast cancer Maternal Aunt    Ovarian cancer Paternal Aunt     Social History Social History   Tobacco Use   Smoking status: Never   Smokeless tobacco: Never  Vaping Use   Vaping status: Never Used  Substance Use Topics   Alcohol use: No   Drug use: No     Allergies   Rosuvastatin, Simvastatin, Pregabalin , Amoxicillin -pot clavulanate, Cymbalta [duloxetine hcl], Doxycycline , and Ferrous sulfate   Review of Systems Review of Systems   Physical Exam Triage Vital Signs ED Triage Vitals  Encounter Vitals Group     BP      Systolic BP Percentile      Diastolic BP Percentile      Pulse      Resp      Temp      Temp src      SpO2      Weight      Height      Head Circumference      Peak Flow      Pain Score      Pain Loc      Pain Education      Exclude from Growth Chart    No data found.  Updated Vital Signs Pulse 95   Temp 98.5 F (36.9 C) (Oral)   Resp 16   SpO2 98%     Physical Exam Vitals and nursing note reviewed.  Constitutional:      Appearance: Normal appearance. She is obese.  HENT:     Head: Normocephalic and atraumatic.     Right Ear: Tympanic membrane and external ear normal.     Left Ear: Tympanic membrane and external ear normal.     Ears:     Comments: Significant eustachian tube dysfunction noted bilaterally    Mouth/Throat:     Mouth: Mucous membranes are moist.     Pharynx: Oropharynx is clear.  Eyes:     Extraocular Movements: Extraocular movements intact.     Conjunctiva/sclera: Conjunctivae normal.     Pupils: Pupils are equal, round, and reactive to light.  Cardiovascular:     Rate and Rhythm: Normal rate and regular rhythm.     Pulses: Normal pulses.  Heart sounds: Normal heart  sounds.  Pulmonary:     Effort: Pulmonary effort is normal.     Breath sounds: No wheezing, rhonchi or rales.  Musculoskeletal:        General: Normal range of motion.     Cervical back: Normal range of motion and neck supple.  Skin:    General: Skin is warm and dry.  Neurological:     General: No focal deficit present.     Mental Status: She is alert and oriented to person, place, and time. Mental status is at baseline.  Psychiatric:        Mood and Affect: Mood normal.        Behavior: Behavior normal.      UC Treatments / Results  Labs (all labs ordered are listed, but only abnormal results are displayed) Labs Reviewed - No data to display  EKG   Radiology No results found.  Procedures Procedures (including critical care time)  Medications Ordered in UC Medications - No data to display  Initial Impression / Assessment and Plan / UC Course  I have reviewed the triage vital signs and the nursing notes.  Pertinent labs & imaging results that were available during my care of the patient were reviewed by me and considered in my medical decision making (see chart for details).     MDM: 1.  Acute maxillary sinusitis, recurrence not specified-Rx'd cefdinir  300 mg capsule: Take 1 capsule twice daily x 7 days; 2.  Sinus pressure-Rx'd prednisone  20 mg tablet: Take 3 tabs p.o. daily x 5 days. Advised patient to take medication as directed with food to completion.  Advised patient to take prednisone  with first dose of cefdinir  for the next 5 of 7 days.  Encouraged to increase daily water intake to 64 ounces per day while taking these medications.  Advised if symptoms worsen and/or unresolved please follow-up with PCP or here for further evaluation.  Patient discharged home, hemodynamically stable. Final Clinical Impressions(s) / UC Diagnoses   Final diagnoses:  Acute maxillary sinusitis, recurrence not specified  Sinus pressure     Discharge Instructions      Advised  patient to take medication as directed with food to completion.  Advised patient to take prednisone  with first dose of cefdinir  for the next 5 of 7 days.  Encouraged to increase daily water intake to 64 ounces per day while taking these medications.  Advised if symptoms worsen and/or unresolved please follow-up with PCP or here for further evaluation.     ED Prescriptions     Medication Sig Dispense Auth. Provider   cefdinir  (OMNICEF ) 300 MG capsule Take 1 capsule (300 mg total) by mouth 2 (two) times daily for 7 days. 14 capsule Donalda Job, FNP   predniSONE  (DELTASONE ) 20 MG tablet Take 3 tabs PO daily x 5 days. 15 tablet Latoya Maulding, FNP      PDMP not reviewed this encounter.   Teddy Sharper, FNP 02/21/23 1825

## 2023-03-01 ENCOUNTER — Ambulatory Visit (INDEPENDENT_AMBULATORY_CARE_PROVIDER_SITE_OTHER): Payer: Federal, State, Local not specified - PPO

## 2023-03-01 ENCOUNTER — Other Ambulatory Visit: Payer: Self-pay

## 2023-03-01 ENCOUNTER — Ambulatory Visit
Admission: RE | Admit: 2023-03-01 | Discharge: 2023-03-01 | Disposition: A | Discharge status: Home / Self Care | Payer: Federal, State, Local not specified - PPO | Source: Ambulatory Visit | Attending: Family Medicine | Admitting: Family Medicine

## 2023-03-01 VITALS — BP 154/94 | HR 96 | Temp 98.4°F | Resp 16

## 2023-03-01 DIAGNOSIS — M5442 Lumbago with sciatica, left side: Secondary | ICD-10-CM

## 2023-03-01 DIAGNOSIS — M5416 Radiculopathy, lumbar region: Secondary | ICD-10-CM

## 2023-03-01 MED ORDER — METHYLPREDNISOLONE SODIUM SUCC 125 MG IJ SOLR
125.0000 mg | Freq: Once | INTRAMUSCULAR | Status: AC
  Administered 2023-03-01: 125 mg via INTRAMUSCULAR

## 2023-03-01 NOTE — Discharge Instructions (Addendum)
 Advised patient of lumbar spine x-ray results with hardcopy provided.  Advised if symptoms worsen and/or unresolved please follow-up with PCP for further evaluation.

## 2023-03-01 NOTE — ED Provider Notes (Signed)
 [facility]    CSN: [id number] Arrival date & time: 03/01/23  1120      History   Chief Complaint Chief Complaint  Patient presents with   Back Pain    Entered by patient    HPI Rhonda Oneal is a 58 y.o. female.   HPI pleasant 58 year old female presents with left-sided lumbar back pain for 2 days.  Reports may have injured her back while taking down her Christmas tree 2 days ago.  Patient has been taking hydrocodone , Flexeril , heat and ice with minimal improvement.  PMH significant for morbid obesity, fibromyalgia, and IBS.  Patient is accompanied by her daughter this morning.  Patient was a treated for similar like symptoms on 01/24/2023 please see epic for that encounter note.  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   Allergic rhinitis 03/14/2020   Fibromyalgia 03/14/2020   Sciatic nerve pain 03/14/2020   Trigger thumb, left thumb 04/02/2019   Degenerative spondylolisthesis 12/07/2016   Mild intermittent asthma with exacerbation 11/05/2016   Pain of left sacroiliac joint 04/18/2016   Trochanteric bursitis of both hips 03/16/2016   Ischial bursitis 12/15/2015   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   Sickle cell trait (HCC) 01/20/2013   Synovitis of foot 12/26/2012   Arthralgia 12/08/2012   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   Cervical spinal stenosis 11/12/2012   Brachial neuritis 11/12/2012   Left lumbar radiculopathy  05/13/2012   Irritable bowel syndrome 11/29/2011   Morbid obesity (HCC)    ALLERGIC RHINITIS, SEASONAL 06/08/2010   EDEMA LEG 03/22/2010   DERMATITIS, ATOPIC 02/09/2010   Vitamin D deficiency 04/15/2008    Past Surgical History:  Procedure Laterality Date   CESAREAN SECTION     TONSILLECTOMY AND ADENOIDECTOMY     WISDOM TOOTH EXTRACTION      OB History     Gravida  2   Para  2   Term      Preterm      AB      Living         SAB      IAB      Ectopic      Multiple      Live Births               Home Medications    Prior to Admission medications   Medication Sig Start Date End Date Taking? Authorizing Provider  amitriptyline  (ELAVIL ) 10 MG tablet Take 10 mg by mouth at bedtime.    [provider]  dexlansoprazole  (DEXILANT ) 60 MG capsule Take 60 mg by mouth daily.    [provider]  diclofenac  (CATAFLAM) 50 MG tablet Take 50 mg by mouth 3 (three) times daily.    [provider]  diclofenac  Sodium (VOLTAREN ) 1 % GEL APPLY TO AFFECTED AREA 3 TIMES A DAY 12/14/19   [provider]  gabapentin  (NEURONTIN ) 600 MG tablet Take 600 mg by mouth 3 (three) times daily.    [provider]  HYDROcodone -acetaminophen  (NORCO/VICODIN) 5-325 MG tablet Take 1 tablet by mouth every 6 (six) hours as needed for moderate pain.    [provider]  ibuprofen (ADVIL) 800 MG tablet  01/07/19   [provider]  lidocaine  (LIDODERM ) 5 % PLACE 1 PATCH ON THE SKIN DAILY FOR 30 DAY REMOVE & DISCARD PATCH WITHIN 12 HRS OR AS DIRECTED BY MD 02/15/20   [provider]  losartan-hydrochlorothiazide (HYZAAR) 50-12.5 MG tablet Take by mouth. 12/03/19 02/21/23  [provider]  methocarbamol (ROBAXIN) 750 MG tablet Take 750 mg by mouth 4 (four) times daily.    [provider]  predniSONE  (DELTASONE ) 20 MG tablet Take 3 tabs PO daily x 5 days. 02/21/23   Teddy Sharper, FNP  venlafaxine  XR (EFFEXOR  XR) 37.5  MG 24 hr capsule One tab by mouth daily for 3 days then switch to the higher dose 01/31/23   Curtis Debby PARAS, MD  venlafaxine  XR (EFFEXOR  XR) 75 MG 24 hr capsule Take 1 capsule (75 mg total) by mouth daily with breakfast. 01/31/23   Curtis Debby PARAS, MD    Family History Family History  Problem Relation Age of Onset   Diabetes Mother    Cancer Mother        Lung   Hyperlipidemia Father    Breast cancer Maternal Aunt    Ovarian cancer Paternal Aunt     Social History Social History   Tobacco Use   Smoking status: Never   Smokeless tobacco: Never  Vaping Use   Vaping status: Never Used  Substance Use Topics   Alcohol use: No   Drug use: No     Allergies   Rosuvastatin, Simvastatin, Pregabalin , Amoxicillin -pot clavulanate, Cymbalta [duloxetine hcl], Doxycycline , and Ferrous sulfate   Review of Systems Review of Systems  Musculoskeletal:  Positive for back pain.  All other systems reviewed and are negative.    Physical Exam Triage Vital Signs ED Triage Vitals  Encounter Vitals Group     BP 03/01/23 1134 (!) 154/94     Systolic BP Percentile --      Diastolic BP Percentile --      Pulse Rate 03/01/23 1134 96     Resp 03/01/23 1134 16     Temp 03/01/23 1134 98.4 F (36.9 C)     Temp src --      SpO2 03/01/23 1134 98 %     Weight --      Height --      Head Circumference --      Peak Flow --      Pain Score 03/01/23 1133 10     Pain Loc --      Pain Education --      Exclude from Growth Chart --    No data found.  Updated Vital Signs BP (!) 154/94   Pulse 96   Temp 98.4 F (36.9 C)   Resp 16   SpO2 98%    Physical Exam Vitals and nursing note reviewed.  Constitutional:      Appearance: Normal appearance. She is normal weight.  HENT:     Head: Normocephalic and atraumatic.     Mouth/Throat:     Mouth: Mucous membranes are moist.     Pharynx: Oropharynx is clear.  Eyes:     Extraocular Movements: Extraocular movements intact.      Conjunctiva/sclera: Conjunctivae  normal.     Pupils: Pupils are equal, round, and reactive to light.  Cardiovascular:     Rate and Rhythm: Normal rate and regular rhythm.     Pulses: Normal pulses.     Heart sounds: Normal heart sounds.  Pulmonary:     Effort: Pulmonary effort is normal.     Breath sounds: Normal breath sounds.  Musculoskeletal:        General: Normal range of motion.     Cervical back: Normal range of motion and neck supple.     Comments: Left-sided lumbar spine: Patient reports radiating pain from this area over left buttocks, left hip to left lower leg  Skin:    General: Skin is warm and dry.  Neurological:     General: No focal deficit present.     Mental Status: She is alert and oriented to person, place, and time. Mental status is at baseline.  Psychiatric:        Mood and Affect: Mood normal.        Behavior: Behavior normal.      UC Treatments / Results  Labs (all labs ordered are listed, but only abnormal results are displayed) Labs Reviewed - No data to display  EKG   Radiology DG Lumbar Spine Complete Result Date: 03/01/2023 CLINICAL DATA:  Left lower back pain. EXAM: LUMBAR SPINE - COMPLETE 4+ VIEW COMPARISON:  None Available. FINDINGS: Five lumbar type vertebra. There is no acute fracture or subluxation of the lumbar spine. There are multilevel degenerative changes with lower lumbar facet arthropathy. There is mild levoscoliosis. The visualized posterior elements are intact. The soft tissues are unremarkable. IMPRESSION: 1. No acute findings. 2. Multilevel degenerative changes. Electronically Signed   By: Vanetta Chou M.D.   On: 03/01/2023 12:08    Procedures Procedures (including critical care time)  Medications Ordered in UC Medications  methylPREDNISolone  sodium succinate (SOLU-MEDROL ) 125 mg/2 mL injection 125 mg (125 mg Intramuscular Given 03/01/23 1221)    Initial Impression / Assessment and Plan / UC Course  I have reviewed the  triage vital signs and the nursing notes.  Pertinent labs & imaging results that were available during my care of the patient were reviewed by me and considered in my medical decision making (see chart for details).     MDM: 1.  Left lumbar radiculopathy-IM Solu-Medrol  125 mg given once in clinic and prior to discharge; 2.  Acute left-sided low back pain with left-sided sciatica-lumbar spine x-ray revealed above. Advised patient of lumbar spine x-ray results with hardcopy provided.  Advised if symptoms worsen and/or unresolved please follow-up with PCP for further evaluation.  Patient discharged home, hemodynamically stable. Final Clinical Impressions(s) / UC Diagnoses   Final diagnoses:  Left lumbar radiculopathy  Acute left-sided low back pain with left-sided sciatica     Discharge Instructions      Advised patient of lumbar spine x-ray results with hardcopy provided.  Advised if symptoms worsen and/or unresolved please follow-up with PCP for further evaluation.     ED Prescriptions   None    PDMP not reviewed this encounter.   Teddy Sharper, FNP 03/01/23 1236

## 2023-03-01 NOTE — ED Triage Notes (Signed)
 Pt presents to uc with co of back pain to left side lumbar area since taking her christmas tree down 2 days ago. Pt reports pain is worse with twisting and walking. Pt has been taking hydrocodone, flexeril , heat and ice with minimal improvement.

## 2023-03-04 ENCOUNTER — Ambulatory Visit: Payer: Federal, State, Local not specified - PPO | Attending: Sports Medicine | Admitting: Physical Therapy

## 2023-03-06 ENCOUNTER — Encounter (INDEPENDENT_AMBULATORY_CARE_PROVIDER_SITE_OTHER): Payer: Federal, State, Local not specified - PPO | Admitting: Sports Medicine

## 2023-03-06 DIAGNOSIS — M76891 Other specified enthesopathies of right lower limb, excluding foot: Secondary | ICD-10-CM

## 2023-03-07 MED ORDER — VENLAFAXINE HCL ER 37.5 MG PO CP24: 37.5000 mg | ORAL_CAPSULE | Freq: Every day | ORAL | 11 refills | Status: DC

## 2023-03-07 NOTE — Telephone Encounter (Signed)

## 2023-03-28 ENCOUNTER — Ambulatory Visit: Payer: Federal, State, Local not specified - PPO | Admitting: Sports Medicine

## 2023-04-07 ENCOUNTER — Other Ambulatory Visit: Payer: Self-pay | Admitting: Sports Medicine

## 2023-04-07 DIAGNOSIS — M76891 Other specified enthesopathies of right lower limb, excluding foot: Secondary | ICD-10-CM

## 2023-04-14 ENCOUNTER — Other Ambulatory Visit: Payer: Self-pay

## 2023-04-14 ENCOUNTER — Ambulatory Visit
Admission: EM | Admit: 2023-04-14 | Discharge: 2023-04-14 | Disposition: A | Discharge status: Home / Self Care | Payer: Federal, State, Local not specified - PPO | Attending: Internal Medicine | Admitting: Internal Medicine

## 2023-04-14 ENCOUNTER — Ambulatory Visit: Payer: Federal, State, Local not specified - PPO

## 2023-04-14 DIAGNOSIS — I10 Essential (primary) hypertension: Secondary | ICD-10-CM

## 2023-04-14 DIAGNOSIS — R051 Acute cough: Secondary | ICD-10-CM | POA: Diagnosis not present

## 2023-04-14 DIAGNOSIS — J0100 Acute maxillary sinusitis, unspecified: Secondary | ICD-10-CM | POA: Diagnosis not present

## 2023-04-14 MED ORDER — CEFDINIR 300 MG PO CAPS: 300.0000 mg | ORAL_CAPSULE | Freq: Two times a day (BID) | ORAL | 0 refills | Status: AC

## 2023-04-14 NOTE — Discharge Instructions (Addendum)
 A prescription was sent for cefdinir. This is an antibiotic used to treat upper respiratory infections. Take as directed.   Return in 3-4 days if no improvement. It is very important for you to pay attention to any new symptoms or worsening of your current condition. Please go directly to the Emergency Department immediately should you begin to have any of the following symptoms: shortness of breath, chest pain or difficulty breathing.

## 2023-04-14 NOTE — ED Triage Notes (Signed)
 C/O pain right side of neck, productive cough, nasal congestion, pressure around eyes. Denies fever or chills. Symptoms x 5 days. No known exposures to covid or flu. Seen by primary care MD Thursday. Swabbed for flu and covid and was negative.

## 2023-04-14 NOTE — ED Provider Notes (Signed)
 [facility]  Note:  This document was prepared using Dragon voice recognition software and may include unintentional dictation errors.  MRN: [id number] DOB: 1965/08/08 DATE: 04/14/23   Subjective:  Chief Complaint:  Chief Complaint  Patient presents with   Cough     HPI: Rhonda Oneal is a 58 y.o. female presenting for cough and congestion for the past 5 days. Patient reports productive cough and congestion starting earlier this week. Daughter recently came into town from school. She saw her PCP on Thursday and tested negative for COVID and flu. She was started on a medrol dosepak and benzonatate. She has since developed facial pain and bilateral ear pain. Per her EMR, patient had telemedicine visit this morning and diagnosed with URI. She was given RX for promethazine-dm. Denies fever, nausea/vomiting, sore throat. Endorses cough, congestion, otalgia. Presents NAD.  Prior to Admission medications   Medication Sig Start Date End Date Taking? Authorizing Provider  cefdinir (OMNICEF) 300 MG capsule Take 1 capsule (300 mg total) by mouth 2 (two) times daily for 7 days. 04/14/23 04/21/23 Yes Gaila Engebretsen P, PA-C  amitriptyline (ELAVIL) 10 MG tablet Take 10 mg by mouth at bedtime.    [provider]  dexlansoprazole (DEXILANT) 60 MG capsule Take 60 mg by mouth daily.    [provider]  diclofenac (CATAFLAM) 50 MG tablet Take 50 mg by mouth 3 (three) times daily.    [provider]  diclofenac Sodium (VOLTAREN) 1 % GEL APPLY TO AFFECTED AREA 3 TIMES A DAY 12/14/19   [provider]  gabapentin (NEURONTIN) 600 MG tablet Take 600 mg by mouth 3 (three) times daily.    [provider]  HYDROcodone-acetaminophen (NORCO/VICODIN) 5-325 MG tablet Take 1 tablet by mouth every 6 (six) hours as needed for moderate pain.    [provider]  ibuprofen (ADVIL) 800 MG tablet  01/07/19   [provider]  lidocaine (LIDODERM) 5 %  PLACE 1 PATCH ON THE SKIN DAILY FOR 30 DAY REMOVE & DISCARD PATCH WITHIN 12 HRS OR AS DIRECTED BY MD 02/15/20   [provider]  losartan-hydrochlorothiazide [name]) 50-12.5 MG tablet Take by mouth. 12/03/19 02/21/23  [provider]  methocarbamol (ROBAXIN) 750 MG tablet Take 750 mg by mouth 4 (four) times daily.    [provider]  predniSONE (DELTASONE) 20 MG tablet Take 3 tabs PO daily x 5 days. 02/21/23   Trevor Iha, FNP  venlafaxine XR (EFFEXOR-XR) 37.5 MG 24 hr capsule TAKE 1 CAPSULE BY MOUTH DAILY WITH BREAKFAST. 04/09/23   Monica Becton, MD     Allergies  Allergen Reactions   Rosuvastatin Itching   Simvastatin Itching   Pregabalin Other (See Comments)    Pt stated, "This made me gain weight; stopped taking"   Amoxicillin-Pot Clavulanate Diarrhea   Cymbalta [Duloxetine Hcl]     Yawning    Doxycycline Cough   Ferrous Sulfate Itching    History:   Past Medical History:  Diagnosis Date   Allergy to mold    Arthritis    Asthma    DERMATITIS, ATOPIC 02/09/2010   Qualifier: Diagnosis of  By: Orson Aloe MD, Tinnie Gens     EDEMA LEG 03/22/2010   Qualifier: Diagnosis of  By: Orson Aloe MD, Tinnie Gens     Fibromyalgia    IBS (irritable bowel syndrome)    Morbid obesity (HCC)      Past Surgical History:  Procedure Laterality Date   CESAREAN SECTION     TONSILLECTOMY  AND ADENOIDECTOMY     WISDOM TOOTH EXTRACTION      Family History  Problem Relation Age of Onset   Diabetes Mother    Cancer Mother        Lung   Hyperlipidemia Father    Breast cancer Maternal Aunt    Ovarian cancer Paternal Aunt     Social History   Tobacco Use   Smoking status: Never   Smokeless tobacco: Never  Vaping Use   Vaping status: Never Used  Substance Use Topics   Alcohol use: No   Drug use: No    Review of Systems  Constitutional:  Negative for fever.  HENT:  Positive for congestion, ear pain, rhinorrhea, sinus pressure and sinus pain. Negative for sore  throat.   Respiratory:  Positive for cough.   Gastrointestinal:  Negative for abdominal pain, nausea and vomiting.  Musculoskeletal:  Positive for myalgias and neck pain.     Objective:   Vitals: BP (!) 167/109 (BP Location: Right Arm)   Pulse 88   Temp 98.9 F (37.2 C) (Oral)   Resp 16   SpO2 98%   Physical Exam Constitutional:      General: She is not in acute distress.    Appearance: Normal appearance. She is well-developed. She is morbidly obese. She is not ill-appearing or toxic-appearing.  HENT:     Head: Normocephalic and atraumatic.     Right Ear: Ear canal normal. A middle ear effusion is present.     Left Ear: Ear canal normal. A middle ear effusion is present.     Ears:     Comments: Slight erythema of the left TM    Nose:     Right Sinus: Maxillary sinus tenderness present.     Left Sinus: Maxillary sinus tenderness present.     Mouth/Throat:     Pharynx: Oropharynx is clear. Uvula midline. No pharyngeal swelling, oropharyngeal exudate or posterior oropharyngeal erythema.     Tonsils: No tonsillar exudate or tonsillar abscesses.  Cardiovascular:     Rate and Rhythm: Normal rate and regular rhythm.     Heart sounds: Normal heart sounds.  Pulmonary:     Effort: Pulmonary effort is normal.     Breath sounds: Normal breath sounds.     Comments: Clear to auscultation bilaterally  Abdominal:     General: Bowel sounds are normal.     Palpations: Abdomen is soft.     Tenderness: There is no abdominal tenderness.  Lymphadenopathy:     Cervical: Cervical adenopathy present.     Right cervical: Superficial cervical adenopathy present.     Left cervical: Superficial cervical adenopathy present.  Skin:    General: Skin is warm and dry.  Neurological:     General: No focal deficit present.     Mental Status: She is alert.  Psychiatric:        Mood and Affect: Mood and affect normal.     Results:  Labs: No results found for this or any previous visit (from the  past 24 hours).  Radiology: No results found.   UC Course/Treatments:  Procedures: Procedures   Medications Ordered in UC: Medications - No data to display   Assessment and Plan :     ICD-10-CM   1. Acute non-recurrent maxillary sinusitis  J01.00     2. Acute cough  R05.1     3. Essential hypertension  I10       Acute non-recurrent maxillary sinusitis Afebrile, nontoxic-appearing, NAD. VSS.  DDX includes but not limited to: COVID, flu, viral URI, sinusitis, allergic rhinitis COVID and flu not ordered given length of symptoms. Cefdinir 300mg  BID was prescribed for ongoing symptoms and facial pain. Patient reports amoxicillin allergy is diarrhea. Strict ED precautions were given and patient verbalized understanding.  Acute cough Afebrile, nontoxic-appearing, NAD. VSS. DDX includes but not limited to: COVID, flu, bronchitis, pneumonia, viral URI COVID and flu not ordered given length of symptoms. Cefdinir 300mg  BID was prescribed for ongoing symptoms and facial pain. Patient reports amoxicillin allergy is diarrhea. She is instructed to continue with current cough medicine. Strict ED precautions were given and patient verbalized understanding.  Essential hypertension Afebrile, nontoxic-appearing, NAD. VSS. Previously diagnosed. BP elevated at today's visit. Suspect secondary to steroid use.  She was instructed to monitor her BP daily and follow up with our office or PCP sooner. Strict ED precautions were given and patient verbalized understanding.   ED Discharge Orders          Ordered    cefdinir (OMNICEF) 300 MG capsule  2 times daily        04/14/23 1345             PDMP not reviewed this encounter.     Cynda Acres, PA-C 04/14/23 1405

## 2023-04-15 ENCOUNTER — Ambulatory Visit: Payer: Federal, State, Local not specified - PPO

## 2023-04-16 ENCOUNTER — Ambulatory Visit: Payer: Federal, State, Local not specified - PPO | Admitting: Sports Medicine

## 2023-04-18 ENCOUNTER — Telehealth: Payer: Self-pay

## 2023-04-18 ENCOUNTER — Telehealth: Payer: Self-pay | Admitting: Internal Medicine

## 2023-04-18 MED ORDER — FLUCONAZOLE 150 MG PO TABS: 150.0000 mg | ORAL_TABLET | ORAL | 0 refills | Status: AC

## 2023-04-18 NOTE — Telephone Encounter (Signed)
 Patient called requesting RX for yeast infection given recent ABX use. RX sent for Diflucan 150mg  every 72 hours.

## 2023-04-27 ENCOUNTER — Ambulatory Visit

## 2023-04-30 ENCOUNTER — Ambulatory Visit (INDEPENDENT_AMBULATORY_CARE_PROVIDER_SITE_OTHER): Payer: Federal, State, Local not specified - PPO | Admitting: Sports Medicine

## 2023-04-30 ENCOUNTER — Ambulatory Visit

## 2023-04-30 DIAGNOSIS — M76891 Other specified enthesopathies of right lower limb, excluding foot: Secondary | ICD-10-CM | POA: Diagnosis not present

## 2023-04-30 DIAGNOSIS — M4802 Spinal stenosis, cervical region: Secondary | ICD-10-CM | POA: Diagnosis not present

## 2023-04-30 MED ORDER — LIDOCAINE 5 % EX PTCH: 1.0000 | MEDICATED_PATCH | CUTANEOUS | 3 refills | Status: AC

## 2023-04-30 MED ORDER — AMITRIPTYLINE HCL 10 MG PO TABS
10.0000 mg | ORAL_TABLET | Freq: Every day | ORAL | 3 refills | Status: AC
Start: 2023-04-30 — End: ?

## 2023-04-30 MED ORDER — VENLAFAXINE HCL ER 37.5 MG PO CP24
37.5000 mg | ORAL_CAPSULE | Freq: Every day | ORAL | 3 refills | Status: DC
Start: 2023-04-30 — End: 2023-06-11

## 2023-04-30 NOTE — Assessment & Plan Note (Signed)
 Previous history: Very pleasant 58 year old female, she has had several weeks of increasing pain right hip, on exam she does not some tenderness over the greater trochanter, on exam she has profound weakness of the right hip to abduction, she cannot hold her right leg up against gravity. She has similar symptoms on the left side although lesser so, she can barely hold left side up against gravity. I explained her the importance of hip abductor retraining to resolve this problem. The diagnosis is trochanteric bursitis but the underlying problem is more of a hip abductor tendinopathy. X-rays today, she is not getting sufficient pain control with her amitriptyline, gabapentin, and tramadol. She does have an appointment coming up with pain management, she was on hydrocodone in the past but I think we should certainly try to maximize our neuropathic treatments, we will add venlafaxine, she did not respond well to Cymbalta in the past.   Update: X-rays did show hip abductor insertional calcification, she has been doing some formal PT and hips are doing a lot better.

## 2023-04-30 NOTE — Assessment & Plan Note (Signed)
 This pleasant 58 year old female returns, she has known C5-C6 spinal stenosis, narrowing down to the C6-C7 level, she had a cervical epidural approximately 3 years ago that only provided minimal relief, gabapentin, amitriptyline, venlafaxine, methocarbamol have been moderately effective. Cataflam also effective. We will get her to do some additional PT if she is having a flare in the pain right side, nothing overtly radicular. Updated x-rays. Return to see me in 6 weeks, MR for interventional planning if not better.

## 2023-04-30 NOTE — Progress Notes (Signed)
    Procedures performed today:    None.  Independent interpretation of notes and tests performed by another provider:   None.  Brief History, Exam, Impression, and Recommendations:    Cervical spinal stenosis This pleasant 58 year old female returns, she has known C5-C6 spinal stenosis, narrowing down to the C6-C7 level, she had a cervical epidural approximately 3 years ago that only provided minimal relief, gabapentin, amitriptyline, venlafaxine, methocarbamol have been moderately effective. Cataflam also effective. We will get her to do some additional PT if she is having a flare in the pain right side, nothing overtly radicular. Updated x-rays. Return to see me in 6 weeks, MR for interventional planning if not better.  Hip abductor tendinitis, right Previous history: Very pleasant 58 year old female, she has had several weeks of increasing pain right hip, on exam she does not some tenderness over the greater trochanter, on exam she has profound weakness of the right hip to abduction, she cannot hold her right leg up against gravity. She has similar symptoms on the left side although lesser so, she can barely hold left side up against gravity. I explained her the importance of hip abductor retraining to resolve this problem. The diagnosis is trochanteric bursitis but the underlying problem is more of a hip abductor tendinopathy. X-rays today, she is not getting sufficient pain control with her amitriptyline, gabapentin, and tramadol. She does have an appointment coming up with pain management, she was on hydrocodone in the past but I think we should certainly try to maximize our neuropathic treatments, we will add venlafaxine, she did not respond well to Cymbalta in the past.   Update: X-rays did show hip abductor insertional calcification, she has been doing some formal PT and hips are doing a lot better.    ____________________________________________ Ihor Austin. Benjamin Stain,  M.D., ABFM., CAQSM., AME. Primary Care and Sports Medicine [facility]  Adjunct Professor of Family Medicine  [employer] [employer]  Restaurant manager, fast food

## 2023-05-10 ENCOUNTER — Ambulatory Visit

## 2023-05-10 ENCOUNTER — Ambulatory Visit
Admission: RE | Admit: 2023-05-10 | Discharge: 2023-05-10 | Disposition: A | Discharge status: Home / Self Care | Source: Ambulatory Visit | Attending: Family Medicine | Admitting: Family Medicine

## 2023-05-10 VITALS — BP 142/91 | HR 114 | Temp 98.6°F | Resp 17

## 2023-05-10 DIAGNOSIS — R059 Cough, unspecified: Secondary | ICD-10-CM

## 2023-05-10 DIAGNOSIS — J069 Acute upper respiratory infection, unspecified: Secondary | ICD-10-CM

## 2023-05-10 MED ORDER — BENZONATATE 200 MG PO CAPS: 200.0000 mg | ORAL_CAPSULE | Freq: Three times a day (TID) | ORAL | 0 refills | Status: AC | PRN

## 2023-05-10 MED ORDER — AZITHROMYCIN 250 MG PO TABS: 250.0000 mg | ORAL_TABLET | Freq: Every day | ORAL | 0 refills | Status: DC

## 2023-05-10 MED ORDER — PREDNISONE 10 MG (21) PO TBPK: ORAL_TABLET | Freq: Every day | ORAL | 0 refills | Status: DC

## 2023-05-10 NOTE — Discharge Instructions (Addendum)
 Advised patient to take medications as directed with food to completion.  Advised may take Tessalon capsules daily or as needed for cough.  Advised patient to hold Zithromax for the next 5 to 6 days.  Advised if symptoms worsen may start this medication-please take to completion.  Encouraged to increase daily water intake to 64 ounces per day while taking these medications.  Advised if symptoms worsen and/or unresolved please follow-up with your PCP or here for further evaluation.

## 2023-05-10 NOTE — ED Triage Notes (Signed)
 Pt c/o cough and chest congestion/tightness x 2 weeks. Denies fever. Was seen in another UC, tx with prednisone and cefdinir. Also taking mucinex and OTC cough meds prn.

## 2023-05-10 NOTE — ED Provider Notes (Signed)
 [name]    CSN: [id number] Arrival date & time: 05/10/23  4098      History   Chief Complaint Chief Complaint  Patient presents with   Cough    HPI Rhonda Oneal is a 58 y.o. female.   HPI 58 year old female presents with cough, chest congestion, and chest tightness for 2 weeks.  Patient reports dyspnea on exertion this morning upon entering clinic this morning.  PMH significant for morbid obesity, fibromyalgia, and IBS.  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   Allergic rhinitis 03/14/2020   Fibromyalgia 03/14/2020   Sciatic nerve pain 03/14/2020   Trigger thumb, left thumb 04/02/2019   Degenerative spondylolisthesis 12/07/2016   Mild intermittent asthma with exacerbation 11/05/2016   Pain of left sacroiliac joint 04/18/2016   Trochanteric bursitis of both hips 03/16/2016   Ischial bursitis 12/15/2015   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   Sickle cell trait (HCC) 01/20/2013   Synovitis of foot 12/26/2012   Arthralgia 12/08/2012   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   Cervical spinal stenosis 11/12/2012   Brachial neuritis 11/12/2012   Left lumbar radiculopathy 05/13/2012   Irritable bowel syndrome 11/29/2011   Morbid obesity (HCC)    ALLERGIC RHINITIS, SEASONAL 06/08/2010   EDEMA LEG 03/22/2010   DERMATITIS, ATOPIC 02/09/2010   Vitamin D deficiency 04/15/2008    Past Surgical History:  Procedure Laterality Date    CESAREAN SECTION     TONSILLECTOMY AND ADENOIDECTOMY     WISDOM TOOTH EXTRACTION      OB History     Gravida  2   Para  2   Term      Preterm      AB      Living         SAB      IAB      Ectopic      Multiple      Live Births               Home Medications    Prior to Admission medications   Medication Sig Start Date End Date Taking? Authorizing Provider  azithromycin (ZITHROMAX) 250 MG tablet Take 1 tablet (250 mg total) by mouth daily. Take first 2 tablets together, then 1 every day until finished. 05/10/23  Yes Trevor Iha, FNP  benzonatate (TESSALON) 200 MG capsule Take 1 capsule (200 mg total) by mouth 3 (three) times daily as needed for up to 7 days. 05/10/23 05/17/23 Yes Trevor Iha, FNP  predniSONE (STERAPRED UNI-PAK 21 TAB) 10 MG (21) TBPK tablet Take by mouth daily. Take 6 tabs by mouth daily  for 2 days, then 5 tabs for 2 days, then 4 tabs for 2 days, then 3 tabs for 2 days, 2 tabs for 2 days, then 1 tab by mouth daily for 2 days 05/10/23  Yes Trevor Iha, FNP  amitriptyline (  ELAVIL) 10 MG tablet Take 1 tablet (10 mg total) by mouth at bedtime. 04/30/23   Monica Becton, MD  dexlansoprazole (DEXILANT) 60 MG capsule Take 60 mg by mouth daily.    [provider]  diclofenac (CATAFLAM) 50 MG tablet Take 50 mg by mouth 3 (three) times daily.    [provider]  diclofenac Sodium (VOLTAREN) 1 % GEL APPLY TO AFFECTED AREA 3 TIMES A DAY 12/14/19   [provider]  gabapentin (NEURONTIN) 600 MG tablet Take 600 mg by mouth 3 (three) times daily.    [provider]  HYDROcodone-acetaminophen (NORCO/VICODIN) 5-325 MG tablet Take 1 tablet by mouth every 6 (six) hours as needed for moderate pain.    [provider]  lidocaine (LIDODERM) 5 % Place 1 patch onto the skin daily. Remove & Discard patch within 12 hours or as directed by MD 04/30/23   Monica Becton, MD  losartan-hydrochlorothiazide [facility])  50-12.5 MG tablet Take by mouth. 12/03/19 02/21/23  [provider]  methocarbamol (ROBAXIN) 750 MG tablet Take 750 mg by mouth 4 (four) times daily.    [provider]  venlafaxine XR (EFFEXOR-XR) 37.5 MG 24 hr capsule Take 1 capsule (37.5 mg total) by mouth daily with breakfast. 04/30/23   Monica Becton, MD    Family History Family History  Problem Relation Age of Onset   Diabetes Mother    Cancer Mother        Lung   Hyperlipidemia Father    Breast cancer Maternal Aunt    Ovarian cancer Paternal Aunt     Social History Social History   Tobacco Use   Smoking status: Never   Smokeless tobacco: Never  Vaping Use   Vaping status: Never Used  Substance Use Topics   Alcohol use: No   Drug use: No     Allergies   Rosuvastatin, Simvastatin, Pregabalin, Amoxicillin-pot clavulanate, Cymbalta [duloxetine hcl], Doxycycline, and Ferrous sulfate   Review of Systems Review of Systems  Constitutional:  Positive for fatigue.  Respiratory:  Positive for cough, chest tightness and shortness of breath.        Chest congestion and chest tightness for 2 weeks     Physical Exam Triage Vital Signs ED Triage Vitals  Encounter Vitals Group     BP      Systolic BP Percentile      Diastolic BP Percentile      Pulse      Resp      Temp      Temp src      SpO2      Weight      Height      Head Circumference      Peak Flow      Pain Score      Pain Loc      Pain Education      Exclude from Growth Chart    No data found.  Updated Vital Signs BP (!) 142/91 (BP Location: Right Arm)   Pulse (!) 114   Temp 98.6 F (37 C) (Oral)   Resp 17   LMP 10/10/2020 (Approximate)   SpO2 97%    Physical Exam Vitals and nursing note reviewed.  Constitutional:      Appearance: Normal appearance. She is obese. She is ill-appearing.  HENT:     Head: Normocephalic and atraumatic.     Right Ear: Tympanic membrane, ear canal and external ear normal.     Left Ear:  Tympanic membrane, ear canal and external ear normal.     Mouth/Throat:     Mouth: Mucous membranes are moist.     Pharynx: Oropharynx is clear.  Eyes:     Extraocular Movements: Extraocular movements intact.     Conjunctiva/sclera: Conjunctivae normal.     Pupils: Pupils are equal, round, and reactive to light.  Cardiovascular:     Rate and Rhythm: Normal rate and regular rhythm.     Pulses: Normal pulses.     Heart sounds: Normal heart sounds.  Pulmonary:     Effort: Pulmonary effort is normal.     Breath sounds: Normal breath sounds. No wheezing, rhonchi or rales.     Comments: Frequent nonproductive cough on exam Musculoskeletal:        General: Normal range of motion.     Cervical back: Normal range of motion and neck supple.  Skin:    General: Skin is warm and dry.  Neurological:     General: No focal deficit present.     Mental Status: She is alert and oriented to person, place, and time. Mental status is at baseline.  Psychiatric:        Mood and Affect: Mood normal.        Behavior: Behavior normal.      UC Treatments / Results  Labs (all labs ordered are listed, but only abnormal results are displayed) Labs Reviewed - No data to display  EKG   Radiology DG Chest 2 View Result Date: 05/10/2023 CLINICAL DATA:  Cough for 2 weeks. EXAM: CHEST - 2 VIEW COMPARISON:  X-ray 04/30/2019 FINDINGS: No consolidation, pneumothorax or effusion. No edema. Normal cardiopericardial silhouette. Degenerative changes of spine. Pectus excavatum. Air-fluid level along the stomach beneath the left hemidiaphragm. IMPRESSION: No acute cardiopulmonary disease. Electronically Signed   By: Karen Kays M.D.   On: 05/10/2023 10:40    Procedures Procedures (including critical care time)  Medications Ordered in UC Medications - No data to display  Initial Impression / Assessment and Plan / UC Course  I have reviewed the triage vital signs and the nursing notes.  Pertinent labs &  imaging results that were available during my care of the patient were reviewed by me and considered in my medical decision making (see chart for details).     MDM: 1.  Cough, unspecified type CXR results revealed above, Rx'd Sterapred Unipak 21 tab 10 mg, Rx'd Tessalon 200 mg capsules: Take 1 capsule 3 times daily, as needed; 2.  Upper respiratory tract infection, unspecified type-Rx'd Zithromax (500 mg day 1, then 250 mg day 2-5). Advised patient to take medications as directed with food to completion.  Advised may take Tessalon capsules daily or as needed for cough.  Advised patient to hold Zithromax for the next 5 to 6 days.  Advised if symptoms worsen may start this medication-please take to completion.  Encouraged to increase daily water intake to 64 ounces per day while taking these medications.  Advised if symptoms worsen and/or unresolved please follow-up with your PCP or here for further evaluation.  Patient discharged home, hemodynamically stable. Final Clinical Impressions(s) / UC Diagnoses   Final diagnoses:  Cough, unspecified type  Upper respiratory tract infection, unspecified type     Discharge Instructions      Advised patient to take medications as directed with food to completion.  Advised may take Tessalon capsules daily or as needed for cough.  Advised patient to hold Zithromax for the next 5 to 6  days.  Advised if symptoms worsen may start this medication-please take to completion.  Encouraged to increase daily water intake to 64 ounces per day while taking these medications.  Advised if symptoms worsen and/or unresolved please follow-up with your PCP or here for further evaluation.     ED Prescriptions     Medication Sig Dispense Auth. Provider   azithromycin (ZITHROMAX) 250 MG tablet Take 1 tablet (250 mg total) by mouth daily. Take first 2 tablets together, then 1 every day until finished. 6 tablet Trevor Iha, FNP   predniSONE (STERAPRED UNI-PAK 21 TAB) 10 MG  (21) TBPK tablet Take by mouth daily. Take 6 tabs by mouth daily  for 2 days, then 5 tabs for 2 days, then 4 tabs for 2 days, then 3 tabs for 2 days, 2 tabs for 2 days, then 1 tab by mouth daily for 2 days 42 tablet Trevor Iha, FNP   benzonatate (TESSALON) 200 MG capsule Take 1 capsule (200 mg total) by mouth 3 (three) times daily as needed for up to 7 days. 40 capsule Trevor Iha, FNP      PDMP not reviewed this encounter.   Trevor Iha, FNP 05/10/23 1128

## 2023-06-11 ENCOUNTER — Encounter: Payer: Self-pay | Admitting: Sports Medicine

## 2023-06-11 ENCOUNTER — Ambulatory Visit (INDEPENDENT_AMBULATORY_CARE_PROVIDER_SITE_OTHER): Admitting: Sports Medicine

## 2023-06-11 DIAGNOSIS — M76891 Other specified enthesopathies of right lower limb, excluding foot: Secondary | ICD-10-CM

## 2023-06-11 DIAGNOSIS — M4802 Spinal stenosis, cervical region: Secondary | ICD-10-CM | POA: Diagnosis not present

## 2023-06-11 MED ORDER — VENLAFAXINE HCL ER 37.5 MG PO CP24
37.5000 mg | ORAL_CAPSULE | Freq: Every day | ORAL | 3 refills | Status: AC
Start: 2023-06-11 — End: ?

## 2023-06-11 NOTE — Progress Notes (Signed)
    Procedures performed today:    None.  Independent interpretation of notes and tests performed by another provider:   None.  Brief History, Exam, Impression, and Recommendations:    Cervical spinal stenosis This pleasant 58 year old female returns, known C5-C6 stenosis with narrowing down to the C6-C7 level, she has cervical epidural that provided some relief, this was about 3 to 4 years ago, gabapentin , amitriptyline , venlafaxine , methocarbamol have all been moderately effective, Cataflam also moderately effective, I saw her early in March, we had her do some physical therapy, unfortunately symptoms have not improved considerably, her MRI is about 56 years old so we will get a new MRI and proceed with a cervical epidural. She would like me to go ahead and order the epidural soon as I see the MRI so I will order the epidural now.    ____________________________________________ Joselyn Nicely. Sandy Crumb, M.D., ABFM., CAQSM., AME. Primary Care and Sports Medicine [facility]  Adjunct Professor of [facility] Medicine  [employer]  Restaurant manager, fast food

## 2023-06-11 NOTE — Assessment & Plan Note (Signed)
 This pleasant 58 year old female returns, known C5-C6 stenosis with narrowing down to the C6-C7 level, she has cervical epidural that provided some relief, this was about 3 to 4 years ago, gabapentin , amitriptyline , venlafaxine , methocarbamol have all been moderately effective, Cataflam also moderately effective, I saw her early in March, we had her do some physical therapy, unfortunately symptoms have not improved considerably, her MRI is about 56 years old so we will get a new MRI and proceed with a cervical epidural. She would like me to go ahead and order the epidural soon as I see the MRI so I will order the epidural now.

## 2023-06-19 NOTE — Progress Notes (Signed)
 Physical Therapy Visit - Daily Note   Payor: BCBS / Plan: BCBS FEDERAL EMPLOYEE / Product Type: PPO /   Visit Count: 4  Rehabilitation Precautions/Restrictions:   Precautions/Restrictions Precautions: Fibromylagia Restrictions: None    Referring Diagnosis: M25.50 (ICD-10-CM) - Polyarthralgia  R53.83 (ICD-10-CM) - Fatigue, unspecified type   SUBJECTIVE Patient Report:   Patient returns reporting that her R shoulder pain was increased after her last PT visit. She would like to reduce the intensity of today's exercises.  Pain:  Pain Assessment Pain Assessment: 0-10 Pain Location:  (upper trap) Pain Orientation: Right  OBJECTIVE  General Observation/Objective Measures: Patient presents to clinic alone, in good spirits, and in NAD.  Interventions:   Therapeutic Exercise: Exercises/education to work on mobility, ROM/flexibility, strength, endurance. Supine shoulder press with cane; 2x20, 5lb AW Supine shoulder alphabet: x2 Supine bent knee fallout: 2x20, green theraband Wall circles: 3x20s Lateral Step Up with Counter Support: 2x10 each side, 6 inch step Single shoulder shrug: 2x15, focusing on stretch  Manual Therapy: To work on improving mobility, normalizing mechanics, and decreasing pain.   -STM to BIL suboccipitals, cervical paraspinals, levator scap  -Cervical traction to tissue resistance, 20 degrees of flexion  Modalities Skin Integrity Assessment:   Non Applicable  Education:  Yes, as described in interventions   ASSESSMENT Patient has made slow progress in therapy so far.They continue to present with R sided upper trap soreness/pain. Today's session focused on reducing overall load to address previously mentioned deficits. Patient tolerated the session well reporting minimal to no amount of pain during. Pt would benefit from continued skilled PT to improve current impairments and increase ease with ADLs/Functional mobility. Will continue to progress as  tolerated.    Therapy Diagnosis:     ICD-10-CM   1. Pain in right hip  M25.551   2. Chronic right shoulder pain  M25.511, G89.29   3. Polyarthralgia  M25.50     Progress Towards Goals:     Goals     . OT Goal     Short Term Goals (STG) - Time Frame 3 visits    STG 1: pt will be 100% compliant with HEP  05/09/23- not addressed at this time, will provide HEP next session 05/23/23- not address due to continued pain, however adequate for DC  Long Term Goals (LTG) - Time Frame 6 visits   LTG 1: pt will report decreased under 2/10 pain at rest.  05/09/23- progressing, 4/10 at end of session. 05/23/23- progressing 3/10, adequate for DC   LTG 2: pt will identify at least 3 adaptive equipment/strategies for joint protection and energy conservation.  05/09/23- progressing able to identify 2/3 adaptive strategies.  05/23/23- GOAL MET      . PT GOALS     Short Term Goals (STG) - Time Frame 3 visits  STG 1: Patient will be independent with HEP to demonstrate adequate intensity of therapeutic exercise at home.   STG 2: Patient will demonstrate less than 2/10 pain when performing HEP.  Long Term Goals (LTG) - Time Frame per POC  LTG 1: Patient will improve LEFS by 15% to demonstrate reduced disability as a result of their symptoms.   LTG 2: Patient will demonstrate pain R hip AROM in all movement planes.   LTG 3: Patient will self report greater than 75% improvement in their symptoms compared to the initial evaluation.   LTG 4: Patient will be able to stand on R leg with pain in R hip.   LTG 5:  Patient will improve quickDASH by 15% to demonstrate reduced disability as a result of their symptoms.           PLAN Treatment Frequency and Duration:  PT Frequency: 1x/week (6 visits) Treatment Plan Details: 7 total per POC  Recommended PT Treatment/Interventions: Aquatic therapy (02886); ADL skills [phone]); Dry needling (1-2 muscles) (79439); Dry Needling (3+ muscles) (79438); Manual  therapy (97140); Neuromuscular re-education [phone]); Therapeutic activity (97530); Therapeutic exercise [phone])   Recommended Consults:  None currently  Development of Plan of Care:  No change in POC.  Total Treatment Time (Time & Untimed): Total Treatment minutes: 40 Total Time in Timed Codes: Timed Code Minutes: 40     PT Treatment/Procedure Therapeutic Exercises minutes: 30 Manual Therapy Minutes: 10            The patient has been instructed to contact our clinic if any questions or problems should arise.

## 2023-06-22 ENCOUNTER — Other Ambulatory Visit

## 2023-06-30 ENCOUNTER — Ambulatory Visit
Admission: RE | Admit: 2023-06-30 | Discharge: 2023-06-30 | Disposition: A | Discharge status: Home / Self Care | Source: Ambulatory Visit | Attending: Sports Medicine | Admitting: Sports Medicine

## 2023-06-30 DIAGNOSIS — M4802 Spinal stenosis, cervical region: Secondary | ICD-10-CM

## 2023-07-09 ENCOUNTER — Ambulatory Visit: Admitting: Sports Medicine

## 2023-07-11 ENCOUNTER — Ambulatory Visit (INDEPENDENT_AMBULATORY_CARE_PROVIDER_SITE_OTHER): Admitting: Sports Medicine

## 2023-07-11 ENCOUNTER — Encounter: Payer: Self-pay | Admitting: Sports Medicine

## 2023-07-11 DIAGNOSIS — M4802 Spinal stenosis, cervical region: Secondary | ICD-10-CM | POA: Diagnosis not present

## 2023-07-11 DIAGNOSIS — M67874 Other specified disorders of tendon, left ankle and foot: Secondary | ICD-10-CM | POA: Diagnosis not present

## 2023-07-11 NOTE — Assessment & Plan Note (Signed)
 Known cervical spinal stenosis at multiple levels, cervical epidural 3 to 4 years ago did provide some relief, gabapentin , amitriptyline , venlafaxine , methocarbamol all moderately effective, Cataflam also moderately effective, we recently saw her in April, she did some physical therapy, but unfortunately symptoms did not improve. We did obtain a new MRI and we also proceeded with cervical epidural. A C7-T1 interlaminar epidural was done on 13th of this month with Dr. Vonn Guard at [facility] [place]. She has noted some improvement. 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 As she is improving from a pain standpoint but she does have a positive Hoffmann sign on the right I do think we need urgent neurosurgical consultation. I would like Dr. Jackee Marus to weigh in.

## 2023-07-11 NOTE — Progress Notes (Signed)
    Procedures performed today:    None.  Independent interpretation of notes and tests performed by another provider:   Official report not back yet but I did personally review the cervical spine MRI, there does appear to be an area of T2 signal/myelomalacia at the C4-C5 level, there is mild to moderate central canal stenosis with indentation of the thecal sac at this level.  Brief History, Exam, Impression, and Recommendations:    Achilles tendinosis of left ankle Recent onset left posterior heel pain after doing some walking for graduation ceremony. On exam there is no mid Achilles nodule, there is tenderness at the Achilles insertion itself. Negative Thompson's test. We discussed the anatomy and pathophysiology, she will do a boot for 2 weeks, eccentric Achilles rehab. Return to see me in approximately 4 weeks.  Cervical spinal stenosis Known cervical spinal stenosis at multiple levels, cervical epidural 3 to 4 years ago did provide some relief, gabapentin , amitriptyline , venlafaxine , methocarbamol all moderately effective, Cataflam also moderately effective, we recently saw her in April, she did some physical therapy, but unfortunately symptoms did not improve. We did obtain a new MRI and we also proceeded with cervical epidural. A C7-T1 interlaminar epidural was done on 13th of this month with Dr. Vonn Guard at [facility] [place]. She has noted some improvement. 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 As she is improving from a pain standpoint but she does have a positive Hoffmann sign on the right I do think we need urgent neurosurgical consultation. I would like Dr. Jackee Marus to weigh in.    ____________________________________________ Joselyn Nicely. Sandy Crumb, M.D., ABFM., CAQSM., AME. Primary Care and Sports Medicine [facility]  [facility]  Adjunct Professor of [facility] Medicine  [employer]  Restaurant manager, fast food

## 2023-07-11 NOTE — Assessment & Plan Note (Signed)
 Recent onset left posterior heel pain after doing some walking for graduation ceremony. On exam there is no mid Achilles nodule, there is tenderness at the Achilles insertion itself. Negative Thompson's test. We discussed the anatomy and pathophysiology, she will do a boot for 2 weeks, eccentric Achilles rehab. Return to see me in approximately 4 weeks.

## 2023-07-11 NOTE — Patient Instructions (Signed)
 Begin with easy walking, heel, toe and backwards  Do calf raises on a step:  First lower and then raise on 1 foot  If this is painful, lower on 1 foot, do the heel raise on both feet Begin with 3 sets of 10 repetitions  Increase by 5 repetitions every 3 days  Goal is 3 sets of 30 repetitions  Do with both straight knee and knee at 20 degrees of flexion  If pain persists, once you can do 3 sets of 30 without weight, add backpack with 5 lbs.  Increase by 5 lbs per week to max of 30 lbs for 3 sets of 15

## 2023-07-19 ENCOUNTER — Encounter: Payer: Self-pay | Admitting: Podiatry

## 2023-07-19 ENCOUNTER — Ambulatory Visit (INDEPENDENT_AMBULATORY_CARE_PROVIDER_SITE_OTHER): Admitting: Podiatry

## 2023-07-19 DIAGNOSIS — M7662 Achilles tendinitis, left leg: Secondary | ICD-10-CM | POA: Diagnosis not present

## 2023-07-19 NOTE — Progress Notes (Signed)
  Subjective:  Patient ID: Rhonda Oneal, female    DOB: 1965-11-10,   MRN: [id number]  Chief Complaint  Patient presents with   Bursitis    "It's doing a little bit better, I guess."    58 y.o. female presents for concern of left achilles tendon pain. She was recently seen by Dr. Elva Hamburger and given a boot and told to do some exercises.  She does relate some improvement in the achilles in the last several days. About 50% better.   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   Denies any other pedal complaints. Denies n/v/f/c.   Past Medical History:  Diagnosis Date   Allergy to mold    Arthritis    Asthma    DERMATITIS, ATOPIC 02/09/2010   Qualifier: Diagnosis of  By: Teofilo Fellers MD, Susana Enter     EDEMA LEG 03/22/2010   Qualifier: Diagnosis of  By: Teofilo Fellers MD, Susana Enter     Fibromyalgia    IBS (irritable bowel syndrome)    Morbid obesity (HCC)     Objective:  Physical Exam: Vascular: DP/PT pulses 2/4 bilateral. CFT <3 seconds. Normal hair growth on digits. No edema.  Skin. No lacerations or abrasions bilateral feet.  Musculoskeletal: MMT 5/5 bilateral lower extremities in DF, PF, Inversion and Eversion. Deceased ROM in DF of ankle joint.. Tender to distal achilles insertion site and some pain proximally along the tendon. No palpable delve negative thompson test. Tendon intact.   Neurological: Sensation intact to light touch.   Assessment:   1. Tendonitis, Achilles, left        Plan:  Patient was evaluated and treated and all questions answered. Discussed plantar fasciitis and pes planus with patient.  X-rays reviewed  from previous and discussed with patient. No acute fractures or dislocations noted. Mild spurring noted at inferior calcaneus and posterior calcaneus on left.  -Discussed Achilles insertional tendonitis and treatment options with patient.  -Continue boot for another week than may transition out.   -ibuprofen or tylenol  as needed.  -Discussed if no improvement will consider MRI/PT/EPAT/PRP injections.  -Patient to return to office as needed or sooner if condition worsens.    Jennefer Moats, DPM

## 2023-07-26 ENCOUNTER — Telehealth: Payer: Self-pay

## 2023-07-26 NOTE — Telephone Encounter (Signed)
 Copied from CRM [id number]. Topic: Clinical - Lab/Test Results >> Jul 26, 2023  9:31 AM Rhonda Oneal wrote: Reason for CRM: Patient is calling about her MRI results, provider already explained what was going on to the patient but its not available in her MyChart just yet, can you assist with this?

## 2023-07-26 NOTE — Telephone Encounter (Signed)
 Patient advised the MRI has not resulted.

## 2023-07-29 ENCOUNTER — Ambulatory Visit: Payer: Self-pay | Admitting: Sports Medicine

## 2023-08-01 ENCOUNTER — Ambulatory Visit: Admitting: Podiatry

## 2023-08-14 ENCOUNTER — Ambulatory Visit
Admission: RE | Admit: 2023-08-14 | Discharge: 2023-08-14 | Disposition: A | Discharge status: Home / Self Care | Source: Ambulatory Visit | Attending: Family Medicine | Admitting: Family Medicine

## 2023-08-14 VITALS — BP 138/90 | HR 106 | Temp 98.4°F | Resp 18

## 2023-08-14 DIAGNOSIS — G8929 Other chronic pain: Secondary | ICD-10-CM

## 2023-08-14 DIAGNOSIS — M545 Low back pain, unspecified: Secondary | ICD-10-CM

## 2023-08-14 DIAGNOSIS — Z9109 Other allergy status, other than to drugs and biological substances: Secondary | ICD-10-CM | POA: Diagnosis not present

## 2023-08-14 DIAGNOSIS — J0100 Acute maxillary sinusitis, unspecified: Secondary | ICD-10-CM

## 2023-08-14 LAB — POCT URINALYSIS DIP (MANUAL ENTRY)
Bilirubin, UA: NEGATIVE
Blood, UA: NEGATIVE
Glucose, UA: NEGATIVE mg/dL
Ketones, POC UA: NEGATIVE mg/dL
Leukocytes, UA: NEGATIVE
Nitrite, UA: NEGATIVE
Protein Ur, POC: NEGATIVE mg/dL
Spec Grav, UA: 1.02 (ref 1.010–1.025)
Urobilinogen, UA: 0.2 U/dL
pH, UA: 5.5 (ref 5.0–8.0)

## 2023-08-14 MED ORDER — CEFDINIR 300 MG PO CAPS: 300.0000 mg | ORAL_CAPSULE | Freq: Two times a day (BID) | ORAL | 0 refills | Status: DC

## 2023-08-14 MED ORDER — ASPIRIN 325 MG PO TABS: 325.0000 mg | ORAL_TABLET | Freq: Every day | ORAL | Status: DC

## 2023-08-14 NOTE — ED Triage Notes (Signed)
 Pt states that she has low back pain and urine urgency X 1 week. She has been drinking cranberry juice. She isnt sure if her back pain is due to going to the gym she gets an epidural shot after this visit.   She states she has facial pressure and bilateral ear pain, congestion X 1 week. She has been taking zyrtec and nasal spray. Headache when looking up

## 2023-08-14 NOTE — Discharge Instructions (Signed)
 Continue the Zyrtec and Flonase  Drink lots of fluids Take cefdinir  2 times a day  Follow-up if you fail to improve

## 2023-08-14 NOTE — ED Provider Notes (Signed)
 Rhonda Oneal    CSN: [id number] Arrival date & time: 08/14/23  0827      History   Chief Complaint Chief Complaint  Patient presents with   URI    Entered by patient   Back Pain   Urinary Frequency    HPI Rhonda Oneal is a 58 y.o. female.   Patient is here with a couple of complaints.  She has had an increase in her low back pain.  She also has had some urinary frequency.  She is worried about a urinary tract or kidney infection.  She states she has been pushing fluids and drinking cranberry juice.  She is scheduled later today to have an epidural injection for her chronic low back condition.  States she also has fibromyalgia. She also has a sinus infection.  Sinus congestion and drainage, headache, postnasal drip for over a week.  She is already using Zyrtec, Flonase , and Nettie pot rinses.  In spite of this she has sinus congestion and foul drainage.  Has underlying allergies.    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   Allergic rhinitis 03/14/2020   Fibromyalgia 03/14/2020   Sciatic nerve pain 03/14/2020   Trigger thumb, left thumb 04/02/2019   Degenerative spondylolisthesis 12/07/2016   Mild intermittent asthma with exacerbation 11/05/2016   Pain of left sacroiliac joint 04/18/2016   Trochanteric bursitis of both hips 03/16/2016   Ischial bursitis 12/15/2015   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   Sickle cell trait (HCC) 01/20/2013   Synovitis of foot 12/26/2012   Arthralgia 12/08/2012   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   Cervical spinal stenosis 11/12/2012   Brachial neuritis 11/12/2012   Left lumbar radiculopathy 05/13/2012   Irritable bowel syndrome 11/29/2011   Morbid obesity (HCC)    ALLERGIC RHINITIS, SEASONAL 06/08/2010   EDEMA LEG 03/22/2010   DERMATITIS, ATOPIC 02/09/2010   Vitamin D deficiency 04/15/2008    Past Surgical History:  Procedure Laterality Date   CESAREAN SECTION     TONSILLECTOMY AND ADENOIDECTOMY     WISDOM TOOTH EXTRACTION      OB History     Gravida  2   Para  2   Term      Preterm      AB      Living         SAB      IAB      Ectopic      Multiple      Live Births               Home Medications    Prior to Admission medications   Medication Sig Start Date End Date Taking? Authorizing Provider  amitriptyline  (ELAVIL ) 10 MG tablet Take 1 tablet (10 mg total) by mouth at bedtime. 04/30/23  Yes Curtis Debby PARAS,  MD  cefdinir  (OMNICEF ) 300 MG capsule Take 1 capsule (300 mg total) by mouth 2 (two) times daily. 08/14/23  Yes Maranda Jamee Jacob, MD  cyclobenzaprine  (FLEXERIL ) 5 MG tablet Take 5 mg by mouth as needed for muscle spasms. 06/04/23  Yes [provider]  dexlansoprazole  (DEXILANT ) 60 MG capsule Take 60 mg by mouth daily.   Yes [provider]  diclofenac  (CATAFLAM) 50 MG tablet Take 50 mg by mouth 3 (three) times daily.   Yes [provider]  diclofenac  Sodium (VOLTAREN ) 1 % GEL APPLY TO AFFECTED AREA 3 TIMES A DAY 12/14/19  Yes [provider]  gabapentin  (NEURONTIN ) 600 MG tablet Take 600 mg by mouth 3 (three) times daily.   Yes [provider]  HYDROcodone -acetaminophen  (NORCO/VICODIN) 5-325 MG tablet Take 1 tablet by mouth every 6 (six) hours as needed for moderate pain.   Yes [provider]  methocarbamol (ROBAXIN) 750 MG  tablet Take 750 mg by mouth 4 (four) times daily.   Yes [provider]  venlafaxine  XR (EFFEXOR -XR) 37.5 MG 24 hr capsule Take 1 capsule (37.5 mg total) by mouth daily with breakfast. 06/11/23  Yes Curtis Debby PARAS, MD  lidocaine  (LIDODERM ) 5 % Place 1 patch onto the skin daily. Remove & Discard patch within 12 hours or as directed by MD 04/30/23   Curtis Debby PARAS, MD  losartan-hydrochlorothiazide [facility]) 50-12.5 MG tablet Take by mouth. 12/03/19 02/21/23  [provider]    Family History Family History  Problem Relation Age of Onset   Diabetes Mother    Cancer Mother        Lung   Hyperlipidemia Father    Breast cancer Maternal Aunt    Ovarian cancer Paternal Aunt     Social History Social History   Tobacco Use   Smoking status: Never   Smokeless tobacco: Never  Vaping Use   Vaping status: Never Used  Substance Use Topics   Alcohol use: No   Drug use: No     Allergies   Rosuvastatin, Simvastatin, Pregabalin , Amoxicillin -pot clavulanate, Cymbalta [duloxetine hcl], Doxycycline , and Ferrous sulfate   Review of Systems Review of Systems See HPI  Physical Exam Triage Vital Signs ED Triage Vitals  Encounter Vitals Group     BP 08/14/23 0840 (!) 138/90     Girls Systolic BP Percentile --      Girls Diastolic BP Percentile --      Boys Systolic BP Percentile --      Boys Diastolic BP Percentile --      Pulse Rate 08/14/23 0840 (!) 106     Resp 08/14/23 0840 18     Temp 08/14/23 0840 98.4 F (36.9 C)     Temp Source 08/14/23 0840 Oral     SpO2 08/14/23 0840 99 %     Weight --      Height --      Head Circumference --      Peak Flow --      Pain Score 08/14/23 0837 8     Pain Loc --      Pain Education --      Exclude from Growth Chart --    No data found.  Updated Vital Signs BP (!) 138/90 (BP Location: Right Arm)   Pulse (!) 106   Temp 98.4 F (36.9 C) (Oral)   Resp 18   LMP 10/10/2020 (Approximate)   SpO2 99%        Physical Exam Constitutional:  General: She is not in acute distress.    Appearance: She is well-developed. She is obese. She is ill-appearing.  HENT:     Head: Normocephalic and atraumatic.     Ears:     Comments: Both TMs are injected and dull    Nose: Congestion and rhinorrhea present.     Comments: Nasal membranes are swollen and red    Mouth/Throat:     Mouth: Mucous membranes are moist.     Pharynx: Posterior oropharyngeal erythema present.     Comments: Tonsils surgically removed.  Posterior pharynx erythematous.  Facial sinuses tender  Eyes:     Conjunctiva/sclera: Conjunctivae normal.     Pupils: Pupils are equal, round, and reactive to light.    Cardiovascular:     Rate and Rhythm: Normal rate and regular rhythm.     Heart sounds: Normal heart sounds.  Pulmonary:     Effort: Pulmonary effort is normal. No respiratory distress.   Musculoskeletal:        General: Normal range of motion.     Cervical back: Normal range of motion.  Lymphadenopathy:     Cervical: No cervical adenopathy.   Skin:    General: Skin is warm and dry.   Neurological:     Mental Status: She is alert.      UC Treatments / Results  Labs (all labs ordered are listed, but only abnormal results are displayed) Labs Reviewed  POCT URINALYSIS DIP (MANUAL ENTRY) - Normal    EKG   Radiology No results found.  Procedures Procedures (including critical Oneal time)  Medications Ordered in UC Medications - No data to display  Initial Impression / Assessment and Plan / UC Course  I have reviewed the triage vital signs and the nursing notes.  Pertinent labs & imaging results that were available during my Oneal of the patient were reviewed by me and considered in my medical decision making (see chart for details).     Urinalysis is negative.  Reassured patient that she does not have a kidney infection.  Chronic low back pain to be managed by her usual provider For the sinus  infection we will give her cefdinir .  Continue Nettie Potts and current treatment Final Clinical Impressions(s) / UC Diagnoses   Final diagnoses:  Chronic bilateral low back pain without sciatica  Acute maxillary sinusitis, recurrence not specified  Environmental allergies     Discharge Instructions      Continue the Zyrtec and Flonase  Drink lots of fluids Take cefdinir  2 times a day  Follow-up if you fail to improve   ED Prescriptions     Medication Sig Dispense Auth. Provider   cefdinir  (OMNICEF ) 300 MG capsule Take 1 capsule (300 mg total) by mouth 2 (two) times daily. 20 capsule Maranda Jamee Jacob, MD      PDMP not reviewed this encounter.   Maranda Jamee Jacob, MD 08/14/23 [phone]

## 2023-08-22 ENCOUNTER — Encounter: Payer: Self-pay | Admitting: Podiatry

## 2023-08-22 ENCOUNTER — Ambulatory Visit: Admitting: Podiatry

## 2023-08-22 DIAGNOSIS — M7662 Achilles tendinitis, left leg: Secondary | ICD-10-CM | POA: Diagnosis not present

## 2023-08-22 MED ORDER — METHYLPREDNISOLONE 4 MG PO TBPK: ORAL_TABLET | ORAL | 0 refills | Status: DC

## 2023-08-22 MED ORDER — MELOXICAM 15 MG PO TABS
15.0000 mg | ORAL_TABLET | Freq: Every day | ORAL | 0 refills | Status: DC
Start: 2023-08-22 — End: 2023-11-19

## 2023-08-22 NOTE — Progress Notes (Addendum)
  Subjective:  Patient ID: Rhonda Oneal, female    DOB: 02-26-1965,   MRN: [id number]  Chief Complaint  Patient presents with   Tendonitis    It's doing better.    58 y.o. female presents for follow-up of left achilles tendon. She relates it is doing better but she is going on a trip to [place] soon and worried about how much walking she will do and flaring up her achilles tendon. It is still not 100% she has been stretching.  Denies any other pedal complaints. Denies n/v/f/c.   Past Medical History:  Diagnosis Date   Allergy to mold    Arthritis    Asthma    DERMATITIS, ATOPIC 02/09/2010   Qualifier: Diagnosis of  By: Charlott MD, Reyes     EDEMA LEG 03/22/2010   Qualifier: Diagnosis of  By: Charlott MD, Reyes     Fibromyalgia    IBS (irritable bowel syndrome)    Morbid obesity (HCC)     Objective:  Physical Exam: Vascular: DP/PT pulses 2/4 bilateral. CFT <3 seconds. Normal hair growth on digits. No edema.  Skin. No lacerations or abrasions bilateral feet.  Musculoskeletal: MMT 5/5 bilateral lower extremities in DF, PF, Inversion and Eversion. Deceased ROM in DF of ankle joint.. Tender to distal achilles insertion site and some pain proximally along the tendon. No palpable delve negative thompson test. Tendon intact.   Neurological: Sensation intact to light touch.   Assessment:   1. Tendonitis, Achilles, left         Plan:  Patient was evaluated and treated and all questions answered. Discussed plantar fasciitis and pes planus with patient.  X-rays reviewed  from previous and discussed with patient. No acute fractures or dislocations noted. Mild spurring noted at inferior calcaneus and posterior calcaneus on left.  -Discussed Achilles insertional tendonitis and treatment options with patient.  -Meloxicam  refilled to be taken daily.  -Medrol  dose pack sent as well to only be taken if needed as she is going on a trip soon with a lot of walking to [place].   -Advised on wearing ankle brace she has at home.  -Discussed if no improvement will consider MRI/PT/EPAT/PRP injections.  -Patient to return to office as needed or sooner if condition worsens.    Asberry Failing, DPM

## 2023-10-08 ENCOUNTER — Ambulatory Visit: Admitting: Sports Medicine

## 2023-10-22 ENCOUNTER — Encounter: Payer: Self-pay | Admitting: Sports Medicine

## 2023-11-06 ENCOUNTER — Ambulatory Visit (INDEPENDENT_AMBULATORY_CARE_PROVIDER_SITE_OTHER)

## 2023-11-06 ENCOUNTER — Ambulatory Visit
Admission: EM | Admit: 2023-11-06 | Discharge: 2023-11-06 | Disposition: A | Discharge status: Home / Self Care | Attending: Family Medicine | Admitting: Family Medicine

## 2023-11-06 ENCOUNTER — Encounter: Payer: Self-pay | Admitting: Emergency Medicine

## 2023-11-06 DIAGNOSIS — M222X1 Patellofemoral disorders, right knee: Secondary | ICD-10-CM

## 2023-11-06 DIAGNOSIS — M25572 Pain in left ankle and joints of left foot: Secondary | ICD-10-CM

## 2023-11-06 DIAGNOSIS — M222X2 Patellofemoral disorders, left knee: Secondary | ICD-10-CM

## 2023-11-06 DIAGNOSIS — S83002A Unspecified subluxation of left patella, initial encounter: Secondary | ICD-10-CM

## 2023-11-06 NOTE — Discharge Instructions (Signed)
 Take meloxicam  daily Wear brace as directed Start physical therapy when scheduled Consider sports medicine orthopedic and follow-up

## 2023-11-06 NOTE — ED Provider Notes (Signed)
 Rhonda Oneal    CSN: [id number] Arrival date & time: 11/06/23  0906      History   Chief Complaint Chief Complaint  Patient presents with   Knee Pain    HPI Rhonda Oneal is a 58 y.o. female.   HPI  Patient states she has bilateral knee pain.  She states the left knee is currently worse than the right knee.  Both knees have similar symptoms.  She states when she puts her weight on the leg and twists a certain motion her kneecap will pop out of place.  She states that it looks deformed.  It then popped back in.  It severely painful.  She has had no accident injury.  She has had falls.  She went to an orthopedic office on 11/04/2023.  They did not do any x-rays, told her she had patellofemoral syndrome and recommended physical therapy.  Physical therapy does not start till week after next.  She is dissatisfied that the exam was not thorough, and that she did not get advice about how to keep from falling and now, did not get advice regarding bracing, and that x-rays were not done. She also has history of recent injury to her left foot.  States that she was getting onto a plane, and that there was a gap in the walkway as she entered the airplane.  She states that her foot went down in between 2 surfaces and twisted.  She has had pain in her foot ever since.  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   Allergic rhinitis  03/14/2020   Fibromyalgia 03/14/2020   Sciatic nerve pain 03/14/2020   Trigger thumb, left thumb 04/02/2019   Degenerative spondylolisthesis 12/07/2016   Mild intermittent asthma with exacerbation 11/05/2016   Pain of left sacroiliac joint 04/18/2016   Trochanteric bursitis of both hips 03/16/2016   Ischial bursitis 12/15/2015   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   Sickle cell trait (HCC) 01/20/2013   Synovitis of foot 12/26/2012   Arthralgia 12/08/2012   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   Cervical spinal stenosis 11/12/2012   Brachial neuritis 11/12/2012   Left lumbar radiculopathy 05/13/2012   Irritable bowel syndrome 11/29/2011   Morbid obesity (HCC)    ALLERGIC RHINITIS, SEASONAL 06/08/2010   EDEMA LEG 03/22/2010   DERMATITIS, ATOPIC 02/09/2010   Vitamin D deficiency 04/15/2008    Past Surgical History:  Procedure Laterality Date   CESAREAN SECTION     TONSILLECTOMY AND ADENOIDECTOMY     WISDOM TOOTH EXTRACTION      OB History     Gravida  2   Para  2   Term      Preterm      AB      Living  SAB      IAB      Ectopic      Multiple      Live Births               Home Medications    Prior to Admission medications   Medication Sig Start Date End Date Taking? Authorizing Provider  amitriptyline  (ELAVIL ) 10 MG tablet Take 1 tablet (10 mg total) by mouth at bedtime. 04/30/23  Yes Curtis Debby PARAS, MD  dexlansoprazole  (DEXILANT ) 60 MG capsule Take 60 mg by mouth daily.   Yes [provider]  diclofenac  Sodium (VOLTAREN ) 1 % GEL APPLY TO AFFECTED AREA 3 TIMES A DAY 12/14/19  Yes [provider]  gabapentin  (NEURONTIN ) 600 MG tablet Take 600 mg by mouth 3 (three) times daily.   Yes [provider]  HYDROcodone -acetaminophen  (NORCO/VICODIN) 5-325 MG tablet Take 1 tablet by mouth every 6 (six) hours as needed for moderate pain.   Yes [provider]   lidocaine  (LIDODERM ) 5 % Place 1 patch onto the skin daily. Remove & Discard patch within 12 hours or as directed by MD 04/30/23  Yes Curtis Debby PARAS, MD  meloxicam  (MOBIC ) 15 MG tablet Take 1 tablet (15 mg total) by mouth daily. 08/22/23  Yes Joya Stabs, DPM  venlafaxine  XR (EFFEXOR -XR) 37.5 MG 24 hr capsule Take 1 capsule (37.5 mg total) by mouth daily with breakfast. 06/11/23  Yes Curtis Debby PARAS, MD  losartan-hydrochlorothiazide [facility]) 50-12.5 MG tablet Take by mouth. 12/03/19 08/22/23  [provider]    Family History Family History  Problem Relation Age of Onset   Diabetes Mother    Cancer Mother        Lung   Hyperlipidemia Father    Breast cancer Maternal Aunt    Ovarian cancer Paternal Aunt     Social History Social History   Tobacco Use   Smoking status: Never   Smokeless tobacco: Never  Vaping Use   Vaping status: Never Used  Substance Use Topics   Alcohol use: No   Drug use: No     Allergies   Rosuvastatin, Simvastatin, Pregabalin , Amoxicillin -pot clavulanate, Cymbalta [duloxetine hcl], Doxycycline , and Ferrous sulfate   Review of Systems Review of Systems  See HPI Physical Exam Triage Vital Signs ED Triage Vitals  Encounter Vitals Group     BP 11/06/23 0923 116/89     Girls Systolic BP Percentile --      Girls Diastolic BP Percentile --      Boys Systolic BP Percentile --      Boys Diastolic BP Percentile --      Pulse Rate 11/06/23 0923 (!) 105     Resp 11/06/23 0923 18     Temp 11/06/23 0923 98 F (36.7 C)     Temp Source 11/06/23 0923 Oral     SpO2 11/06/23 0923 99 %     Weight 11/06/23 0926 212 lb (96.2 kg)     Height 11/06/23 0926 5' (1.524 m)     Head Circumference --      Peak Flow --      Pain Score 11/06/23 0926 6     Pain Loc --      Pain Education --      Exclude from Growth Chart --    No data found.  Updated Vital Signs BP 116/89 (BP Location: Right Arm)   Pulse (!) 105   Temp 98 F (36.7 C)  (Oral)   Resp 18  Ht 5' (1.524 m)   Wt 96.2 kg   LMP 10/10/2020 (Approximate)   SpO2 99%   BMI 41.40 kg/m       Physical Exam Constitutional:      General: She is not in acute distress.    Appearance: She is well-developed. She is obese.     Comments: Is using cane  HENT:     Head: Normocephalic and atraumatic.  Eyes:     Conjunctiva/sclera: Conjunctivae normal.     Pupils: Pupils are equal, round, and reactive to light.  Cardiovascular:     Rate and Rhythm: Normal rate.  Pulmonary:     Effort: Pulmonary effort is normal. No respiratory distress.  Abdominal:     General: There is no distension.     Palpations: Abdomen is soft.  Musculoskeletal:        General: Tenderness present. Normal range of motion.     Cervical back: Normal range of motion.     Comments: Patient does have an increased Q angle.  She has patellofemoral tenderness bilaterally with crepitus on range of motion.  No joint line tenderness.  Left foot has some mild tenderness around the midfoot but otherwise appears normal.  Skin:    General: Skin is warm and dry.  Neurological:     Mental Status: She is alert.     Gait: Gait abnormal.      UC Treatments / Results  Labs (all labs ordered are listed, but only abnormal results are displayed) Labs Reviewed - No data to display  EKG   Radiology DG Foot Complete Left Result Date: 11/06/2023 CLINICAL DATA:  Left foot pain after fall several weeks ago. EXAM: LEFT FOOT - COMPLETE 3+ VIEW COMPARISON:  November 15, 2022. FINDINGS: There is no evidence of fracture or dislocation. Mild hallux valgus deformity of first metatarsophalangeal joint is again noted. Soft tissues are unremarkable. IMPRESSION: No acute abnormality seen. Electronically Signed   By: Lynwood Landy Raddle M.D.   On: 11/06/2023 10:12   DG Knee AP/LAT W/Sunrise Left Result Date: 11/06/2023 CLINICAL DATA:  Left knee pain after fall several weeks ago. EXAM: LEFT KNEE 3 VIEWS COMPARISON:  None  Available. FINDINGS: No evidence of fracture, dislocation, or joint effusion. No evidence of arthropathy or other focal bone abnormality. Soft tissues are unremarkable. IMPRESSION: Negative. Electronically Signed   By: Lynwood Landy Raddle M.D.   On: 11/06/2023 10:10    Procedures Procedures (including critical Oneal time)  Medications Ordered in UC Medications - No data to display  Initial Impression / Assessment and Plan / UC Course  I have reviewed the triage vital signs and the nursing notes.  Pertinent labs & imaging results that were available during my Oneal of the patient were reviewed by me and considered in my medical decision making (see chart for details).     I explained to the patient that the treatment for patellofemoral syndrome is physical therapy and strengthening her quadricep muscle.  I showed patient exercises she can be doing at home before she starts her physical therapy.  She has a prescription for meloxicam  which I encouraged she take.  She is given a brace.  I recommend she use her cane until she can walk with stability. X-rays are reviewed with patient Final Clinical Impressions(s) / UC Diagnoses   Final diagnoses:  Patellofemoral pain syndrome of both knees  Pain of joint of left ankle and foot  Patellar subluxation, left, initial encounter     Discharge Instructions  Take meloxicam  daily Wear brace as directed Start physical therapy when scheduled Consider sports medicine orthopedic and follow-up   ED Prescriptions   None    PDMP not reviewed this encounter.   Maranda Jamee Jacob, MD 11/06/23 1201

## 2023-11-06 NOTE — ED Triage Notes (Signed)
 Patient c/o that of left knee and foot pain for several weeks.  Patient states that her knee cap went out and she has fallen at home.  Patient did see the orthopedic yesterday and no xrays were done.  PT was recommended for patient.  Patient is already on Hydrocodone  and Meloxicam .

## 2023-11-08 ENCOUNTER — Telehealth: Payer: Self-pay

## 2023-11-08 NOTE — Telephone Encounter (Signed)
 I asked patient to return to urgent care for a proper fitting of the knee brace was able to fit her into a xxl knee hinge brace. Pt reports this feels much better.

## 2023-11-19 ENCOUNTER — Ambulatory Visit

## 2023-11-19 ENCOUNTER — Ambulatory Visit
Admission: EM | Admit: 2023-11-19 | Discharge: 2023-11-19 | Disposition: A | Discharge status: Home / Self Care | Attending: Family Medicine | Admitting: Family Medicine

## 2023-11-19 DIAGNOSIS — S99922A Unspecified injury of left foot, initial encounter: Secondary | ICD-10-CM

## 2023-11-19 DIAGNOSIS — M25572 Pain in left ankle and joints of left foot: Secondary | ICD-10-CM | POA: Diagnosis not present

## 2023-11-19 DIAGNOSIS — W19XXXA Unspecified fall, initial encounter: Secondary | ICD-10-CM | POA: Diagnosis not present

## 2023-11-19 DIAGNOSIS — M79672 Pain in left foot: Secondary | ICD-10-CM | POA: Diagnosis not present

## 2023-11-19 MED ORDER — CELECOXIB 200 MG PO CAPS
200.0000 mg | ORAL_CAPSULE | Freq: Every day | ORAL | 0 refills | Status: AC
Start: 2023-11-19 — End: 2023-12-04

## 2023-11-19 NOTE — Discharge Instructions (Addendum)
 Advised patient of left foot x-ray results with hardcopy provided.  Advised patient to RICE affected area of left foot for 30 minutes 3 times daily for the next 3 days.  Advised if symptoms worsen and/or unresolved please follow-up with your PCP, [facility] podiatry or orthopedics for further evaluation.

## 2023-11-19 NOTE — ED Triage Notes (Signed)
 Pt presents to uc with co left foot pain for 2 weeks. Pt reports she thinks she was walking on a plane and her foot got caught in the [place]. She didn't think anything at the time but since has had left foot pain. Pt reports she takes hydrocodone  for her back which is not helping.

## 2023-11-19 NOTE — ED Provider Notes (Signed)
 [name]    CSN: [id number] Arrival date & time: 11/19/23  9047      History   Chief Complaint Chief Complaint  Patient presents with   Foot Injury    HPI Rhonda Oneal is a 58 y.o. female.   HPI 58 year old female presents with left foot pain for 2 weeks.  Patient reports injuring foot while walking on [place].  Additionally, patient reports Hydrocodone  is not helping with her back pain.  Patient currently prescribed Norco 5/325 mg tablet take every 6 hours as needed for moderate pain.  PMH significant for morbid obesity, fibromyalgia, and IBS.  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   Allergic rhinitis 03/14/2020   Fibromyalgia 03/14/2020   Sciatic nerve pain 03/14/2020   Trigger thumb, left thumb 04/02/2019   Degenerative spondylolisthesis 12/07/2016   Mild intermittent asthma with exacerbation 11/05/2016   Pain of left sacroiliac joint 04/18/2016   Trochanteric bursitis of both hips 03/16/2016   Ischial bursitis 12/15/2015   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   Sickle cell trait 01/20/2013   Synovitis of foot 12/26/2012   Arthralgia 12/08/2012   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   Cervical spinal stenosis 11/12/2012   Brachial neuritis 11/12/2012   Left lumbar radiculopathy 05/13/2012   Irritable bowel syndrome 11/29/2011   Morbid obesity (HCC)    ALLERGIC RHINITIS,  SEASONAL 06/08/2010   EDEMA LEG 03/22/2010   DERMATITIS, ATOPIC 02/09/2010   Vitamin D deficiency 04/15/2008    Past Surgical History:  Procedure Laterality Date   CESAREAN SECTION     TONSILLECTOMY AND ADENOIDECTOMY     WISDOM TOOTH EXTRACTION      OB History     Gravida  2   Para  2   Term      Preterm      AB      Living         SAB      IAB      Ectopic      Multiple      Live Births               Home Medications    Prior to Admission medications   Medication Sig Start Date End Date Taking? Authorizing Provider  celecoxib  (CELEBREX ) 200 MG capsule Take 1 capsule (200 mg total) by mouth daily for 15 days. 11/19/23 12/04/23 Yes Teddy Sharper, FNP  amitriptyline  (ELAVIL ) 10 MG tablet Take 1 tablet (10 mg total) by mouth at bedtime. 04/30/23   Curtis Debby PARAS, MD  dexlansoprazole  (DEXILANT ) 60 MG capsule Take 60 mg by mouth daily.    [provider]  diclofenac  Sodium (VOLTAREN ) 1 % GEL APPLY TO AFFECTED AREA 3 TIMES A DAY 12/14/19   [provider]  gabapentin  (NEURONTIN ) 600 MG tablet Take 600 mg by mouth 3 (  three) times daily.    [provider]  HYDROcodone -acetaminophen  (NORCO/VICODIN) 5-325 MG tablet Take 1 tablet by mouth every 6 (six) hours as needed for moderate pain.    [provider]  lidocaine  (LIDODERM ) 5 % Place 1 patch onto the skin daily. Remove & Discard patch within 12 hours or as directed by MD 04/30/23   Curtis Debby PARAS, MD  losartan-hydrochlorothiazide [facility]) 50-12.5 MG tablet Take by mouth. 12/03/19 08/22/23  [provider]  venlafaxine  XR (EFFEXOR -XR) 37.5 MG 24 hr capsule Take 1 capsule (37.5 mg total) by mouth daily with breakfast. 06/11/23   Curtis Debby PARAS, MD    Family History Family History  Problem Relation Age of Onset   Diabetes Mother    Cancer Mother        Lung   Hyperlipidemia Father    Breast cancer Maternal Aunt    Ovarian cancer Paternal Aunt      Social History Social History   Tobacco Use   Smoking status: Never   Smokeless tobacco: Never  Vaping Use   Vaping status: Never Used  Substance Use Topics   Alcohol use: No   Drug use: No     Allergies   Rosuvastatin, Simvastatin, Pregabalin , Amoxicillin -pot clavulanate, Cymbalta [duloxetine hcl], Doxycycline , and Ferrous sulfate   Review of Systems Review of Systems  Musculoskeletal:        Left foot pain x 2 weeks secondary to left foot injury     Physical Exam Triage Vital Signs ED Triage Vitals  Encounter Vitals Group     BP 11/19/23 1012 (!) 131/93     Girls Systolic BP Percentile --      Girls Diastolic BP Percentile --      Boys Systolic BP Percentile --      Boys Diastolic BP Percentile --      Pulse Rate 11/19/23 1012 93     Resp 11/19/23 1012 19     Temp 11/19/23 1012 98.1 F (36.7 C)     Temp src --      SpO2 11/19/23 1012 98 %     Weight --      Height --      Head Circumference --      Peak Flow --      Pain Score 11/19/23 1010 6     Pain Loc --      Pain Education --      Exclude from Growth Chart --    No data found.  Updated Vital Signs BP (!) 131/93   Pulse 93   Temp 98.1 F (36.7 C)   Resp 19   LMP 10/10/2020 (Approximate)   SpO2 98%    Physical Exam Vitals and nursing note reviewed.  Constitutional:      Appearance: Normal appearance. She is normal weight.  HENT:     Head: Normocephalic and atraumatic.     Mouth/Throat:     Mouth: Mucous membranes are moist.     Pharynx: Oropharynx is clear.  Eyes:     Extraocular Movements: Extraocular movements intact.     Pupils: Pupils are equal, round, and reactive to light.  Cardiovascular:     Rate and Rhythm: Normal rate and regular rhythm.     Pulses: Normal pulses.     Heart sounds: Normal heart sounds.  Pulmonary:     Effort: Pulmonary effort is normal.     Breath sounds: Normal breath sounds. No wheezing, rhonchi or rales.  Musculoskeletal:  General: Normal  range of motion.  Skin:    General: Skin is warm and dry.  Neurological:     General: No focal deficit present.     Mental Status: She is alert and oriented to person, place, and time. Mental status is at baseline.  Psychiatric:        Mood and Affect: Mood normal.        Behavior: Behavior normal.        Thought Content: Thought content normal.      UC Treatments / Results  Labs (all labs ordered are listed, but only abnormal results are displayed) Labs Reviewed - No data to display  EKG   Radiology DG Foot Complete Left Result Date: 11/19/2023 CLINICAL DATA:  Fall.  Left foot injury and pain. EXAM: LEFT FOOT - COMPLETE 3+ VIEW COMPARISON:  11/06/2023 FINDINGS: There is no evidence of fracture or dislocation. Moderate hallux valgus deformity with bony bunion formation is unchanged. Mild degenerative spurring again seen along the dorsal aspect of the tarsal-metatarsal joints. Small plantar and dorsal calcaneal bone spurs also seen. IMPRESSION: No acute findings. Hallux valgus deformity with bony bunion. Electronically Signed   By: Norleen DELENA Kil M.D.   On: 11/19/2023 10:48   DG Ankle Complete Left Result Date: 11/19/2023 CLINICAL DATA:  Fall.  Left ankle injury and pain. EXAM: LEFT ANKLE COMPLETE - 3+ VIEW COMPARISON:  None Available. FINDINGS: There is no evidence of fracture, dislocation, or joint effusion. There is no evidence of arthropathy or other focal bone abnormality. Soft tissues are unremarkable. IMPRESSION: Negative. Electronically Signed   By: Norleen DELENA Kil M.D.   On: 11/19/2023 10:46    Procedures Procedures (including critical care time)  Medications Ordered in UC Medications - No data to display  Initial Impression / Assessment and Plan / UC Course  I have reviewed the triage vital signs and the nursing notes.  Pertinent labs & imaging results that were available during my care of the patient were reviewed by me and considered in my medical decision making (see  chart for details).     MDM: 1.  Left foot pain-left foot x-ray results revealed above, patient advised, instructed patient to discontinue Mobic  15 mg daily and start Celebrex  200 mg capsule daily for the next 15 days.; 2.  Left ankle pain, unspecified chronicity-left ankle x-ray results revealed above, patient advised; 3.  Injury of left foot, initial encounter-Advised patient of left foot x-ray results with hardcopy provided.  Advised patient to RICE affected area of left foot for 30 minutes 3 times daily for the next 3 days.  Advised if symptoms worsen and/or unresolved please follow-up with your PCP, [facility] podiatry or orthopedics for further evaluation.  Patient discharged home, hemodynamically stable. Final Clinical Impressions(s) / UC Diagnoses   Final diagnoses:  Left foot pain  Injury of left foot, initial encounter  Left ankle pain, unspecified chronicity     Discharge Instructions      Advised patient of left foot x-ray results with hardcopy provided.  Advised patient to RICE affected area of left foot for 30 minutes 3 times daily for the next 3 days.  Advised if symptoms worsen and/or unresolved please follow-up with your PCP, [facility] podiatry or orthopedics for further evaluation.     ED Prescriptions     Medication Sig Dispense Auth. Provider   celecoxib  (CELEBREX ) 200 MG capsule Take 1 capsule (200 mg total) by mouth daily for 15 days. 15 capsule Esmay Amspacher, FNP  I have reviewed the PDMP during this encounter.   Teddy Sharper, FNP 11/19/23 [phone]

## 2023-11-20 ENCOUNTER — Telehealth: Payer: Self-pay

## 2023-11-20 NOTE — Telephone Encounter (Signed)
Called to check on patient. No answer. No voicemail.

## 2023-12-05 ENCOUNTER — Ambulatory Visit (INDEPENDENT_AMBULATORY_CARE_PROVIDER_SITE_OTHER): Admitting: Podiatry

## 2023-12-05 ENCOUNTER — Encounter: Payer: Self-pay | Admitting: Podiatry

## 2023-12-05 DIAGNOSIS — M7662 Achilles tendinitis, left leg: Secondary | ICD-10-CM

## 2023-12-05 MED ORDER — METHYLPREDNISOLONE 4 MG PO TBPK: ORAL_TABLET | ORAL | 0 refills | Status: AC

## 2023-12-05 NOTE — Progress Notes (Signed)
  Subjective:  Patient ID: Rhonda Oneal, female    DOB: 1965/03/18,   MRN: [id number]  Chief Complaint  Patient presents with   Tendonitis    It still hurts back in the back.  It's hurting on the heel.  I hurt it on the plane four weeks ago.    58 y.o. female presents for follow-up of left achilles tendon.  She recently went on a trip to [place] and relates on the plane started to have more pain in the bottom of her heel. She has been stretching but not getting better and now having trouble with her knees.    Denies any other pedal complaints. Denies n/v/f/c.   Past Medical History:  Diagnosis Date   Allergy to mold    Arthritis    Asthma    DERMATITIS, ATOPIC 02/09/2010   Qualifier: Diagnosis of  By: Charlott MD, Reyes     EDEMA LEG 03/22/2010   Qualifier: Diagnosis of  By: Charlott MD, Reyes     Fibromyalgia    IBS (irritable bowel syndrome)    Morbid obesity (HCC)     Objective:  Physical Exam: Vascular: DP/PT pulses 2/4 bilateral. CFT <3 seconds. Normal hair growth on digits. No edema.  Skin. No lacerations or abrasions bilateral feet.  Musculoskeletal: MMT 5/5 bilateral lower extremities in DF, PF, Inversion and Eversion. Deceased ROM in DF of ankle joint.. Tender to distal achilles insertion site and some pain proximally along the tendon. No palpable delve negative thompson test. Tendon intact.  Tender to platnar medial calcaneal tubercle as well.  Neurological: Sensation intact to light touch.   Assessment:   1. Tendonitis, Achilles, left         Plan:  Patient was evaluated and treated and all questions answered. Discussed plantar fasciitis and pes planus with patient.  X-rays reviewed  from previous and discussed with patient. No acute fractures or dislocations noted. Mild spurring noted at inferior calcaneus and posterior calcaneus on left.  -Discussed Achilles insertional tendonitis and treatment options with patient.  -Medrol  dose pack sent as  well to calm thing down.  -Amb ref to PT.  -Advised on wearing ankle brace she has at home.  -Discussed if no improvement will consider MRI/PT/EPAT/PRP injections.  -Patient to return to office in 2 months for recheck.     Asberry Failing, DPM

## 2023-12-13 ENCOUNTER — Ambulatory Visit: Admitting: Podiatry
